# Patient Record
Sex: Male | Born: 1970 | Race: White | Hispanic: No | Smoking: Former smoker
Health system: Southern US, Community
[De-identification: ages and names within clinical notes are randomized; demographics above are authoritative.]

## PROBLEM LIST (undated history)

## (undated) DIAGNOSIS — K56609 Unspecified intestinal obstruction, unspecified as to partial versus complete obstruction: Secondary | ICD-10-CM

## (undated) DIAGNOSIS — K5792 Diverticulitis of intestine, part unspecified, without perforation or abscess without bleeding: Secondary | ICD-10-CM

## (undated) DIAGNOSIS — M359 Systemic involvement of connective tissue, unspecified: Secondary | ICD-10-CM

## (undated) DIAGNOSIS — Z8719 Personal history of other diseases of the digestive system: Secondary | ICD-10-CM

## (undated) HISTORY — PX: CHOLECYSTECTOMY: SHX55

## (undated) HISTORY — PX: OTHER SURGICAL HISTORY: SHX169

## (undated) HISTORY — PX: COLON RESECTION: SHX5231

---

## 1999-09-18 ENCOUNTER — Encounter (INDEPENDENT_AMBULATORY_CARE_PROVIDER_SITE_OTHER): Payer: Self-pay | Admitting: Specialist

## 1999-09-18 ENCOUNTER — Ambulatory Visit (HOSPITAL_COMMUNITY): Admission: RE | Admit: 1999-09-18 | Discharge: 1999-09-18 | Payer: Self-pay | Admitting: General Surgery

## 1999-11-11 ENCOUNTER — Encounter: Admission: RE | Admit: 1999-11-11 | Discharge: 1999-11-11 | Payer: Self-pay | Admitting: General Surgery

## 1999-11-11 ENCOUNTER — Encounter (INDEPENDENT_AMBULATORY_CARE_PROVIDER_SITE_OTHER): Payer: Self-pay | Admitting: Specialist

## 1999-11-11 ENCOUNTER — Encounter: Payer: Self-pay | Admitting: General Surgery

## 1999-12-10 ENCOUNTER — Other Ambulatory Visit: Admission: RE | Admit: 1999-12-10 | Discharge: 1999-12-10 | Payer: Self-pay | Admitting: Gastroenterology

## 2000-05-17 ENCOUNTER — Inpatient Hospital Stay (HOSPITAL_COMMUNITY): Admission: EM | Admit: 2000-05-17 | Discharge: 2000-05-18 | Payer: Self-pay | Admitting: Emergency Medicine

## 2000-05-17 ENCOUNTER — Encounter: Payer: Self-pay | Admitting: Emergency Medicine

## 2000-05-18 ENCOUNTER — Encounter: Payer: Self-pay | Admitting: *Deleted

## 2000-11-04 ENCOUNTER — Encounter: Payer: Self-pay | Admitting: Gastroenterology

## 2000-11-04 ENCOUNTER — Inpatient Hospital Stay (HOSPITAL_COMMUNITY): Admission: EM | Admit: 2000-11-04 | Discharge: 2000-11-07 | Payer: Self-pay | Admitting: Gastroenterology

## 2000-11-05 ENCOUNTER — Encounter: Payer: Self-pay | Admitting: Gastroenterology

## 2000-11-29 ENCOUNTER — Encounter (INDEPENDENT_AMBULATORY_CARE_PROVIDER_SITE_OTHER): Payer: Self-pay | Admitting: Specialist

## 2000-11-29 ENCOUNTER — Other Ambulatory Visit: Admission: RE | Admit: 2000-11-29 | Discharge: 2000-11-29 | Payer: Self-pay | Admitting: Gastroenterology

## 2002-12-17 ENCOUNTER — Encounter: Payer: Self-pay | Admitting: Emergency Medicine

## 2002-12-17 ENCOUNTER — Emergency Department (HOSPITAL_COMMUNITY): Admission: EM | Admit: 2002-12-17 | Discharge: 2002-12-17 | Payer: Self-pay | Admitting: Emergency Medicine

## 2002-12-20 ENCOUNTER — Encounter: Payer: Self-pay | Admitting: Gastroenterology

## 2002-12-20 ENCOUNTER — Encounter: Admission: RE | Admit: 2002-12-20 | Discharge: 2002-12-20 | Payer: Self-pay | Admitting: Gastroenterology

## 2002-12-25 ENCOUNTER — Observation Stay (HOSPITAL_COMMUNITY): Admission: RE | Admit: 2002-12-25 | Discharge: 2002-12-26 | Payer: Self-pay | Admitting: General Surgery

## 2002-12-25 ENCOUNTER — Encounter: Payer: Self-pay | Admitting: General Surgery

## 2002-12-25 ENCOUNTER — Encounter (INDEPENDENT_AMBULATORY_CARE_PROVIDER_SITE_OTHER): Payer: Self-pay

## 2003-05-29 ENCOUNTER — Ambulatory Visit (HOSPITAL_COMMUNITY): Admission: RE | Admit: 2003-05-29 | Discharge: 2003-05-29 | Payer: Self-pay | Admitting: Gastroenterology

## 2003-12-26 ENCOUNTER — Encounter: Admission: RE | Admit: 2003-12-26 | Discharge: 2003-12-26 | Payer: Self-pay | Admitting: Gastroenterology

## 2004-02-28 ENCOUNTER — Encounter (INDEPENDENT_AMBULATORY_CARE_PROVIDER_SITE_OTHER): Payer: Self-pay | Admitting: *Deleted

## 2004-02-28 ENCOUNTER — Ambulatory Visit (HOSPITAL_COMMUNITY): Admission: RE | Admit: 2004-02-28 | Discharge: 2004-02-28 | Payer: Self-pay | Admitting: Gastroenterology

## 2004-03-27 ENCOUNTER — Ambulatory Visit (HOSPITAL_COMMUNITY): Admission: RE | Admit: 2004-03-27 | Discharge: 2004-03-27 | Payer: Self-pay | Admitting: Gastroenterology

## 2007-09-02 ENCOUNTER — Emergency Department (HOSPITAL_COMMUNITY): Admission: EM | Admit: 2007-09-02 | Discharge: 2007-09-02 | Payer: Self-pay | Admitting: Emergency Medicine

## 2007-09-07 ENCOUNTER — Ambulatory Visit: Payer: Self-pay | Admitting: Internal Medicine

## 2008-10-09 ENCOUNTER — Encounter: Admission: RE | Admit: 2008-10-09 | Discharge: 2008-10-09 | Payer: Self-pay | Admitting: Gastroenterology

## 2009-03-22 DIAGNOSIS — R109 Unspecified abdominal pain: Secondary | ICD-10-CM

## 2009-03-22 HISTORY — DX: Unspecified abdominal pain: R10.9

## 2009-03-22 HISTORY — PX: OTHER SURGICAL HISTORY: SHX169

## 2009-03-22 HISTORY — PX: COLON SURGERY: SHX602

## 2009-06-14 ENCOUNTER — Inpatient Hospital Stay (HOSPITAL_COMMUNITY): Admission: EM | Admit: 2009-06-14 | Discharge: 2009-06-18 | Payer: Self-pay | Admitting: Emergency Medicine

## 2009-07-19 ENCOUNTER — Encounter (INDEPENDENT_AMBULATORY_CARE_PROVIDER_SITE_OTHER): Payer: Self-pay

## 2009-08-14 ENCOUNTER — Inpatient Hospital Stay (HOSPITAL_COMMUNITY): Admission: EM | Admit: 2009-08-14 | Discharge: 2009-08-18 | Payer: Self-pay | Admitting: Emergency Medicine

## 2009-08-27 ENCOUNTER — Ambulatory Visit (HOSPITAL_COMMUNITY): Admission: RE | Admit: 2009-08-27 | Discharge: 2009-08-27 | Payer: Self-pay | Admitting: General Surgery

## 2009-09-08 ENCOUNTER — Encounter (INDEPENDENT_AMBULATORY_CARE_PROVIDER_SITE_OTHER): Payer: Self-pay | Admitting: General Surgery

## 2009-09-08 ENCOUNTER — Inpatient Hospital Stay (HOSPITAL_COMMUNITY): Admission: RE | Admit: 2009-09-08 | Discharge: 2009-09-12 | Payer: Self-pay | Admitting: General Surgery

## 2009-09-25 ENCOUNTER — Encounter: Admission: RE | Admit: 2009-09-25 | Discharge: 2009-09-25 | Payer: Self-pay | Admitting: General Surgery

## 2009-10-23 ENCOUNTER — Encounter: Admission: RE | Admit: 2009-10-23 | Discharge: 2009-10-23 | Payer: Self-pay | Admitting: General Surgery

## 2009-11-20 ENCOUNTER — Inpatient Hospital Stay (HOSPITAL_COMMUNITY): Admission: RE | Admit: 2009-11-20 | Discharge: 2009-11-22 | Payer: Self-pay | Admitting: General Surgery

## 2009-12-17 ENCOUNTER — Encounter: Admission: RE | Admit: 2009-12-17 | Discharge: 2009-12-17 | Payer: Self-pay | Admitting: General Surgery

## 2010-02-26 ENCOUNTER — Inpatient Hospital Stay (HOSPITAL_COMMUNITY): Admission: EM | Admit: 2010-02-26 | Discharge: 2009-07-28 | Payer: Self-pay | Admitting: Emergency Medicine

## 2010-04-12 ENCOUNTER — Encounter: Payer: Self-pay | Admitting: General Surgery

## 2010-06-04 LAB — CBC
HCT: 44.2 % (ref 39.0–52.0)
Hemoglobin: 15 g/dL (ref 13.0–17.0)
MCH: 31.3 pg (ref 26.0–34.0)
MCHC: 33.9 g/dL (ref 30.0–36.0)
MCV: 92.1 fL (ref 78.0–100.0)
Platelets: 299 10*3/uL (ref 150–400)
RBC: 4.8 MIL/uL (ref 4.22–5.81)
RDW: 14.2 % (ref 11.5–15.5)
WBC: 7.7 10*3/uL (ref 4.0–10.5)

## 2010-06-04 LAB — DIFFERENTIAL
Basophils Absolute: 0 10*3/uL (ref 0.0–0.1)
Basophils Relative: 0 % (ref 0–1)
Eosinophils Absolute: 0.4 10*3/uL (ref 0.0–0.7)
Eosinophils Relative: 6 % — ABNORMAL HIGH (ref 0–5)
Lymphocytes Relative: 29 % (ref 12–46)
Lymphs Abs: 2.3 10*3/uL (ref 0.7–4.0)
Monocytes Absolute: 0.8 10*3/uL (ref 0.1–1.0)
Monocytes Relative: 11 % (ref 3–12)
Neutro Abs: 4.1 10*3/uL (ref 1.7–7.7)
Neutrophils Relative %: 54 % (ref 43–77)

## 2010-06-07 LAB — COMPREHENSIVE METABOLIC PANEL
ALT: 28 U/L (ref 0–53)
BUN: 7 mg/dL (ref 6–23)
CO2: 27 mEq/L (ref 19–32)
Calcium: 9.6 mg/dL (ref 8.4–10.5)
Creatinine, Ser: 1.03 mg/dL (ref 0.4–1.5)
GFR calc non Af Amer: >60 mL/min (ref >60)
Glucose, Bld: 98 mg/dL (ref 70–99)

## 2010-06-07 LAB — BASIC METABOLIC PANEL
BUN: 2 mg/dL — ABNORMAL LOW (ref 6–23)
CO2: 27 mEq/L (ref 19–32)
CO2: 27 mEq/L (ref 19–32)
Calcium: 8.4 mg/dL (ref 8.4–10.5)
Chloride: 105 mEq/L (ref 96–112)
GFR calc Af Amer: >60 mL/min (ref >60)
GFR calc non Af Amer: >60 mL/min (ref >60)
Glucose, Bld: 83 mg/dL (ref 70–99)
Potassium: 3.8 mEq/L (ref 3.5–5.1)
Sodium: 135 mEq/L (ref 135–145)

## 2010-06-07 LAB — DIFFERENTIAL
Basophils Absolute: 0 10*3/uL (ref 0.0–0.1)
Basophils Relative: 0 % (ref 0–1)
Eosinophils Absolute: 0.4 10*3/uL (ref 0.0–0.7)
Eosinophils Relative: 5 % (ref 0–5)
Lymphocytes Relative: 27 % (ref 12–46)
Lymphocytes Relative: 4 % — ABNORMAL LOW (ref 12–46)
Lymphs Abs: 0.7 10*3/uL (ref 0.7–4.0)
Lymphs Abs: 1.8 10*3/uL (ref 0.7–4.0)
Monocytes Absolute: 1 10*3/uL (ref 0.1–1.0)
Monocytes Absolute: 1.1 10*3/uL — ABNORMAL HIGH (ref 0.1–1.0)
Monocytes Relative: 11 % (ref 3–12)
Monocytes Relative: 7 % (ref 3–12)
Neutro Abs: 13.9 10*3/uL — ABNORMAL HIGH (ref 1.7–7.7)
Neutrophils Relative %: 57 % (ref 43–77)

## 2010-06-07 LAB — CBC
HCT: 33.6 % — ABNORMAL LOW (ref 39.0–52.0)
HCT: 39.1 % (ref 39.0–52.0)
Hemoglobin: 13.4 g/dL (ref 13.0–17.0)
Hemoglobin: 13.5 g/dL (ref 13.0–17.0)
MCHC: 34 g/dL (ref 30.0–36.0)
MCHC: 34.3 g/dL (ref 30.0–36.0)
MCHC: 34.4 g/dL (ref 30.0–36.0)
MCV: 92.3 fL (ref 78.0–100.0)
MCV: 93.3 fL (ref 78.0–100.0)
RBC: 4.2 MIL/uL — ABNORMAL LOW (ref 4.22–5.81)
RBC: 4.24 MIL/uL (ref 4.22–5.81)
RDW: 15.5 % (ref 11.5–15.5)

## 2010-06-08 LAB — CBC
HCT: 34.9 % — ABNORMAL LOW (ref 39.0–52.0)
HCT: 35.7 % — ABNORMAL LOW (ref 39.0–52.0)
Hemoglobin: 13.6 g/dL (ref 13.0–17.0)
MCHC: 34.2 g/dL (ref 30.0–36.0)
MCHC: 34.3 g/dL (ref 30.0–36.0)
MCV: 90.8 fL (ref 78.0–100.0)
MCV: 91.2 fL (ref 78.0–100.0)
MCV: 91.9 fL (ref 78.0–100.0)
Platelets: 365 10*3/uL (ref 150–400)
Platelets: 370 10*3/uL (ref 150–400)
Platelets: 378 10*3/uL (ref 150–400)
RBC: 3.65 MIL/uL — ABNORMAL LOW (ref 4.22–5.81)
RBC: 3.83 MIL/uL — ABNORMAL LOW (ref 4.22–5.81)
RDW: 13.7 % (ref 11.5–15.5)
RDW: 13.8 % (ref 11.5–15.5)
WBC: 11.6 10*3/uL — ABNORMAL HIGH (ref 4.0–10.5)
WBC: 7.3 10*3/uL (ref 4.0–10.5)
WBC: 9.3 10*3/uL (ref 4.0–10.5)

## 2010-06-08 LAB — BASIC METABOLIC PANEL
BUN: 5 mg/dL — ABNORMAL LOW (ref 6–23)
BUN: 8 mg/dL (ref 6–23)
CO2: 28 mEq/L (ref 19–32)
Calcium: 8.6 mg/dL (ref 8.4–10.5)
Chloride: 105 mEq/L (ref 96–112)
Creatinine, Ser: 0.84 mg/dL (ref 0.4–1.5)
Creatinine, Ser: 0.95 mg/dL (ref 0.4–1.5)
GFR calc Af Amer: >60 mL/min (ref >60)
GFR calc non Af Amer: >60 mL/min (ref >60)
Potassium: 3.9 mEq/L (ref 3.5–5.1)

## 2010-06-08 LAB — PROTIME-INR
INR: 1.13 (ref 0.00–1.49)
Prothrombin Time: 14.4 seconds (ref 11.6–15.2)

## 2010-06-08 LAB — CULTURE, ROUTINE-ABSCESS

## 2010-06-08 LAB — POCT I-STAT, CHEM 8
BUN: 10 mg/dL (ref 6–23)
Calcium, Ion: 1.17 mmol/L (ref 1.12–1.32)
Chloride: 105 mEq/L (ref 96–112)
Creatinine, Ser: 0.8 mg/dL (ref 0.4–1.5)
Glucose, Bld: 90 mg/dL (ref 70–99)
HCT: 42 % (ref 39.0–52.0)
Hemoglobin: 14.3 g/dL (ref 13.0–17.0)
Potassium: 4.2 meq/L (ref 3.5–5.1)
Sodium: 141 meq/L (ref 135–145)
TCO2: 28 mmol/L (ref 0–100)

## 2010-06-08 LAB — DIFFERENTIAL
Basophils Absolute: 0.1 10*3/uL (ref 0.0–0.1)
Basophils Relative: 1 % (ref 0–1)
Eosinophils Absolute: 0.3 10*3/uL (ref 0.0–0.7)
Monocytes Absolute: 0.9 10*3/uL (ref 0.1–1.0)
Neutro Abs: 11.9 10*3/uL — ABNORMAL HIGH (ref 1.7–7.7)

## 2010-06-08 LAB — URINALYSIS, ROUTINE W REFLEX MICROSCOPIC
Bilirubin Urine: NEGATIVE
Hgb urine dipstick: NEGATIVE
Ketones, ur: 40 mg/dL — AB
Nitrite: NEGATIVE
Urobilinogen, UA: 1 mg/dL (ref 0.0–1.0)
pH: 6.5 (ref 5.0–8.0)

## 2010-06-09 LAB — URINE CULTURE
Colony Count: NO GROWTH
Culture: NO GROWTH

## 2010-06-09 LAB — URINALYSIS, ROUTINE W REFLEX MICROSCOPIC
Glucose, UA: NEGATIVE mg/dL
Hgb urine dipstick: NEGATIVE
Protein, ur: NEGATIVE mg/dL
Specific Gravity, Urine: 1.013 (ref 1.005–1.030)
pH: 8 (ref 5.0–8.0)

## 2010-06-09 LAB — CBC
HCT: 34.4 % — ABNORMAL LOW (ref 39.0–52.0)
HCT: 36.6 % — ABNORMAL LOW (ref 39.0–52.0)
HCT: 37.3 % — ABNORMAL LOW (ref 39.0–52.0)
HCT: 37.6 % — ABNORMAL LOW (ref 39.0–52.0)
HCT: 38 % — ABNORMAL LOW (ref 39.0–52.0)
HCT: 38.4 % — ABNORMAL LOW (ref 39.0–52.0)
Hemoglobin: 11.5 g/dL — ABNORMAL LOW (ref 13.0–17.0)
Hemoglobin: 11.9 g/dL — ABNORMAL LOW (ref 13.0–17.0)
Hemoglobin: 12.5 g/dL — ABNORMAL LOW (ref 13.0–17.0)
Hemoglobin: 12.7 g/dL — ABNORMAL LOW (ref 13.0–17.0)
Hemoglobin: 12.8 g/dL — ABNORMAL LOW (ref 13.0–17.0)
Hemoglobin: 13 g/dL (ref 13.0–17.0)
Hemoglobin: 13.2 g/dL (ref 13.0–17.0)
Hemoglobin: 14.8 g/dL (ref 13.0–17.0)
MCHC: 34 g/dL (ref 30.0–36.0)
MCHC: 34.8 g/dL (ref 30.0–36.0)
MCHC: 34 g/dL (ref 30.0–36.0)
MCHC: 34.2 g/dL (ref 30.0–36.0)
MCHC: 34.3 g/dL (ref 30.0–36.0)
MCHC: 34.4 g/dL (ref 30.0–36.0)
MCV: 93.5 fL (ref 78.0–100.0)
Platelets: 296 10*3/uL (ref 150–400)
Platelets: 397 10*3/uL (ref 150–400)
RBC: 3.6 MIL/uL — ABNORMAL LOW (ref 4.22–5.81)
RBC: 3.72 MIL/uL — ABNORMAL LOW (ref 4.22–5.81)
RBC: 3.73 MIL/uL — ABNORMAL LOW (ref 4.22–5.81)
RBC: 4 MIL/uL — ABNORMAL LOW (ref 4.22–5.81)
RBC: 4.05 MIL/uL — ABNORMAL LOW (ref 4.22–5.81)
RBC: 4.13 MIL/uL — ABNORMAL LOW (ref 4.22–5.81)
RBC: 4.61 MIL/uL (ref 4.22–5.81)
RDW: 13.2 % (ref 11.5–15.5)
RDW: 13.3 % (ref 11.5–15.5)
RDW: 13.7 % (ref 11.5–15.5)
RDW: 13.7 % (ref 11.5–15.5)
RDW: 13.3 % (ref 11.5–15.5)
RDW: 13.3 % (ref 11.5–15.5)
WBC: 10.8 10*3/uL — ABNORMAL HIGH (ref 4.0–10.5)
WBC: 11.1 10*3/uL — ABNORMAL HIGH (ref 4.0–10.5)
WBC: 12.4 10*3/uL — ABNORMAL HIGH (ref 4.0–10.5)

## 2010-06-09 LAB — BASIC METABOLIC PANEL
BUN: 4 mg/dL — ABNORMAL LOW (ref 6–23)
BUN: 4 mg/dL — ABNORMAL LOW (ref 6–23)
BUN: 3 mg/dL — ABNORMAL LOW (ref 6–23)
BUN: 4 mg/dL — ABNORMAL LOW (ref 6–23)
CO2: 27 mEq/L (ref 19–32)
CO2: 30 mEq/L (ref 19–32)
CO2: 26 mEq/L (ref 19–32)
CO2: 29 mEq/L (ref 19–32)
Calcium: 8.3 mg/dL — ABNORMAL LOW (ref 8.4–10.5)
Calcium: 9.1 mg/dL (ref 8.4–10.5)
Calcium: 8.6 mg/dL (ref 8.4–10.5)
Calcium: 8.6 mg/dL (ref 8.4–10.5)
Calcium: 8.8 mg/dL (ref 8.4–10.5)
Chloride: 105 mEq/L (ref 96–112)
Chloride: 104 mEq/L (ref 96–112)
Creatinine, Ser: 0.7 mg/dL (ref 0.4–1.5)
Creatinine, Ser: 0.85 mg/dL (ref 0.4–1.5)
Creatinine, Ser: 0.85 mg/dL (ref 0.4–1.5)
GFR calc Af Amer: >60 mL/min (ref >60)
GFR calc Af Amer: >60 mL/min (ref >60)
GFR calc Af Amer: >60 mL/min (ref >60)
GFR calc Af Amer: >60 mL/min (ref >60)
GFR calc non Af Amer: >60 mL/min (ref >60)
GFR calc non Af Amer: >60 mL/min (ref >60)
GFR calc non Af Amer: >60 mL/min (ref >60)
GFR calc non Af Amer: >60 mL/min (ref >60)
GFR calc non Af Amer: >60 mL/min (ref >60)
Glucose, Bld: 106 mg/dL — ABNORMAL HIGH (ref 70–99)
Glucose, Bld: 96 mg/dL (ref 70–99)
Glucose, Bld: 98 mg/dL (ref 70–99)
Glucose, Bld: 74 mg/dL (ref 70–99)
Glucose, Bld: 80 mg/dL (ref 70–99)
Glucose, Bld: 85 mg/dL (ref 70–99)
Potassium: 3.2 mEq/L — ABNORMAL LOW (ref 3.5–5.1)
Potassium: 3.4 mEq/L — ABNORMAL LOW (ref 3.5–5.1)
Potassium: 3.6 mEq/L (ref 3.5–5.1)
Potassium: 4 mEq/L (ref 3.5–5.1)
Potassium: 4 mEq/L (ref 3.5–5.1)
Sodium: 139 mEq/L (ref 135–145)
Sodium: 141 mEq/L (ref 135–145)
Sodium: 136 mEq/L (ref 135–145)
Sodium: 139 mEq/L (ref 135–145)

## 2010-06-09 LAB — DIFFERENTIAL
Basophils Absolute: 0.2 10*3/uL — ABNORMAL HIGH (ref 0.0–0.1)
Basophils Relative: 1 % (ref 0–1)
Eosinophils Absolute: 0.3 10*3/uL (ref 0.0–0.7)
Monocytes Absolute: 1.8 10*3/uL — ABNORMAL HIGH (ref 0.1–1.0)
Monocytes Relative: 12 % (ref 3–12)
Neutro Abs: 11.2 10*3/uL — ABNORMAL HIGH (ref 1.7–7.7)
Neutrophils Relative %: 73 % (ref 43–77)

## 2010-06-09 LAB — COMPREHENSIVE METABOLIC PANEL
Albumin: 3.5 g/dL (ref 3.5–5.2)
BUN: 8 mg/dL (ref 6–23)
Calcium: 8.9 mg/dL (ref 8.4–10.5)
Creatinine, Ser: 1.13 mg/dL (ref 0.4–1.5)
Glucose, Bld: 92 mg/dL (ref 70–99)
Potassium: 3.9 mEq/L (ref 3.5–5.1)
Total Protein: 7.8 g/dL (ref 6.0–8.3)

## 2010-06-09 LAB — ABO/RH: ABO/RH(D): O POS

## 2010-06-09 LAB — POCT I-STAT, CHEM 8
BUN: 9 mg/dL (ref 6–23)
Chloride: 102 mEq/L (ref 96–112)
Creatinine, Ser: 1.2 mg/dL (ref 0.4–1.5)
Potassium: 4.2 mEq/L (ref 3.5–5.1)
Sodium: 138 mEq/L (ref 135–145)
TCO2: 28 mmol/L (ref 0–100)

## 2010-06-09 LAB — LIPASE, BLOOD: Lipase: 15 U/L (ref 11–59)

## 2010-06-09 LAB — GLUCOSE, CAPILLARY

## 2010-06-09 LAB — PHOSPHORUS: Phosphorus: 2.5 mg/dL (ref 2.3–4.6)

## 2010-06-09 LAB — MAGNESIUM: Magnesium: 1.7 mg/dL (ref 1.5–2.5)

## 2010-06-12 LAB — COMPREHENSIVE METABOLIC PANEL
ALT: 21 U/L (ref 0–53)
Alkaline Phosphatase: 52 U/L (ref 39–117)
CO2: 25 mEq/L (ref 19–32)
GFR calc non Af Amer: 58 mL/min — ABNORMAL LOW (ref >60)
Glucose, Bld: 95 mg/dL (ref 70–99)
Potassium: 3.7 mEq/L (ref 3.5–5.1)
Sodium: 134 mEq/L — ABNORMAL LOW (ref 135–145)
Total Protein: 7.7 g/dL (ref 6.0–8.3)

## 2010-06-12 LAB — CBC
HCT: 35.3 % — ABNORMAL LOW (ref 39.0–52.0)
HCT: 35.6 % — ABNORMAL LOW (ref 39.0–52.0)
HCT: 40.9 % (ref 39.0–52.0)
Hemoglobin: 12.3 g/dL — ABNORMAL LOW (ref 13.0–17.0)
Hemoglobin: 15.9 g/dL (ref 13.0–17.0)
MCHC: 34.2 g/dL (ref 30.0–36.0)
MCHC: 34.5 g/dL (ref 30.0–36.0)
MCV: 93.8 fL (ref 78.0–100.0)
MCV: 94.6 fL (ref 78.0–100.0)
Platelets: 165 10*3/uL (ref 150–400)
Platelets: 188 10*3/uL (ref 150–400)
Platelets: 203 10*3/uL (ref 150–400)
Platelets: 221 10*3/uL (ref 150–400)
RBC: 3.58 MIL/uL — ABNORMAL LOW (ref 4.22–5.81)
RBC: 4.89 MIL/uL (ref 4.22–5.81)
RDW: 13.4 % (ref 11.5–15.5)
RDW: 13.6 % (ref 11.5–15.5)
RDW: 13.6 % (ref 11.5–15.5)
WBC: 10.8 10*3/uL — ABNORMAL HIGH (ref 4.0–10.5)
WBC: 16.5 10*3/uL — ABNORMAL HIGH (ref 4.0–10.5)

## 2010-06-12 LAB — URINALYSIS, ROUTINE W REFLEX MICROSCOPIC
Bilirubin Urine: NEGATIVE
Glucose, UA: NEGATIVE mg/dL
Ketones, ur: NEGATIVE mg/dL
Leukocytes, UA: NEGATIVE
Protein, ur: 30 mg/dL — AB

## 2010-06-12 LAB — GIARDIA/CRYPTOSPORIDIUM SCREEN(EIA)
Cryptosporidium Screen (EIA): NEGATIVE
Giardia Screen - EIA: NEGATIVE

## 2010-06-12 LAB — DIFFERENTIAL
Basophils Relative: 0 % (ref 0–1)
Eosinophils Absolute: 0 10*3/uL (ref 0.0–0.7)
Monocytes Relative: 12 % (ref 3–12)
Neutrophils Relative %: 80 % — ABNORMAL HIGH (ref 43–77)

## 2010-06-12 LAB — BASIC METABOLIC PANEL
BUN: 9 mg/dL (ref 6–23)
CO2: 24 mEq/L (ref 19–32)
Calcium: 8.2 mg/dL — ABNORMAL LOW (ref 8.4–10.5)
GFR calc non Af Amer: 55 mL/min — ABNORMAL LOW (ref >60)
Glucose, Bld: 92 mg/dL (ref 70–99)

## 2010-06-12 LAB — URINE CULTURE
Colony Count: NO GROWTH
Colony Count: NO GROWTH
Culture: NO GROWTH
Culture: NO GROWTH
Special Requests: NEGATIVE

## 2010-06-12 LAB — CLOSTRIDIUM DIFFICILE EIA: C difficile Toxins A+B, EIA: NEGATIVE

## 2010-06-12 LAB — PHOSPHORUS: Phosphorus: 1.7 mg/dL — ABNORMAL LOW (ref 2.3–4.6)

## 2010-06-12 LAB — CULTURE, ROUTINE-ABSCESS

## 2010-06-12 LAB — CULTURE, BLOOD (ROUTINE X 2)

## 2010-06-12 LAB — ANAEROBIC CULTURE

## 2010-06-12 LAB — CULTURE, BLOOD (SINGLE)

## 2010-06-12 LAB — HEMOCCULT GUIAC POC 1CARD (OFFICE): Fecal Occult Bld: NEGATIVE

## 2010-08-04 NOTE — H&P (Signed)
NAMEJAMAAL, BERNASCONI                  ACCOUNT NO.:  [id number]   MEDICAL RECORD NO.:  [id number]          PATIENT TYPE:  EMS   LOCATION:  ED                           FACILITY:  [facility]   PHYSICIAN:  Iva Boop, MD,FACGDATE OF BIRTH:  [birth year]   DATE OF ADMISSION:  09/02/2007  DATE OF DISCHARGE:                              HISTORY & PHYSICAL   CHIEF COMPLAINT:  Abdominal pain.   Mr. Poage is a 40 year old white man followed by Dr. Loreta Ave  intermittently for colon polyps and irritable bowel syndrome-type  problems it sounds like.  Within the last 12 hours or so, he has  developed suprapubic and left lower quadrant and inguinal-type pain,  quite severe.  He has had some diarrhea as well.  The patient describes  similar problems over the years with evaluations that were unrevealing.  I have reviewed previous colonoscopy reports and random biopsies have  been unremarkable.  The pain is quite intense, coming and going.  There  is minimal change in urinary stream, but no dysuria.  Some nausea, no  vomiting, and no fever.  It is similar to previous problems, but it has  been a number of years that he has had it in such a severe fashion.  He  has been admitted for it before, but it is has been several years at  least.  He has felt somewhat warm, but there is no real fever noted.  He  had a scanty amount of rectal bleeding today, but he has that  chronically and intermittently due to anal fissure he tells me.   PAST MEDICAL HISTORY:  1. Questionable IBS.  2. Colon polyps.  3. History of shingles.   MEDICATIONS:  None.  There are no known drug allergies.   PAST SURGICAL HISTORY:  1. Anal fissure surgery.  2. Hemorrhoidectomy.  3. Cholecystectomy.  4. Left elbow surgery.   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   REVIEW OF SYSTEMS:  As above.  He has a rash around his ears, which  comes and goes and is flaring lately.  All other systems are negative.  Note, there were no sick contacts.   PHYSICAL EXAMINATION:  GENERAL:  A well-developed, obese, white man in  no acute distress, though he does appear mildly ill.  VITAL SIGNS:  Temperature 97.2 and 97.9, blood pressure 133/81 and  110/75 in the right and left arms respectively with pulse 82 and 67,  which is regular, respirations 20.  Room air saturation is 98%.  EYES:  Anicteric.  NECK:  Supple.  LUNGS:  Clear.  HEART:  S1 S2, no rubs, murmurs, or gallops.  BACK:  Some minimal CVA tenderness, right greater than left.  ABDOMEN:  Obese, soft, he is fairly tender in the suprapubic and left  inguinal areas and left lower quadrant and some in the right.  He seems  to be somewhat hyperesthetic.  There is no rebound or guarding per se.  GENITALIA:  Normal and  minimally tender.  The perianal area is nontender  without any sign of an abscess.  He refused a rectal exam.  LOWER EXTREMITIES:  Free of edema.  NEURO:  He is alert and oriented x3.   LABORATORY DATA:  White count 13.9, hemoglobin 15.8, platelet count 240.  His glucose is 102.  Otherwise, a BMET is normal.  LFTs are normal.  Amylase and lipase normal.  Urinalysis negative.  CT scan of the abdomen  and pelvis with contrast demonstrates wall thickening in the  rectosigmoid area with stranding in adjacent fat and associated  diverticulosis, but no abscess.  Acute abdominal series was  unremarkable.  The finding of the radiologist is the findings are most  consistent with acute diverticulitis.   ASSESSMENT:  Abdominal pain, abnormal rectosigmoid associated with  diverticulosis, suspected diverticulitis.  Typically, you do not get  diarrhea with that, but that is possible.  The other possibility is some  sort of proctosigmoiditis.  I have personally reviewed the computerized  tomography scan.  Working diagnosis is acute diverticulitis.  Plan will  be for Cipro and  Flagyl 500 mg b.i.d. x10 days, oxycodone 10 mg 1 every  4 to 6 hours p.r.n., #20 prescribed, and he is to call Dr. Kenna Gilbert office  on Monday to arrange followup in the next week or so.      Iva Boop, MD,FACG  Electronically Signed     CEG/MEDQ  D:  09/02/2007  T:  09/02/2007  Job:  [id number]   cc:   Anselmo Rod, M.D.  Fax: [phone]

## 2010-08-04 NOTE — H&P (Signed)
NAMESEARS, ORAN                  ACCOUNT NO.:  [id number]   MEDICAL RECORD NO.:  [id number]          PATIENT TYPE:  EMS   LOCATION:  ED                           FACILITY:  [facility]   PHYSICIAN:  Iva Boop, MD,FACGDATE OF BIRTH:  February 21, 1971   DATE OF ADMISSION:  09/02/2007  DATE OF DISCHARGE:                              HISTORY & PHYSICAL   ADDENDUM:  He did receive some IV Toradol after IV Dilaudid in the  emergency department.  The Toradol seemed to help him quite a bit.  He  has requested some p.o. Toradol.  I think that is reasonable, so will  prescribe some of that as well for a limited supply, #10 pills.      Iva Boop, MD,FACG  Electronically Signed     CEG/MEDQ  D:  09/02/2007  T:  09/02/2007  Job:  [id number]   cc:   Anselmo Rod, M.D.  Fax: [phone]

## 2010-08-07 NOTE — Discharge Summary (Signed)
[facility]  Patient:    [name], [name] Visit Number: [id number] MRN: [id number]          Service Type: MED Location: 3W [id number] Attending Physician:  Charmaine Downs Dictated by:   Mike Gip, P.A.C. Adm. Date:  11/04/2000 Disc. Date: 11/07/2000                             Discharge Summary  ADMISSION DIAGNOSES: 87. A 40 year old white male with acute severe lower abdominal/pelvic and    suprapubic pain radiating to the perineum and rectum x 4 days associated    with severe diarrhea and intermittent hematochezia, rule out underlying    inflammatory bowel disease with development of fistulization or pelvic    abscess, rule out other acute lower abdominal inflammatory process. 2. Irritable bowel syndrome.  DISCHARGE DIAGNOSIS:  Acute inflammatory process of sigmoid colon with pericolonic fat inflammation, rule out secondary to acute diverticulitis or focal colitis.  PROCEDURE:  CT scan of the abdomen and pelvis.  CONSULTATIONS:  None.  HISTORY OF PRESENT ILLNESS:  [name] is a 40 year old white male known to Dr. 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  He had undergone colonoscopy in September 2001, for complaints of lower abdominal pain and diarrhea with intermittent rectal bleeding.  This was a negative exam.  He did have several random biopsies taken, all of which were negative.  He also has a prior history of an anal fissure which required a fissure repair and hemorrhoidectomy per Dr. Earlene Plater in June 2001.  He is otherwise in good health.  He says that he has had acute periodic episodes of abdominal pain and diarrhea for a couple of years.  We actually have not seen him in the office at this point for several months.  He had been entered in an IBS drug study, but not have any benefit from that medication.  The patient says that he has been doing well over the past several months with just  occasional episodes of diarrhea occurring two to three times per week, and having normal bowel movements in between.  He says he also continues to have occasional small volume bright red blood per rectum, but no recent abdominal pain.  His weight has been stable.  At this time, he had abrupt onset on Monday, August 12, with excruciating lower abdominal pain which is in the suprapubic area, left lower quadrant, radiating to the perineum and internally to his rectum.  He says this pain has progressed over the past four days, and is currently unable to walk without rectal shooting pain.  He has also developed some pressure with urination, though no hematuria, no documented fever, chills, or sweats, and his appetite has been fine.  He says he has lost about 8 pounds since Monday due to onset of profuse diarrhea with 8 to 10 bowel movements per day.  He has also had continued intermittent hematochezia.  He has had some nausea from pain, but no vomiting.  He denied any recent antibiotics or any other medications, no travel, etc.  He was seen and evaluated in the office as an acute add on, felt to be acutely ill, and admitted to the hospital for pain control and further diagnostic workup.  LABORATORY DATA:  On admission, 8/16, white blood cell count was 12.1, hemoglobin 15.1, hematocrit 42.9, MCV 90.5, platelets 265.  Followup on 8/17, white blood cell count 8.9,  hemoglobin 15.3, hematocrit 44.0.  Sedimentation rate was 17.  Prothrombin time 13.1, INR 1.0, PTT 32.  Electrolytes within normal limits.  BUN 12, creatinine 1.2, albumin 4.  Liver function studies normal.  Albumin 4.1.  Urinalysis negative.  Anti-gliadin and anti-endomesial antibodies were sent off, these are still pending.  X-ray studies:  CT scan of the abdomen and pelvis on 11/04/00, initially read as negative.  Small-bowel follow-through was then ordered on 8/17, which did not show any definite small bowel abnormality.  The CT  scan was then reviewed, and felt to show sigmoid colon mural thickening associated with some soft tissue stranding in the adjacent fat in the left hemipelvis, suggestive of diverticulitis or focal colitis.  There was no evidence for pelvic abscess.  HOSPITAL COURSE:  The patient was admitted to the service of Dr. Victorino Dike. He was initially placed on a clear liquid diet.  CT scan of the abdomen and pelvis were ordered.  He was given Demerol and Phenergan for control of pain and nausea, and started on IV Unasyn.  The CT scan was initially read as negative.  By the following morning, he was continuing to complain of fairly severe pain, and Dr. Loreta Ave who was covering, decided on a small-bowel follow-through again to rule out any evidence of inflammatory bowel disease. This was done, and was negative.  By 8/18, he was feeling somewhat better with decreased pain level, though still having ongoing pain.  He also had refused to take any pain medications, as he said it changes his personality.  His diet was cautiously advanced.  He seemed to tolerate this without any difficulty, and by 8/19, was actually tolerating a low residue diet.  CT was reviewed and amended at the time of small-bowel follow-through as outlined above.  He was felt to have a sigmoid colitis versus diverticulitis.  By 8/19, he was feeling minimally improved with the pain level from 10 initially down to a 6, still complaining of some discomfort with ambulation, no further diarrhea.  He was insistent on discharge to home on the afternoon of 11/07/00, as a family member was having surgery the following day, and he was responsible for caring for his children, though we had discussed proceeding with barium enema today which he refused, and said that he would have to be knocked out for any further procedures.  Nevertheless, it was felt that he was stable, and was allowed discharge to home with instructions to follow up with Dr. Victorino Dike on August 30, at 3:15 p.m., to take it easy at home.  He was given a work excuse for the remainder of this week, as he has a very physically active job.  He  was to follow a low residue diet.  DISCHARGE MEDICATIONS: 1. Cipro 500 mg b.i.d. x 7 days. 2. Flagyl 500 mg b.i.d. x 7 days. 3. Extra Strength Tylenol p.r.n.  He was to call us in the interim if his symptoms worsened, and we would proceed with further workup. Dictated by:   Mike Gip, P.A.C. Attending Physician:  Charmaine Downs DD:  11/07/00 TD:  11/08/00 Job: [id number]

## 2010-08-07 NOTE — Op Note (Signed)
NAME:  [name], [name]                            ACCOUNT NO.:  [id number]   MEDICAL RECORD NO.:  [id number]                   PATIENT TYPE:  AMB   LOCATION:  ENDO                                 FACILITY:  [facility]   PHYSICIAN:  Anselmo Rod, M.D.               DATE OF BIRTH:  01/23/1971   DATE OF PROCEDURE:  05/29/2003  DATE OF DISCHARGE:  05/29/2003                                 OPERATIVE REPORT   PROCEDURE PERFORMED:  Esophagogastroduodenoscopy.   ENDOSCOPIST:  Anselmo Rod, M.D.   INSTRUMENT USED:  Olympus video panendoscope.   INDICATION FOR PROCEDURE:  A 40 year old white male with a history of severe  epigastric pain not improved after a laparoscopic cholecystectomy done for  dyskinesia.  Rule out peptic ulcer disease.  The patient also had guaiac-  positive stools on physical exam in the office.   PREPROCEDURE PREPARATION:  VITAL SIGNS:  The patient had stable vital signs.  NECK:  Supple.  CHEST:  Clear to auscultation.  S1, S2 regular.  ABDOMEN:  Soft with laparoscopic cholecystectomy scars present.  Epigastric  tenderness appreciated with guarding, no rebound or rigidity.  No  hepatosplenomegaly.   DESCRIPTION OF PROCEDURE:  The patient was placed in the left lateral  decubitus position and sedated with 60 mg of Demerol and 7 mg of Versed  intravenously.  Once the patient was adequately sedate and maintained on low-  flow oxygen and continuous cardiac monitoring, the Olympus video  panendoscope was advanced with the mouthpiece over the tongue into the  esophagus under direct vision.  The entire esophagus appeared normal with no  evidence of ring, stricture, masses, esophagitis, or Barrett's mucosa.  The  scope was then advanced into the stomach.  Diffuse gastritis was noted with  a small hiatal hernia on high retroflexion.  Duodenitis was noted in the  duodenal bulb.  Small bowel distal to the bulb appeared normal up to 60 cm.  The patient tolerated the procedure  well without complications.   IMPRESSION:  1. Hemorrhagic duodenitis.  2. Diffuse gastritis.  3. Small hiatal hernia.  4. Normal-appearing esophagus.  5. Normal small bowel distal to the bowel up to 60 cm.   RECOMMENDATIONS:  1. Protonix 40 mg one p.o. daily has been prescribed for the patient.  2. Avoid nonsteroidals including Goody Powders.  3. Trial of cholestyramine 4 g b.i.d. for post-cholecystectomy diarrhea.  4. Outpatient follow-up in the next two weeks or earlier if need be.                                               Anselmo Rod, M.D.    JNM/MEDQ  D:  05/31/2003  T:  06/01/2003  Job:  [id number]   cc:   Sheppard Plumber.  Earlene Plater, M.D.  [address] [place]  [place] [address]  Fax: [phone]

## 2010-08-07 NOTE — Op Note (Signed)
NAME:  [name], FRIEDEL                  ACCOUNT NO.:  [id number]   MEDICAL RECORD NO.:  [id number]          PATIENT TYPE:  AMB   LOCATION:  ENDO                         FACILITY:  [facility]   PHYSICIAN:  Anselmo Rod, M.D.  DATE OF BIRTH:  06-28-1970   DATE OF PROCEDURE:  03/27/2004  DATE OF DISCHARGE:                                 OPERATIVE REPORT   PROCEDURE PERFORMED:  Esophagogastroduodenoscopy.   ENDOSCOPIST:  Anselmo Rod, M.D.   INSTRUMENT USED:  Olympus video panendoscope.   INDICATIONS FOR PROCEDURE:  Epigastric pain with rectal bleeding in a 40-  year-old white male to rule out peptic ulcer disease, esophagitis,  gastritis, etc.   PREPROCEDURE PREPARATION:  Informed consent was procured from the patient.  The patient fasted for eight hours prior to the procedure and prepped with a  bottle of Magnesium Citrate and a gallon of GoLYTELY the night prior to the  procedure.   PREPROCEDURE PHYSICAL EXAMINATION:  VITAL SIGNS:  Stable.  NECK:  Supple.  CHEST:  Clear to auscultation.  CARDIOVASCULAR:  S1 and S2 regular.  ABDOMEN:  Soft with normal bowel sounds.   DESCRIPTION OF PROCEDURE:  The patient was placed in left lateral decubitus  position, sedated with 40 mg of Demerol and 4  mg of Versed in slow  incremental doses.  Once the patient was adequately sedated and maintained  on low flow oxygen and continuous cardiac monitoring, the Olympus video  panendoscope was advanced through the mouthpiece over the tongue and into  the esophagus under direct vision.  The entire esophagus appeared normal  with no evidence of ring, stricture, masses, esophagitis or Barrett's  mucosa.  The scope was then advanced in the stomach.  Entire gastric mucosa  and the proximal small-bowel appeared normal.  Retroflexion in the high  cardia revealed no abnormalities.   IMPRESSION:  Normal EGD.   RECOMMENDATIONS:  1.  Proceed with colonoscopy at this time to further evaluate the patient's    rectal bleeding.  Further recommendations will be made thereafter.  2.  Continue Protonix as discussed with the patient prior to the procedure.  3.  Avoid all nonsteroidals and follow antireflux measures.  4.  Further recommendations will be made after colonoscopy has been done.      Jyot   JNM/MEDQ  D:  03/27/2004  T:  03/27/2004  Job:  [id number]

## 2010-08-07 NOTE — Discharge Summary (Signed)
[place]  Patient:    [name], [name]                         MRN: [id number] Adm. Date:  [id number] Disc. Date: 05/18/00 Attending:  Alric Quan Dictator:   Abelino Derrick, P.A.C. [facility]                           Discharge Summary  DISCHARGE DIAGNOSES:  1. Chest pain of unclear etiology.  2. History of irritable bowel syndrome.  HISTORY OF PRESENT ILLNESS: The patient is a 40 year old male, former heavy smoker, who has been evaluated for irritable bowel syndrome by Dr. Victorino Dike.  He was on a study drug for this but developed some palpitations. The day of admission, May 17, 2000, he developed tight substernal chest pain while having coffee at home.  He was under stress.  HOSPITAL COURSE: He was admitted to telemetry for further evaluation.  Drug screen was positive for benzodiazepines.  CK was slightly elevated but MBs were negative.  Troponin was negative.  He has a strong family history of coronary disease, his mother had a bypass in her 83s.  Exercise Cardiolite study was obtained which was negative for ischemia with EF of 61%.  Spiral CT was obtained and showed no evidence of pulmonary embolus.  D-dimer, amylase, lipase, and TSH are all pending at the time of this dictation.  DISCHARGE MEDICATIONS: The patient is discharged on no medications.  FOLLOW-UP: He will follow up with [place] in about a week, with P.A.  LABORATORY DATA: Drug screen was positive, as noted, for benzodiazepines.  INR 0.9.  Lipid profile is pending.  TSH is pending.  CK 237, MB 1.6.  Sodium 139, potassium 3.8, BUN 17, creatinine 1.3.  WBC 7.2, hemoglobin 15.7, hematocrit 46.4; platelets 288,000.  Urinalysis unremarkable.  EKG shows sinus rhythm without acute changes.  DISCHARGE CONDITION: Stable.  FOLLOW-UP: The patient will follow up with [place] next week.  I explained to him and his wife that we were actually not able to pinpoint the etiology of his chest  pain although on talking with him at discharge I wonder if it was not esophageal spasm.  I did reassure him that it looked that it was not from his heart or from an aneurysm or from a blood clot to his lungs and that it was all reassuring.  Dr. Victorino Dike has evaluated him from a GI standpoint recently also.  DD:  05/18/00 TD:  05/19/00 Job: [id number]

## 2010-08-07 NOTE — H&P (Signed)
[facility]  Patient:    George, [name] Visit Number: [id number] MRN: [id number]          Service Type: Attending:  Ulyess Mort, M.D. [facility] Dictated by:   Mike Gip, P.A.C. Adm. Date:  11/04/00                           History and Physical  DATE OF BIRTH:  06/26/1970  CHIEF COMPLAINT:  Severe lower abdominal pain, perineal pain, and severe diarrhea x 4 days.  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  He had negative colonoscopy in September of 2001 which was done per Dr. Victorino Dike for complaints of lower abdominal pain and diarrhea with intermittent rectal bleeding.  Colonoscopy was negative.  He did have biopsies taken of his small bowel, terminal ileum, and colon, all of which were negative.  The patient also has a prior history of an anal fissure and required fissure repair and hemorrhoidectomy per Dr. Earlene Plater in June of 2001. He has otherwise been in good health.  The patient has had periodic rather acute episodes of abdominal pain and diarrhea.  We actually have not seen him for several months.  He was entered in a drug study through the office with _________ for diarrhea predominant IBS; however, he had no benefit from that drug and actually developed some chest pain while he was in the study and had to undergo cardiac evaluation, all of which was negative.  The patient says he has been doing well over the past several months with just occasional episodes of diarrhea, perhaps 2-3 times per week.  He also notes occasional small volume bright red blood per rectum, but had not been having any abdominal pain.  He says in between his episodes of diarrhea, he was having normal bowel movements.  His weight had been remaining stable.  Now with abrupt onset on Monday, August 12, with excruciating lower abdominal pain which is  in his suprapubic area and left lower quadrant radiating to the perineal area and internally to his rectum.  He says this pain has progressed over the past four days.  He is currently unable to walk without a sensation of pressure on his rectum and pain shooting into the suprapubic area.  This is a constant pain.  He has also developed some pressure with urination, thought no hematuria or air via the bladder.  He has not had any documented fever, chills, or sweats.  His appetite has been okay; however, he has lost about eight pounds since Monday due to onset of profuse diarrhea, with 8-10 loose bowel movements per day.  This has been associated with intermittent hematochezia as well.  He says he has been nauseated from pain, but not having any vomiting.  He has not had any recent antibiotics or other medications.  No travel, etc.  The patient was seen and evaluated in the office this afternoon as an acute add-on.  He was felt to be acutely ill and admitted to the hospital for pain control and further diagnostic workup with concerns for possible underlying inflammatory bowel disease with fistulization, pelvic abscess, etc.  CURRENT MEDICATIONS:  None on a regular basis.  He was called in [employer] a couple of days ago without benefit.  Also called in _________ suppositories, and ELA-Max ointment p.r.n.  ALLERGIES:  No known  drug allergies.  PAST HISTORY: 1. Status post anal fissure repair and hemorrhoidectomy, June 2001, per    Dr. Kendrick Ranch. 2. IBS.  SOCIAL HISTORY:  The patient is married.  He is employed as a Retail banker for [employer].  He is very active physically with his job. He is a smoker, 1 to 1-1/2 packs per day, and uses alcohol socially.  FAMILY HISTORY:  Negative for GI disease, specifically no inflammatory bowel disease, colon cancers, polyps, etc.  REVIEW OF SYSTEMS:  CARDIOVASCULAR:  Denies any chest pain or anginal symptoms.  PULMONARY:   Negative for cough, shortness of breath, sputum production.  GENITOURINARY:  As above.  GASTROINTESTINAL:  As above.  PHYSICAL EXAMINATION:  GENERAL:  Well-developed, healthy-appearing white male who is in pain.  He is alert and oriented x 3.  VITAL SIGNS:  Blood pressure 100/74, pulse is 74, weight is 174 pounds, temperature is 99.  HEENT:  Atraumatic, normocephalic, EOMI, PERRLA.  Sclerae anicteric.  NECK:  Supple without nodes.  No JVD or bruit.  CARDIOVASCULAR:  Regular rate and rhythm with S1 and S2, no murmur, rub, or gallop.  PULMONARY:  Clear to A&P.  ABDOMEN:  Soft.  Bowel sounds are active.  He is quite tender in the suprapubic area and left lower quadrant.  There is some guarding and early rebound.  There is no fluctuance.  No palpable mass or hepatosplenomegaly.  RECTAL:  The patient would not permit rectal exam; however, external exam revealed no evidence of obvious perirectal abscess erythema, perineal erythema, or fistulization.  He is nontender to pressure externally of the anus, rectum, and perineum.  EXTREMITIES:  Without clubbing, cyanosis, or edema.  NEUROLOGIC:  Grossly nonfocal.  LABORATORY STUDIES:  CBC done in the office today shows a WBC of 11.5, hemoglobin 15.6, hematocrit of 44.6 and MCV of 90.7.  IMPRESSION: 45. A 39 year old white male with progressive acute lower abdominal/pelvic and    suprapubic pain radiating to the perineum and rectum x 4 days.  This has    been associated with severe diarrhea and intermittent hematochezia, rule    out underlying inflammatory bowel disease with development of fistulization    and/or pelvic abscess, perirectal abscess, etc.  Rule out other acute lower    abdominal inflammatory process. 2. Irritable bowel syndrome.  PLAN:  The patient is admitted to the service of Dr. Victorino Dike for IV fluid hydration, pain control, will be covered with IV Unasyn.  Will check baseline labs.  Schedule for stat CT scan of  the abdomen, pelvis, and further plans  depending on findings of CT.  He may require surgical consultation. DD:  11/04/00 TD:  11/05/00 Job: [id number]

## 2010-08-07 NOTE — Op Note (Signed)
[facility]  Patient:    [name], [name]                         MRN: [id number] Proc. Date: 09/18/99 Adm. Date:  [id number] Attending:  Carson Myrtle                           Operative Report  PREOPERATIVE DIAGNOSES:  Anorectal pain and rectal bleeding.  POSTOPERATIVE DIAGNOSES:  Anal fissure and internal/external hemorrhoids.  PROCEDURES:  Examination under anesthesia, proctoscopy, repair of anal fissure and hemorrhoidectomy.  SURGEON:  Kendrick Ranch, M.D.  ANESTHESIA:  General.  HISTORY OF PRESENT ILLNESS:  Mr. Slemmer has had 3 months of rectal pain, 1 month of rectal bleeding. He presented in my office earlier this week and because of the amount of pain and bleeding, he was scheduled urgently. Because I was not able to examine him in the office because of the pain, he agrees and understands to the proposed procedure.  DESCRIPTION OF PROCEDURE:  The patient was brought to the operating room, general LMA anesthesia provided and he was placed in lithotomy position. A right posterior internal/external hemorrhoids was obvious. The area was prepped and draped, proctoscopy accomplished to 20 cm and was normal. The scope withdrawn, the area reprepped. Anoscopy carried out showing a minor anal fissure and with the general anesthesia, the anus dilated nicely and was no longer in spasm. A second degree right anterior and a third degree left lateral internal hemorrhoid were present. A 0.5% Marcaine with epinephrine was injected around and about the anal orifice and massaged in well. An anal stretch was performed rather than a sphincterotomy as treatment for the anal fissure. The fissure itself was cauterized. The large right posterior internal and external hemorrhoidal complex was excised as a small ellipse, undermined the edges and removed the superficial varicosity and closed the wound with running 2-0 chromic suture. The right anterior left lateral  hemorrhoids were band ligated. There was no other pathology. There was no spasm and no evidence of infection and the procedure was complete.  Gelfoam gauze and dry sterile dressing applied. The patient tolerated the procedure well. Counts were correct and he was taken to the recovery room in good condition.  Careful written and verbal instructions were given to him and his wife including Dilaudid 2 mg #30 were given to the patient and he will be seen and followed as an outpatient. DD:  09/18/99 TD:  09/19/99 Job: [id number]

## 2010-08-07 NOTE — Op Note (Signed)
NAME:  [name], [name]                  ACCOUNT NO.:  [id number]   MEDICAL RECORD NO.:  [id number]          PATIENT TYPE:  AMB   LOCATION:  ENDO                         FACILITY:  [facility]   PHYSICIAN:  Anselmo Rod, M.D.  DATE OF BIRTH:  1970/05/22   DATE OF PROCEDURE:  02/28/2004  DATE OF DISCHARGE:                                 OPERATIVE REPORT   PROCEDURE:  Colonoscopy with cold biopsies x 6.   ENDOSCOPIST:  [name], M.D.   INSTRUMENT USED:  Olympus video colonoscope.   INDICATIONS FOR PROCEDURE:  40 year old white male with a family history of  colon cancer in a paternal grandfather and a personal history of rectal  bleeding and left lower quadrant pain, rule out colonic polyps, masses, etc.  The patient has had adenomatous polyps removed in the past.   PREPROCEDURE PHYSICAL:  Patient with stable vital signs.  Neck supple.  Chest clear to auscultation.  S1 and S2 regular.  Abdomen soft with normal  bowel sounds.   DESCRIPTION OF PROCEDURE:  The patient was placed in the left lateral  decubitus position, sedated with 100 mg of Demerol and 10 mg Versed in slow  incremental doses.  Once the patient was adequately sedated, maintained on  low flow oxygen and continuous cardiac monitoring, the Olympus video  colonoscope was advanced to the rectum.  The appendiceal orifice was  visualized after multiple washes were done.  There was a large amount of  solid stool in the rectum, small lesions could have been missed.  No masses  or polyps were seen.  There was nodular mucosa in the rectosigmoid colon,  multiple biopsies were done to rule out colitis.  The terminal ileum was not  visualized.  Retroflexion in the rectum revealed small internal hemorrhoids  that may be the source of the patient's rectal bleeding.  The patient  tolerated the procedure well without complications.   IMPRESSION:  1.  Internal hemorrhoids.  2.  Nodular mucosa in rectal sigmoid colon, biopsies done,  results pending.  3.  Large amount of residual stool in the right colon, small lesions could      have been missed.  4.  Terminal ileum not visualized.   RECOMMENDATIONS:  1.  Await pathology results.  2.  Avoid nonsteroidals including aspirin for now.  3.  Outpatient follow up in the next two weeks with further recommendations.      Jyot  JNM/MEDQ  D:  02/28/2004  T:  02/28/2004  Job:  [id number]

## 2010-08-07 NOTE — Op Note (Signed)
NAME:  [name], [name]                  ACCOUNT NO.:  [id number]   MEDICAL RECORD NO.:  [id number]          PATIENT TYPE:  AMB   LOCATION:  ENDO                         FACILITY:  [facility]   PHYSICIAN:  Anselmo Rod, M.D.  DATE OF BIRTH:  03/19/1971   DATE OF PROCEDURE:  03/27/2004  DATE OF DISCHARGE:                                 OPERATIVE REPORT   PROCEDURE:  Screening colonoscopy.   ENDOSCOPIST:  Anselmo Rod, M.D.   INSTRUMENT USED:  Olympus video colonoscope.   INDICATIONS FOR PROCEDURE:  A 40 year old white male with a family history  of colon cancer and a personal history of adenomatous polyps and recent  rectal bleeding undergoing a screening colonoscopy to rule out colonic  polyps, masses, etc.  A repeat colonoscopy has been done as a previous  colonoscopy revealed a large amount of residual stool in the colon and  visualization was not adequate.   PREPROCEDURE PREPARATION:  Informed consent was procured from the patient.  The patient fasted for eight hours prior to the procedure and prepped with a  bottle of magnesium citrate and a gallon of GoLYTELY the night prior to the  procedure.   PREPROCEDURE PHYSICAL:  The patient had stable vital signs. Neck supple.  Chest clear to auscultation. S1, S2 regular. Abdomen soft with normal bowel  sounds.   DESCRIPTION OF PROCEDURE:  The patient was placed in the left lateral  decubitus position and sedated with and additional 20 mg of Demerol and 2 mg  of Versed in slow increment doses. Once the patient was adequately sedated  and maintained on low flow oxygen and continuous cardiac monitoring, the  Olympus video colonoscope was advanced from the rectum to the cecum. There  was a significant amount of residual stool in the colon and multiple washes  were done.  No masses, polyps, erosions, ulcerations or diverticula were  seen. Small internal hemorrhoids were seen on retroflexion. The appendiceal  orifice and ileocecal valve  were clearly visualized and photographed.   IMPRESSION:  1.  Normal colonoscopy up to the cecum except for small internal hemorrhoids      seen on retroflexion.  2.  No evidence of masses, polyps or diverticulosis.   RECOMMENDATIONS:  1.  A repeat colonoscopy has been recommended in the next five years unless      the patient develops abnormal      symptoms in the interim.  2.  Outpatient followup as the need arises in the future.  3.  Continue a high fiber diet with liberal fluid intake as recommended in      the past.      Jyot   JNM/MEDQ  D:  03/27/2004  T:  03/27/2004  Job:  [id number]

## 2010-08-07 NOTE — Op Note (Signed)
NAME:  [name], [name]                            ACCOUNT NO.:  [id number]   MEDICAL RECORD NO.:  [id number]                   PATIENT TYPE:  OBV   LOCATION:  0369                                 [facility]ACILITY:  [facility]   PHYSICIAN:  Timothy E. Earlene Plater, M.D.              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   PROCEDURE:  Laparoscopy with laparoscopic cholecystectomy and operative  cholangiogram.   SURGEON:  Timothy E. Earlene Plater, M.D.   ASSISTANT:  Sandria Bales. Ezzard Standing, M.D.   ANESTHESIA:  CRNA supervised M.D.   INDICATIONS FOR PROCEDURE:  Mr. Rewerts was seen urgently yesterday in the  office in extreme right upper quadrant pain. He had been seen and evaluated  in the emergency room with CT scan, small bowel series, gallbladder  ultrasound and HIDA scan. He had no uptake and no ejection from the  gallbladder. His laboratory data were normal. Because of history clinical  and presentation, he is urgently scheduled for cholecystectomy and as well  laparoscopy. He agrees and understands completely. He is identified and the  permit signed.   DESCRIPTION OF PROCEDURE:  He is taken to the operating room, placed supine,  general endotracheal anesthesia administered. The abdomen was shaved,  prepped and draped in the usual fashion. Marcaine 0.25% with epinephrine is  used prior to each incision. A vertical incision made in the infraumbilical  fold, fascia identified, opened in the midline, grasped, peritoneum entered  without complications, #1 Vicryl pursestring suture placed across the  fascial incision, the Hasson catheter placed through the incision and the  abdomen insufflated. Under direct vision, a 10 mm catheter was placed in the  mid  epigastrium, two 5 mm trocars in the right upper quadrant. Then with the  patient in the head down position, the mid abdomen, terminal ileum,  appendix, cecum and sigmoid and pelvis were all carefully scrutinized,  pictures were made and included in the chart and these were all thought to  be completely normal without signs of colitis enteritis of any nature.  Attention was turned to the gallbladder, it was grasped, placed on tension.  Overall it appeared normal in gross appearance. Careful dissection at the  base of the gallbladder revealed a small cystic duct, behind that a small  cystic artery. The cystic duct was completely dissected out, a clip placed  near the gallbladder. The cystic duct opened, the lumen was quite small.  Using the [facility] catheter, I was able to intubate the remnant of the cystic  and shoot a real time cholangiogram with 0.5% Hypaque and real-time  fluoroscopy. The entire tract filled immediately and completely and without  abnormality and the dye spilled readily into  the duodenum through the  ampulla. The catheter was removed, the stump of the cystic duct triply  clipped. The cystic artery triply clipped and divided and the gallbladder  dissected from the gallbladder bed without complication or incident. Careful  inspection revealed the gallbladder bed to be dry, irrigation was completely  clear. The gallbladder was removed through the infraumbilical incision, it  was opened and checked, there were no apparent stones. The entire specimen  submitted to pathology. Reinspection of the area was all clear, irrigant was  clear and  then all irrigant, CO2, instruments and trocars removed under direct vision.  The abdomen was flat and soft. The skin incisions were inspected, checked  and closed with subcuticular 3-0 Monocryl. Steri-Strips applied, all counts  correct. He had tolerated it well and was awakened and taken to the recovery  room in good condition.                                                Timothy E. Earlene Plater, M.D.    TED/MEDQ  D:  12/25/2002  T:  12/25/2002  Job:  [id number]

## 2010-12-17 LAB — URINALYSIS, ROUTINE W REFLEX MICROSCOPIC
Bilirubin Urine: NEGATIVE
Glucose, UA: NEGATIVE
Ketones, ur: NEGATIVE
Protein, ur: NEGATIVE
pH: 8

## 2010-12-17 LAB — COMPREHENSIVE METABOLIC PANEL
ALT: 20
Albumin: 3.5
Alkaline Phosphatase: 52
BUN: 10
Chloride: 107
Glucose, Bld: 102 — ABNORMAL HIGH
Potassium: 3.8
Sodium: 140
Total Bilirubin: 2.1 — ABNORMAL HIGH
Total Protein: 6.9

## 2010-12-17 LAB — CBC
HCT: 44.9
Hemoglobin: 15.8
Platelets: 240
RDW: 13.2
WBC: 13.9 — ABNORMAL HIGH

## 2010-12-17 LAB — AMYLASE: Amylase: 49

## 2011-12-03 IMAGING — CT CT ABCESS DRAINAGE
1 of 2 series · 15 of 32 positions shown, 19 images · non-contrast
Comparison: none

Clinical Data/Indication: RIGHT LOWER QUADRANT ABSCESS

CT GUIDED ABCESS DRAINAGE WITH CATHETER
Sedation: Versed 4 mg, Fentanyl 200 mg.
Total Moderate Sedation Time: 27 minutes.
Procedure: The procedure, risks, benefits, and alternatives were
explained to the patient. Questions regarding the procedure were
encouraged and answered. The patient understands and consents to
the procedure.
The right lower quadrant was prepped with betadine in a sterile
fashion, and a sterile drape was applied covering the operative
field. A sterile gown and sterile gloves were used for the
procedure.
Under CT guidance, a 19 gauge needle was inserted into the right
lower quadrant abscess and removed over an Amplatz.  12-French
dilator followed by 12-French drain was inserted.  It was looped
and string fixed.  5 ml frank pus was aspirated.  It was sewn to
the skin.

[Series 2: abd pelvis · axial · 0.81mm/px · z∈[-380,-260]mm · 15 of 70 slices shown, 19 images]
[im 6/70  soft-tissue]
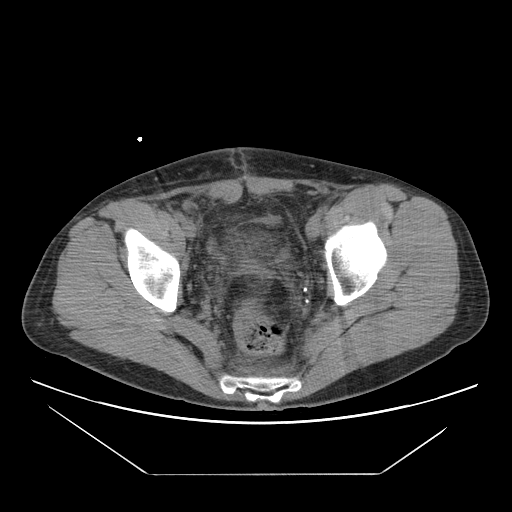
[im 6/70  bone]
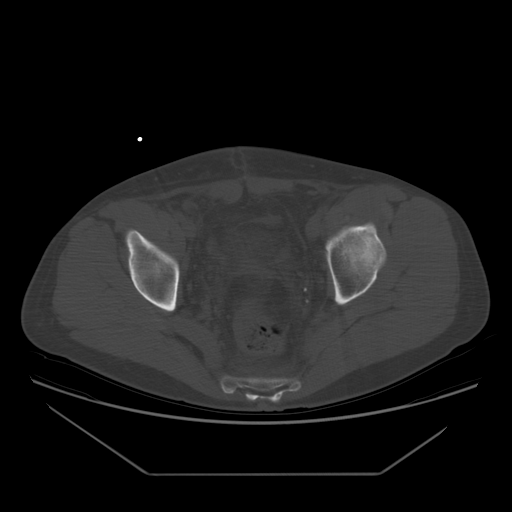
[im 11/70  soft-tissue]
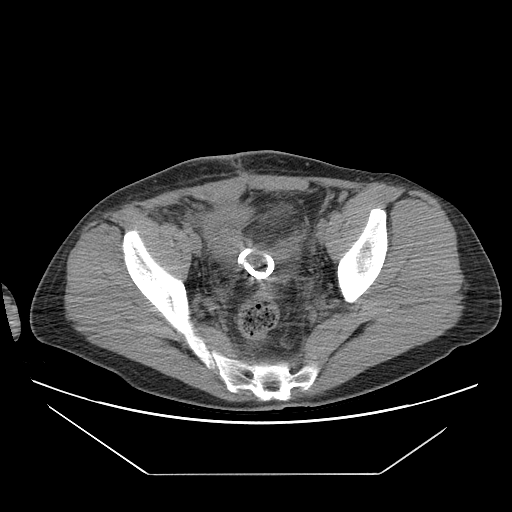
[im 16/70  soft-tissue]
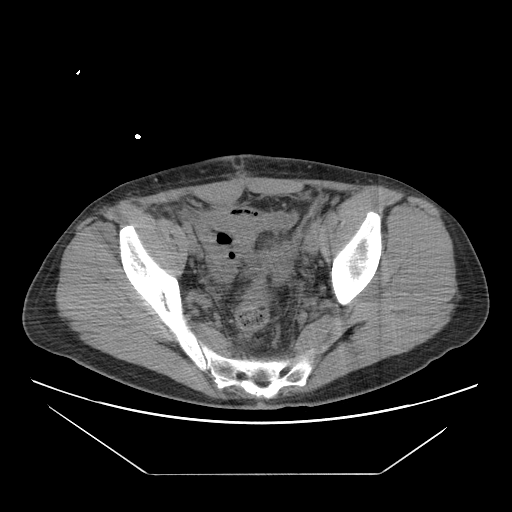
[im 21/70  soft-tissue]
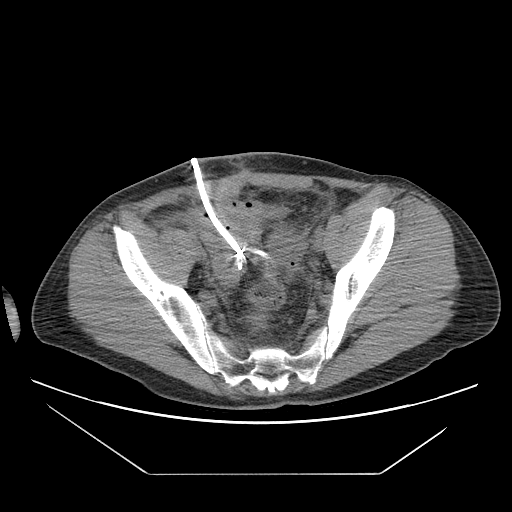
[im 26/70  soft-tissue]
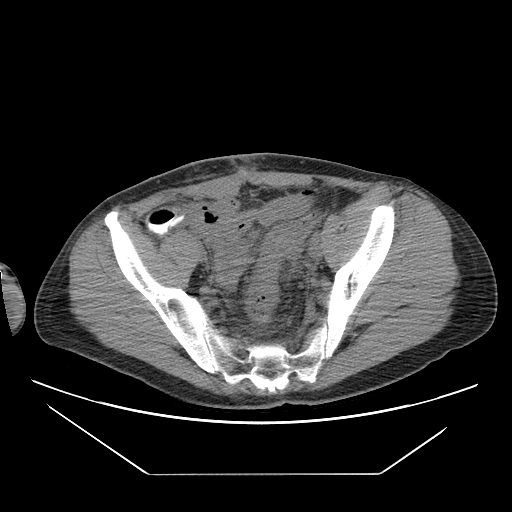
[im 31/70  soft-tissue]
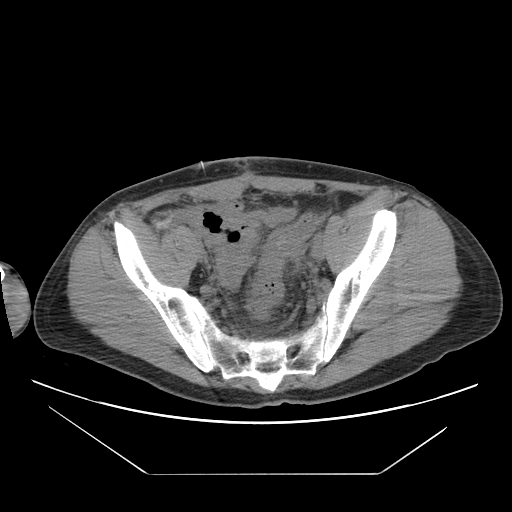
[im 36/70  soft-tissue]
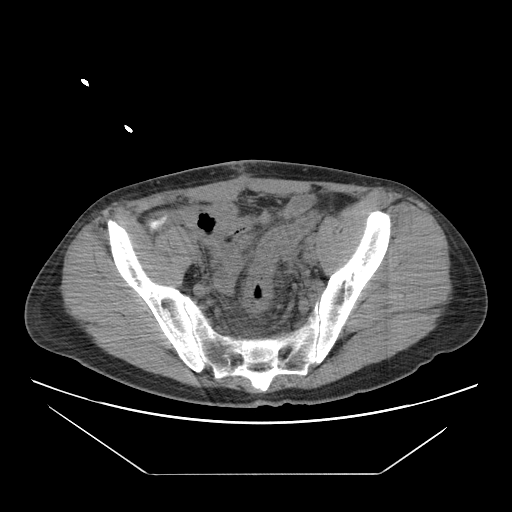
[im 41/70  soft-tissue]
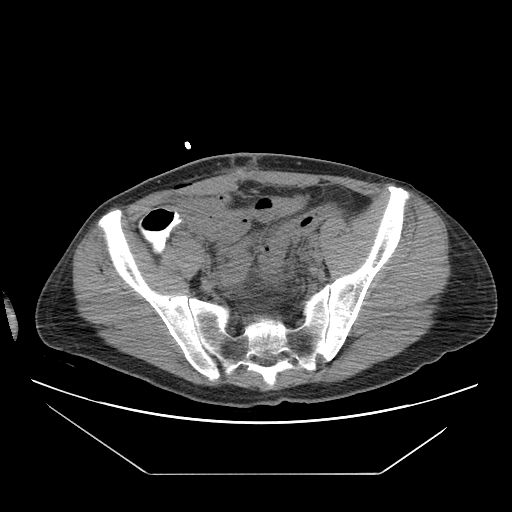
[im 47/70  soft-tissue]
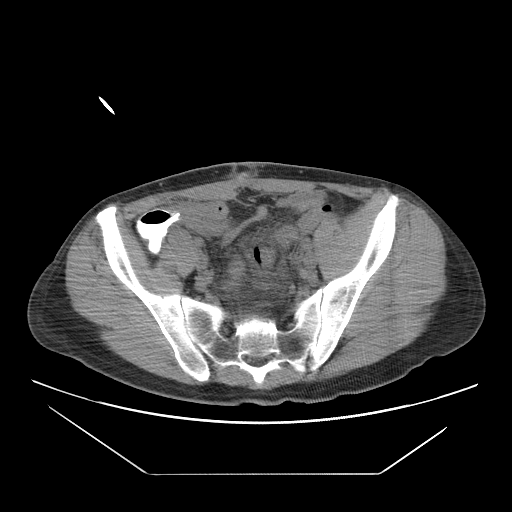
[im 47/70  bone]
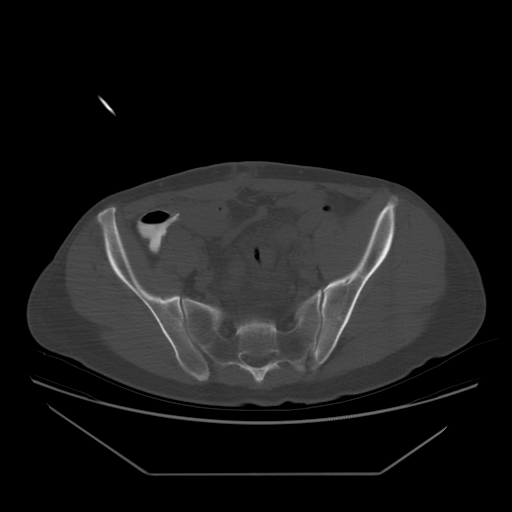
[im 52/70  soft-tissue]
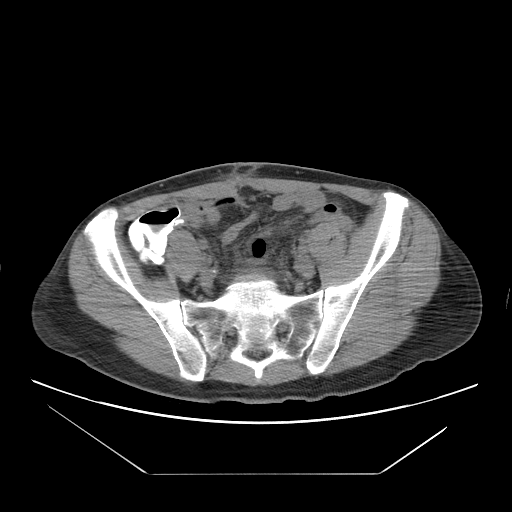
[im 57/70  soft-tissue]
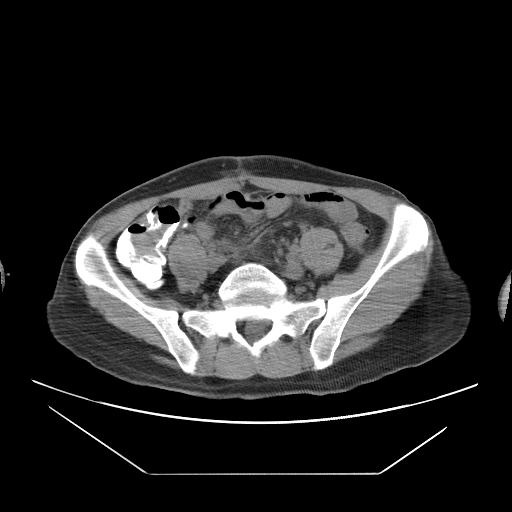
[im 59/70  lung]
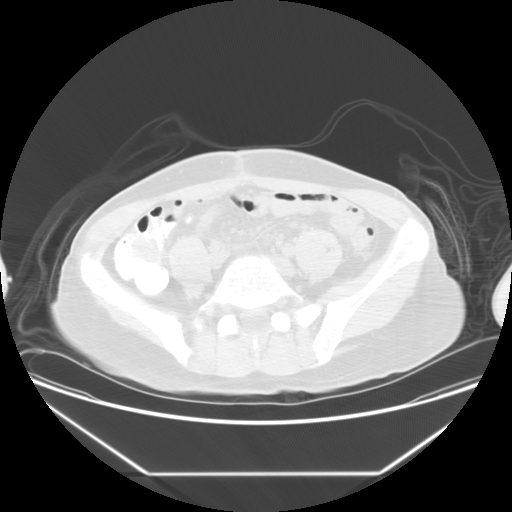
[im 62/70  soft-tissue]
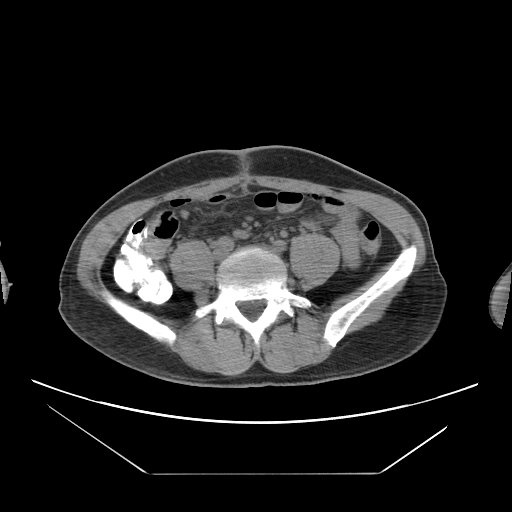
[im 62/70  lung]
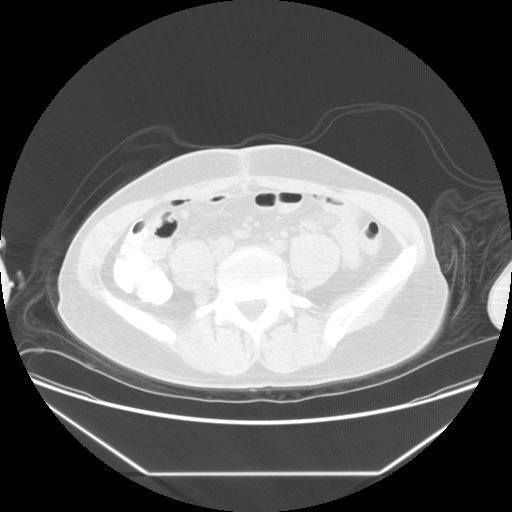
[im 64/70  lung]
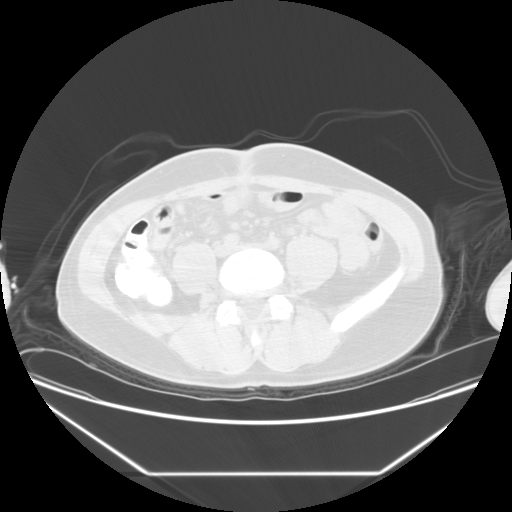
[im 67/70  soft-tissue]
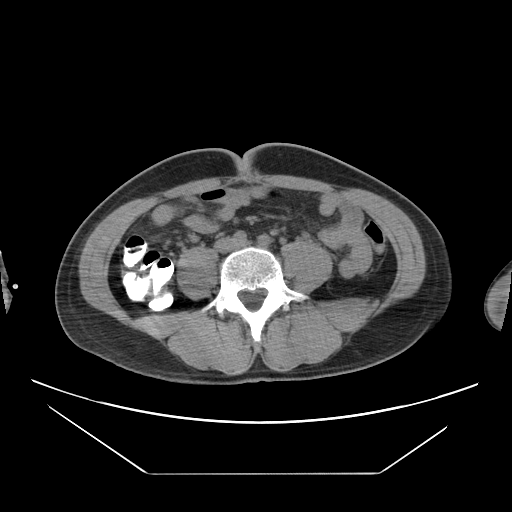
[im 67/70  lung]
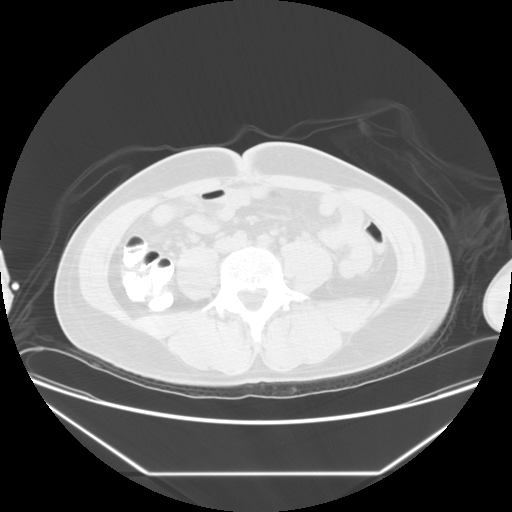

[15 of 32 positions shown; findings below may reference images not displayed]

FINDINGS: Imaging demonstrates placement of a right lower quadrant
12-French abscess drain.

Complications: None
IMPRESSION: Successful right lower quadrant abscess drain.  Frank pus was
aspirated.

## 2012-02-01 ENCOUNTER — Emergency Department: Payer: Self-pay | Admitting: Emergency Medicine

## 2012-02-01 LAB — CBC
HCT: 48.8 % (ref 40.0–52.0)
HGB: 16.8 g/dL (ref 13.0–18.0)
MCH: 31.5 pg (ref 26.0–34.0)
MCHC: 34.5 g/dL (ref 32.0–36.0)
MCV: 91 fL (ref 80–100)
Platelet: 315 x10 3/mm 3 (ref 150–440)
RBC: 5.35 x10 6/mm 3 (ref 4.40–5.90)
RDW: 13.5 % (ref 11.5–14.5)
WBC: 18 x10 3/mm 3 — ABNORMAL HIGH (ref 3.8–10.6)

## 2012-02-01 LAB — COMPREHENSIVE METABOLIC PANEL
Albumin: 4.4 g/dL (ref 3.4–5.0)
Alkaline Phosphatase: 74 U/L (ref 50–136)
Calcium, Total: 9.7 mg/dL (ref 8.5–10.1)
Co2: 21 mmol/L (ref 21–32)
Creatinine: 1.44 mg/dL — ABNORMAL HIGH (ref 0.60–1.30)
EGFR (Non-African Amer.): 59 — ABNORMAL LOW
Glucose: 166 mg/dL — ABNORMAL HIGH (ref 65–99)
Osmolality: 282 (ref 275–301)
SGPT (ALT): 25 U/L (ref 12–78)
Sodium: 139 mmol/L (ref 136–145)

## 2012-02-01 LAB — AMYLASE: Amylase: 38 U/L (ref 25–115)

## 2012-02-03 ENCOUNTER — Other Ambulatory Visit: Payer: Self-pay | Admitting: Gastroenterology

## 2012-02-03 DIAGNOSIS — R109 Unspecified abdominal pain: Secondary | ICD-10-CM

## 2012-02-04 ENCOUNTER — Ambulatory Visit
Admission: RE | Admit: 2012-02-04 | Discharge: 2012-02-04 | Disposition: A | Discharge status: Home / Self Care | Payer: 59 | Source: Ambulatory Visit | Attending: Gastroenterology | Admitting: Gastroenterology

## 2012-02-04 DIAGNOSIS — R109 Unspecified abdominal pain: Secondary | ICD-10-CM

## 2012-02-04 MED ORDER — IOHEXOL 300 MG/ML  SOLN
100.0000 mL | Freq: Once | INTRAMUSCULAR | Status: AC | PRN
  Administered 2012-02-04: 100 mL via INTRAVENOUS

## 2012-04-10 ENCOUNTER — Emergency Department: Payer: Self-pay | Admitting: Emergency Medicine

## 2012-04-10 LAB — COMPREHENSIVE METABOLIC PANEL
Alkaline Phosphatase: 69 U/L (ref 50–136)
Bilirubin,Total: 1.2 mg/dL — ABNORMAL HIGH (ref 0.2–1.0)
Calcium, Total: 8.9 mg/dL (ref 8.5–10.1)
Creatinine: 1.01 mg/dL (ref 0.60–1.30)
Glucose: 113 mg/dL — ABNORMAL HIGH (ref 65–99)
Osmolality: 278 (ref 275–301)
Potassium: 3.5 mmol/L (ref 3.5–5.1)
SGPT (ALT): 27 U/L (ref 12–78)

## 2012-04-10 LAB — CBC
HCT: 45.6 % (ref 40.0–52.0)
MCV: 90 fL (ref 80–100)
Platelet: 245 10*3/uL (ref 150–440)
RBC: 5.05 10*6/uL (ref 4.40–5.90)

## 2012-04-10 LAB — LIPASE, BLOOD: Lipase: 78 U/L (ref 73–393)

## 2012-04-24 LAB — CBC
HCT: 48 % (ref 40.0–52.0)
HGB: 16 g/dL (ref 13.0–18.0)
MCHC: 33.4 g/dL (ref 32.0–36.0)
MCV: 92 fL (ref 80–100)
RBC: 5.19 10*6/uL (ref 4.40–5.90)

## 2012-04-25 ENCOUNTER — Telehealth (INDEPENDENT_AMBULATORY_CARE_PROVIDER_SITE_OTHER): Payer: Self-pay | Admitting: General Surgery

## 2012-04-25 ENCOUNTER — Inpatient Hospital Stay: Payer: Self-pay | Admitting: Surgery

## 2012-04-25 LAB — URINALYSIS, COMPLETE
Blood: NEGATIVE
Glucose,UR: NEGATIVE mg/dL (ref 0–75)
Ketone: NEGATIVE
Leukocyte Esterase: NEGATIVE
Ph: 6 (ref 4.5–8.0)
Protein: 25
RBC,UR: 1 /HPF (ref 0–5)

## 2012-04-25 LAB — COMPREHENSIVE METABOLIC PANEL
Albumin: 4.2 g/dL (ref 3.4–5.0)
Alkaline Phosphatase: 73 U/L (ref 50–136)
BUN: 14 mg/dL (ref 7–18)
Calcium, Total: 9.9 mg/dL (ref 8.5–10.1)
Potassium: 3.8 mmol/L (ref 3.5–5.1)
Sodium: 139 mmol/L (ref 136–145)
Total Protein: 8.4 g/dL — ABNORMAL HIGH (ref 6.4–8.2)

## 2012-04-25 NOTE — Telephone Encounter (Signed)
Patient calling because he has had trouble with abdominal pain and vomiting. He is currently admitted at [facility] and the surgeon at the hospital thinks he has an obstruction and needs surgery. Since all his past abdominal surgeries have been by Dr Lindie Spruce he wants to be transferred to here and have Dr Lindie Spruce do his surgery. I spoke with Dr Lindie Spruce who advised he does not have any time for procedures on his schedule for the next two weeks with his call schedule. I advised patient he should stay at [facility]. He will let us know if he wants to leave and wait for Dr Lindie Spruce. I advised him against this.

## 2014-07-12 NOTE — H&P (Signed)
Subjective/Chief Complaint n/v    History of Present Illness mult trips to ED recently for N/V abd pain 25 emesis in 24 hrs. 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   Past Med/Surgical Hx:  Diverticulitis:   colon resection:   ALLERGIES:  No Known Allergies:   Family and Social History:   Family History Non-Contributory    Social History positive  tobacco, negative ETOH, recent divorce    + Tobacco Current (within 1 year)    Place of Living Home   Review of Systems:   Fever/Chills No    Cough No    Abdominal Pain Yes    Diarrhea No    Constipation No    Nausea/Vomiting Yes    SOB/DOE No    Chest Pain No    Dysuria No    Tolerating Diet No  Nauseated  Vomiting    Medications/Allergies Reviewed Medications/Allergies reviewed   Physical Exam:   GEN no acute distress    HEENT pink conjunctivae    NECK supple    RESP normal resp effort  clear BS  no use of accessory muscles    CARD regular rate    ABD soft  min distension, min tenderness rlq, scars    LYMPH negative neck    EXTR negative edema    SKIN normal to palpation    PSYCH alert, A+O to time, place, person, good insight   Lab Results: Hepatic:  03-Feb-14 23:37    Bilirubin, Total  1.5   Alkaline Phosphatase 73   SGPT (ALT) 23   SGOT (AST) 29   Total Protein, Serum  8.4   Albumin, Serum 4.2  Routine Chem:  03-Feb-14 23:37    Glucose, Serum  126   BUN 14   Creatinine (comp) 1.11   Sodium, Serum 139   Potassium, Serum 3.8   Chloride, Serum  108   CO2, Serum 23   Calcium (Total), Serum 9.9   Osmolality (calc) 280   eGFR (African American) >60   eGFR (Non-African American) >60 (eGFR values <61m/min/1.73 m2 may be an indication of chronic kidney disease (CKD). Calculated eGFR is useful in patients with stable renal function. The eGFR calculation will not be reliable in acutely ill  patients when serum creatinine is changing rapidly. It is not useful in  patients on dialysis. The eGFR calculation may not be applicable to patients at the low and high extremes of body sizes, pregnant women, and vegetarians.)   Result Comment POTASSIUM/AST - Slight hemolysis, interpret results with  - caution..Marland Kitchenpl  Result(s) reported on 25 Apr 2012 at 12:00AM.   Anion Gap 8   Lipase  72 (Result(s) reported on 25 Apr 2012 at 12:00AM.)  Routine Hem:  03-Feb-14 23:37    WBC (CBC)  17.3   RBC (CBC) 5.19   Hemoglobin (CBC) 16.0   Hematocrit (CBC) 48.0   Platelet Count (CBC) 312 (Result(s) reported on 24 Apr 2012 at 11:50PM.)   MCV 92   MCH 30.9   MCHC 33.4   RDW 13.4   Radiology Results: XRay:    20-Jan-14 09:13, Abdomen 3 Way Includes PA Chest   Abdomen 3 Way Includes PA Chest   REASON FOR EXAM:    Pain  COMMENTS:   May transport without cardiac monitor    PROCEDURE: DXR - DXR ABDOMEN 3-WAY (INCL PA CXR)  - Apr 10 2012  9:13AM     RESULT: The lungs are clear. Cardiac silhouette is unremarkable. Small   air-fluid levels are present within the mid abdominal region. The stomach   is nondistended. There is air and fecal material scattered through the   colon to the rectum 3 there is no free air.    IMPRESSION:   1. No acute cardiopulmonary disease.  2. No definite abnormal bowel distention. There are some air-fluid levels   present. Correlate for focal ileus. No findings of bowel perforation.  Dictation Site: 2        Verified By: Sundra Aland, M.D., MD  CT:    12-Nov-13 10:41, CT Abdomen and Pelvis Without Contrast   CT Abdomen and Pelvis Without Contrast   REASON FOR EXAM:    (1) pain diffuse and vomiting; (2) pain  COMMENTS:   May transport without cardiac monitor    PROCEDURE: CT  - CT ABDOMEN AND PELVIS W0  - Feb 01 2012 10:41AM     RESULT: Comparison: None    Technique: Multiple axial images from the lung bases to the symphysis   pubis were obtained  without oral and without intravenous contrast.    Findings:  Minimal basilar opacities are likely secondary to atelectasis.    Lack of intravenous contrast limits evaluation of the solid abdominal   organs.  Grossly, the liver, spleen, adrenals, and pancreas are     unremarkable. Surgical clips are seen from prior cholecystectomy. Mild   thickening of the distal esophagus may be related to a hiatal hernia   and/or underdistention.    No renal calculi or hydronephrosis. No ureterectasis. There are multiple   bowel suture lines in the right in the abdomen. There are multiple loops   of mild dilated fluid-filled bowel. The terminal ileum is decompressed.   There is fluid in the ascending colon.The remainder of the colon is   decompressed. There is a trace amount of free fluid in the pelvis.    No aggressive lytic or sclerotic osseous lesions are identified.    IMPRESSION:   1. There are multiple fluid-filled mildly dilated small bowel loops with   decompressed terminal ileum. Findings may represent ileus versus early or     partial small bowel obstruction.  2. Thickening of the distal esophagus may be secondary to underdistention   and/or possible small hiatal hernia. Further evaluation could be provided   with endoscopy, as indicated.          Verified By: Gregor Hams, M.D., MD     Assessment/Admission Diagnosis SBO, anastamotic? may need oral contrast CT  admit, hydrate, NG   Electronic Signatures: Florene Glen (MD)  (Signed 04-Feb-14 04:02)  Authored: CHIEF COMPLAINT and HISTORY, PAST MEDICAL/SURGIAL HISTORY, ALLERGIES, FAMILY AND SOCIAL HISTORY, REVIEW OF SYSTEMS, PHYSICAL EXAM, LABS, Radiology, ASSESSMENT AND PLAN   Last Updated: 04-Feb-14 04:02 by Florene Glen (MD)

## 2014-07-12 NOTE — Discharge Summary (Signed)
PATIENT NAME:  George OrganHARPER, Antwuan P MR#:  [id number] DATE OF BIRTH:  03/16/1971  DATE OF ADMISSION:  04/25/2012 DATE OF DISCHARGE:  04/25/2012  DIAGNOSIS: Partial small bowel obstruction, resolved.   PROCEDURES: None.   CONSULTANTS: None.   HISTORY OF PRESENT ILLNESS/HOSPITAL COURSE: This is a patient with multiple episodes of small bowel obstruction, which has resolved spontaneously. He presents again to the Emergency Room with abdominal pain and multiple emesis. A CT scan done with no contrast demonstrated a possible anastomotic stricture from prior colon resection and ileostomy and ileostomy closure. A repeat CT scan done later in the day with oral contrast demonstrated that the contrast flowed through into the colon. He spontaneously started passing gas and having bowel movements and feels like his pain is completely gone at this point. He feels back to normal and he is asking for discharge.   His exam at the time of discharge demonstrated no sign of a bowel obstruction. CT scan was reviewed personally.   At this point the patient is discharged in stable condition. A nasogastric tube will be removed prior to discharge and will be started on clear liquids. He is instructed to follow up in my office by telephone and we will obtain a small bowel follow-through as an outpatient and then see him in the office. I offered to see him myself versus having him go [facility] and he wishes to come and see me for definitive therapy, which he will ultimately need because this has recurred so many times and it appears that this is an anastomotic process and will probably not resolve spontaneously with any long-lasting effects. I discussed with him the probable need for resection, but the small bowel follow-through study would be of value in this patient. He understood and agreed to proceed. No new medications were given to him on discharge. He is instructed to restart any home medications.     ____________________________ Adah Salvageichard E. Excell Seltzerooper, MD rec:aw D: 04/25/2012 21:11:37 ET T: 04/26/2012 07:13:38 ET JOB#: [id number]  cc: Adah Salvageichard E. Excell Seltzerooper, MD, <Dictator> Lattie HawICHARD E Coleman Kalas MD ELECTRONICALLY SIGNED 04/27/2012 1:24

## 2014-07-12 NOTE — H&P (Signed)
PATIENT NAME:  [name], George P MR#:  [id number] DATE OF BIRTH:  05-Oct-1970  DATE OF ADMISSION:  04/25/2012  CHIEF COMPLAINT: Nausea, vomiting and abdominal pain.   HISTORY OF PRESENT ILLNESS: This is a 44 year old Caucasian male patient with a history of multiple episodes of nausea, vomiting and abdominal pain. He has been to the Emergency Room multiple times and had CT scans and films done in the past suggestive of an early partial small bowel obstruction. This time he came in with 25 emeses, 24 hours of epigastric pain, no flatus and a single bowel movement yesterday. A workup has suggested bowel obstruction. Dr. Manson PasseyBrown and I discussed the patient's admission based on these findings.  The patient describes epigastric pain, nausea and vomiting, no fevers or chills, and multiple episodes like this which resolved.   PAST MEDICAL HISTORY: None.   PAST SURGICAL HISTORY: Apparent large bowel obstruction of unclear etiology in which he had "3 feet of colon" removed. 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 [facility] years ago.   ALLERGIES: No known drug allergies.   MEDICATIONS: None.   FAMILY HISTORY: Noncontributory.   SOCIAL HISTORY: The patient smokes tobacco. He does not drink alcohol. He is employed but not insured. He is worried about coming into the Emergency Room and being admitted to the hospital because of his lack of insurance. He is recently divorced.  REVIEW OF SYSTEMS: A 10 system review is performed and negative with the exception of that mentioned in the HPI.  PHYSICAL EXAMINATION: GENERAL: Comfortable-appearing Caucasian male patient.  VITAL SIGNS: He is afebrile with a temp of 97.9, pulse of 58, respirations of 20, blood pressure 125/80, 96% room air sat and pain scale of 4.  HEENT: No scleral icterus.  NECK: No palpable neck nodes.  LUNGS: Clear to auscultation.  HEART: Regular rate and rhythm.  ABDOMEN: Abdomen shows a  midline infraumbilical scar and a right-sided transverse scar, both healed. No groin hernias are noted. Abdomen is nondistended, nontympanitic but tender in the right lower quadrant.  EXTREMITIES: No edema.  NEUROLOGIC: Grossly intact.  INTEGUMENT: No jaundice.   LABS/RADIOLOGIC STUDIES:  CT scan is personally reviewed suggesting an anastomotic stricture or obstruction and a staple line with dilated loops proximal.   Electrolytes are within normal limits with the exception of glucose of 126. Bilirubin is 1.5. White blood cell count is 17.3 and H and H are 16 and 48.   ASSESSMENT AND PLAN: This is a patient with a small bowel obstruction following a colon resection for unclear etiology as well as an ileostomy closure. He requires admission because of his multiple emeses, a nasogastric tube will be placed, he will be hydrated and films repeated. I will discuss this case with Dr. Egbert GaribaldiBird in the morning. I have discussed it with Dr. Manson PasseyBrown in the ED and the patient was in agreement with this plan. ____________________________ Adah Salvageichard E. Excell Seltzerooper, MD rec:sb D: 04/25/2012 04:10:20 ET T: 04/25/2012 11:04:17 ET JOB#: [id number]  cc: Adah Salvageichard E. Excell Seltzerooper, MD, <Dictator> Lattie HawICHARD E Homero Hyson MD ELECTRONICALLY SIGNED 04/27/2012 1:24

## 2014-12-02 ENCOUNTER — Inpatient Hospital Stay (HOSPITAL_COMMUNITY): Payer: BLUE CROSS/BLUE SHIELD

## 2014-12-02 ENCOUNTER — Inpatient Hospital Stay (HOSPITAL_COMMUNITY)
Admission: EM | Admit: 2014-12-02 | Discharge: 2014-12-03 | DRG: 390 | Disposition: A | Discharge status: Home / Self Care | Payer: BLUE CROSS/BLUE SHIELD | Attending: Internal Medicine | Admitting: Internal Medicine

## 2014-12-02 ENCOUNTER — Emergency Department (HOSPITAL_COMMUNITY): Payer: BLUE CROSS/BLUE SHIELD

## 2014-12-02 ENCOUNTER — Encounter (HOSPITAL_COMMUNITY): Payer: Self-pay | Admitting: Emergency Medicine

## 2014-12-02 DIAGNOSIS — K56609 Unspecified intestinal obstruction, unspecified as to partial versus complete obstruction: Secondary | ICD-10-CM

## 2014-12-02 DIAGNOSIS — E876 Hypokalemia: Secondary | ICD-10-CM | POA: Diagnosis present

## 2014-12-02 DIAGNOSIS — K5660 Unspecified intestinal obstruction: Secondary | ICD-10-CM | POA: Diagnosis not present

## 2014-12-02 DIAGNOSIS — K5669 Other intestinal obstruction: Secondary | ICD-10-CM | POA: Diagnosis not present

## 2014-12-02 DIAGNOSIS — Z9049 Acquired absence of other specified parts of digestive tract: Secondary | ICD-10-CM | POA: Diagnosis present

## 2014-12-02 DIAGNOSIS — R110 Nausea: Secondary | ICD-10-CM | POA: Diagnosis present

## 2014-12-02 DIAGNOSIS — F17210 Nicotine dependence, cigarettes, uncomplicated: Secondary | ICD-10-CM | POA: Diagnosis present

## 2014-12-02 HISTORY — DX: Diverticulitis of intestine, part unspecified, without perforation or abscess without bleeding: K57.92

## 2014-12-02 LAB — COMPREHENSIVE METABOLIC PANEL
ALT: 16 U/L — AB (ref 17–63)
AST: 18 U/L (ref 15–41)
Albumin: 4 g/dL (ref 3.5–5.0)
Alkaline Phosphatase: 46 U/L (ref 38–126)
Anion gap: 10 (ref 5–15)
BUN: 12 mg/dL (ref 6–20)
CHLORIDE: 103 mmol/L (ref 101–111)
CO2: 23 mmol/L (ref 22–32)
CREATININE: 1.21 mg/dL (ref 0.61–1.24)
Calcium: 9.1 mg/dL (ref 8.9–10.3)
Glucose, Bld: 132 mg/dL — ABNORMAL HIGH (ref 65–99)
Potassium: 3.3 mmol/L — ABNORMAL LOW (ref 3.5–5.1)
Sodium: 136 mmol/L (ref 135–145)
Total Bilirubin: 1.3 mg/dL — ABNORMAL HIGH (ref 0.3–1.2)
Total Protein: 7.2 g/dL (ref 6.5–8.1)

## 2014-12-02 LAB — URINALYSIS, DIPSTICK ONLY
BILIRUBIN URINE: NEGATIVE
Glucose, UA: NEGATIVE mg/dL
HGB URINE DIPSTICK: NEGATIVE
KETONES UR: NEGATIVE mg/dL
Leukocytes, UA: NEGATIVE
NITRITE: NEGATIVE
PROTEIN: NEGATIVE mg/dL
Specific Gravity, Urine: 1.008 (ref 1.005–1.030)
UROBILINOGEN UA: 0.2 mg/dL (ref 0.0–1.0)
pH: 6 (ref 5.0–8.0)

## 2014-12-02 LAB — CBC
HEMATOCRIT: 40.5 % (ref 39.0–52.0)
Hemoglobin: 13.9 g/dL (ref 13.0–17.0)
MCH: 31.9 pg (ref 26.0–34.0)
MCHC: 34.3 g/dL (ref 30.0–36.0)
MCV: 92.9 fL (ref 78.0–100.0)
Platelets: 226 10*3/uL (ref 150–400)
RBC: 4.36 MIL/uL (ref 4.22–5.81)
RDW: 13.2 % (ref 11.5–15.5)
WBC: 7.4 10*3/uL (ref 4.0–10.5)

## 2014-12-02 LAB — CBC WITH DIFFERENTIAL/PLATELET
BASOS ABS: 0 10*3/uL (ref 0.0–0.1)
BASOS PCT: 0 % (ref 0–1)
EOS ABS: 0.1 10*3/uL (ref 0.0–0.7)
EOS PCT: 1 % (ref 0–5)
HCT: 41.6 % (ref 39.0–52.0)
Hemoglobin: 14.3 g/dL (ref 13.0–17.0)
LYMPHS PCT: 11 % — AB (ref 12–46)
Lymphs Abs: 1.4 10*3/uL (ref 0.7–4.0)
MCH: 31 pg (ref 26.0–34.0)
MCHC: 34.4 g/dL (ref 30.0–36.0)
MCV: 90.2 fL (ref 78.0–100.0)
Monocytes Absolute: 1.1 10*3/uL — ABNORMAL HIGH (ref 0.1–1.0)
Monocytes Relative: 9 % (ref 3–12)
Neutro Abs: 9.7 10*3/uL — ABNORMAL HIGH (ref 1.7–7.7)
Neutrophils Relative %: 79 % — ABNORMAL HIGH (ref 43–77)
PLATELETS: 254 10*3/uL (ref 150–400)
RBC: 4.61 MIL/uL (ref 4.22–5.81)
RDW: 13 % (ref 11.5–15.5)
WBC: 12.3 10*3/uL — AB (ref 4.0–10.5)

## 2014-12-02 LAB — CREATININE, SERUM
Creatinine, Ser: 1.17 mg/dL (ref 0.61–1.24)
GFR calc Af Amer: >60 mL/min (ref >60)
GFR calc non Af Amer: >60 mL/min (ref >60)

## 2014-12-02 LAB — URINALYSIS, ROUTINE W REFLEX MICROSCOPIC
BILIRUBIN URINE: NEGATIVE
Glucose, UA: NEGATIVE mg/dL
HGB URINE DIPSTICK: NEGATIVE
Ketones, ur: NEGATIVE mg/dL
Leukocytes, UA: NEGATIVE
Nitrite: NEGATIVE
PROTEIN: NEGATIVE mg/dL
Specific Gravity, Urine: 1.017 (ref 1.005–1.030)
UROBILINOGEN UA: 0.2 mg/dL (ref 0.0–1.0)
pH: 6.5 (ref 5.0–8.0)

## 2014-12-02 LAB — LIPASE, BLOOD: LIPASE: 21 U/L — AB (ref 22–51)

## 2014-12-02 LAB — PHOSPHORUS: Phosphorus: 2.4 mg/dL — ABNORMAL LOW (ref 2.5–4.6)

## 2014-12-02 LAB — MAGNESIUM: Magnesium: 2 mg/dL (ref 1.7–2.4)

## 2014-12-02 MED ORDER — POTASSIUM CHLORIDE IN NACL 20-0.9 MEQ/L-% IV SOLN
INTRAVENOUS | Status: DC
  Administered 2014-12-02 – 2014-12-03 (×4): via INTRAVENOUS
  Filled 2014-12-02 (×6): qty 1000

## 2014-12-02 MED ORDER — NICOTINE 21 MG/24HR TD PT24
21.0000 mg | MEDICATED_PATCH | Freq: Every day | TRANSDERMAL | Status: DC
  Administered 2014-12-02: 21 mg via TRANSDERMAL
  Filled 2014-12-02 (×2): qty 1

## 2014-12-02 MED ORDER — IOHEXOL 300 MG/ML  SOLN
100.0000 mL | Freq: Once | INTRAMUSCULAR | Status: AC | PRN
  Administered 2014-12-02: 100 mL via INTRAVENOUS

## 2014-12-02 MED ORDER — SODIUM CHLORIDE 0.9 % IV SOLN: INTRAVENOUS | Status: DC

## 2014-12-02 MED ORDER — ONDANSETRON HCL 4 MG PO TABS: 4.0000 mg | ORAL_TABLET | Freq: Four times a day (QID) | ORAL | Status: DC | PRN

## 2014-12-02 MED ORDER — ENOXAPARIN SODIUM 40 MG/0.4ML ~~LOC~~ SOLN
40.0000 mg | SUBCUTANEOUS | Status: DC
  Filled 2014-12-02 (×3): qty 0.4

## 2014-12-02 MED ORDER — PROMETHAZINE HCL 25 MG/ML IJ SOLN
25.0000 mg | Freq: Once | INTRAMUSCULAR | Status: AC
  Administered 2014-12-02: 25 mg via INTRAVENOUS
  Filled 2014-12-02: qty 1

## 2014-12-02 MED ORDER — LIDOCAINE VISCOUS 2 % MT SOLN
15.0000 mL | Freq: Once | OROMUCOSAL | Status: AC
  Administered 2014-12-02: 15 mL via OROMUCOSAL
  Filled 2014-12-02: qty 15

## 2014-12-02 MED ORDER — MORPHINE SULFATE (PF) 2 MG/ML IV SOLN
2.0000 mg | INTRAVENOUS | Status: DC | PRN
  Administered 2014-12-02: 2 mg via INTRAVENOUS
  Filled 2014-12-02: qty 1

## 2014-12-02 MED ORDER — HYDROMORPHONE HCL 1 MG/ML IJ SOLN
1.0000 mg | INTRAMUSCULAR | Status: DC | PRN
  Administered 2014-12-03: 1 mg via INTRAVENOUS
  Filled 2014-12-02: qty 1

## 2014-12-02 MED ORDER — ONDANSETRON HCL 4 MG/2ML IJ SOLN: 4.0000 mg | Freq: Four times a day (QID) | INTRAMUSCULAR | Status: DC | PRN

## 2014-12-02 MED ORDER — KCL IN DEXTROSE-NACL 20-5-0.9 MEQ/L-%-% IV SOLN
INTRAVENOUS | Status: DC
  Filled 2014-12-02 (×6): qty 1000

## 2014-12-02 MED ORDER — SODIUM CHLORIDE 0.9 % IV BOLUS (SEPSIS)
1000.0000 mL | Freq: Once | INTRAVENOUS | Status: AC
  Administered 2014-12-02: 1000 mL via INTRAVENOUS

## 2014-12-02 MED ORDER — HYDROMORPHONE HCL 1 MG/ML IJ SOLN
1.0000 mg | Freq: Once | INTRAMUSCULAR | Status: AC
  Administered 2014-12-02: 1 mg via INTRAVENOUS
  Filled 2014-12-02: qty 1

## 2014-12-02 NOTE — ED Notes (Signed)
Patient returned from CT. Requested urinal in order to urinate. No acute distress noted.

## 2014-12-02 NOTE — ED Provider Notes (Signed)
TIME SEEN: 2:02 AM   CHIEF COMPLAINT: Abdominal Pain  HPI: HPI Comments: [name] is a 44 y.o. male who presents to the Emergency Department complaining of severe abdominal pain onset 1 day prior at [facility]. Pt reports associated n/v/d and chills. Pt reports that his last normal BM was 1 day prior.  Pt reports he has had a cholecystectomy, previous colectomy and colostomy bag has been reversed. Pt denies fever, dysuria or hematuria. Patient reports he has been passing gas.  GI is Dr. Loreta Ave with [facility]   ROS: See HPI Constitutional: no fever  Eyes: no drainage  ENT: no runny nose   Cardiovascular:  no chest pain  Resp: no SOB  GI: no vomiting GU: no dysuria Integumentary: no rash  Allergy: no hives  Musculoskeletal: no leg swelling  Neurological: no slurred speech ROS otherwise negative  PAST MEDICAL HISTORY/PAST SURGICAL HISTORY:  Past Medical History  Diagnosis Date  . SBO (small bowel obstruction)     MEDICATIONS:  Prior to Admission medications   Not on File    ALLERGIES:  No Known Allergies  SOCIAL HISTORY:  Social History  Substance Use Topics  . Smoking status: Current Every Day Smoker  . Smokeless tobacco: Not on file  . Alcohol Use: No    FAMILY HISTORY: No family history on file.  EXAM: BP 123/90 mmHg  Pulse 76  Temp(Src) 97.8 F (36.6 C) (Oral)  Resp 26  Ht 6' (1.829 m)  Wt 193 lb (87.544 kg)  BMI 26.17 kg/m2  SpO2 98%   CONSTITUTIONAL: Alert and oriented and responds appropriately to questions. Appears very uncomfortable, afebrile and nontoxic HEAD: Normocephalic EYES: Conjunctivae clear, PERRL ENT: normal nose; no rhinorrhea; moist mucous membranes; pharynx without lesions noted NECK: Supple, no meningismus, no LAD  CARD: RRR; S1 and S2 appreciated; no murmurs, no clicks, no rubs, no gallops RESP: Normal chest excursion without splinting or tachypnea; breath sounds clear and equal bilaterally; no wheezes, no rhonchi, no rales, no  hypoxia or respiratory distress, speaking full sentences ABD/GI: Normal bowel sounds; non-distended; soft, diffusely tender throughout the abdomen with voluntary guarding, no rebound, no peritoneal signs BACK:  The back appears normal and is non-tender to palpation, there is no CVA tenderness EXT: Normal ROM in all joints; non-tender to palpation; no edema; normal capillary refill; no cyanosis, no calf tenderness or swelling    SKIN: Normal color for age and race; warm NEURO: Moves all extremities equally, sensation to light touch intact diffusely, cranial nerves II through XII intact PSYCH: The patient's mood and manner are appropriate. Grooming and personal hygiene are appropriate.   MEDICAL DECISION MAKING: Patient here with abdominal pain. Has had history of partial small bowel obstruction. Very tender to palpation throughout the abdomen without a surgical abdomen. He is afebrile and nontoxic. We'll give pain medication, IV fluids, antiemetics. Will obtain labs, urine, CT of his abdomen and pelvis.  ED PROGRESS: 4:40 AM Patient's CT scan shows small bowel obstruction with transition point in the right lower quadrant. He has had previous bowel obstructions managed with medical management and has not had surgery because of his obstruction. Will consult medicine. Will place NG tube. He reports he does not have a primary care provider.   5:00 AM  D/w Dr. Robb Matar who agrees on admission to a medical bed. He will consult surgery non-emergently in the morning.  I personally performed the services described in this documentation, which was scribed in my presence. The recorded information has  been reviewed and is accurate.    Layla Maw Casandra Dallaire, DO 12/02/14 0502

## 2014-12-02 NOTE — Progress Notes (Signed)
[facility] PROGRESS NOTE  [name]:[id number] DOB: 10-13-1970 DOA: 12/02/2014 PCP: No primary care provider on file.  HPI/Subjective: [name] presents to the unit from ED for severe abdominal pain and N/V/D present since Saturday PM (11/30/14). Last DM was on 11/30/14 (loose, non-bloody). Passed gas today. Denies being able to eat x2 days d/t N/V and pain. PMH significant for cholecystectomy, prior colectomy, and colostomy bag (reversed). He reports history of pain/vomiting episodes like this since surgeries in 2011. He denies current fever or chills, recent illness, or sick contacts.   Assessment/Plan: Abdominal Pain: Suspected partial SBO. CT show findings consistent with SBO w/ transition point at RLQ. Plan is to remain NPO and check KUB today to monitor movement of contrast. If contrast moving to colon, will advance to clear liquid diet. Pain control with Morphine PRN. NGT attempted without success, patient refuses additional attempts. Surgery consulted and will continue to follow, but no acute surgical indications at this time.  Nausea: Continued nausea upon arrival that was unresponsive to Phenergan. Will add Zofran PRN.   Hypokalemia: Suspected etiology is GI loss from recent V/D. Mg is WNL. Plan is to replace and re-check electrolytes.   Leukocytosis: Suspected to be result of SBO. Unlikely to be an infectious etiology, but will check UA and continue to monitor.   Tobacco use: Current smoker. Will give Nicotine patch 21 mg QD.  DVT Prophylaxis: Continue Lovenox 40 mg QD.    Code Status: Full Family Communication: Girlfriend at bedside  Disposition Plan: Home following workup    Consultants:  Surgery  Procedures:  None   Antibiotics:  None    Objective: Filed Vitals:   12/02/14 1055  BP: 110/75  Pulse: 47  Temp: 98 F (36.7 C)  Resp: 16    Intake/Output Summary (Last 24 hours) at 12/02/14 1310 Last data filed at 12/02/14 1056  Gross per 24  hour  Intake      0 ml  Output      0 ml  Net      0 ml   Filed Weights   12/02/14 0138  Weight: 87.544 kg (193 lb)    Exam:   General:  Well-nourished man, Appears stated age. Laying in bed. Mild guarding and occasional acute pain episodes.   Cardiovascular: RRR, no M/R/G  Respiratory: CTA b/l, no wheeze or crackles  Abdomen: Slightly rigid abdomen, Non-distended, Tender to palpation specifically in the suprapubic region   Musculoskeletal: Appropriate movement in all extremities   Data Reviewed: Basic Metabolic Panel:  Recent Labs Lab 12/02/14 0210 12/02/14 1126  NA 136  --   K 3.3*  --   CL 103  --   CO2 23  --   GLUCOSE 132*  --   BUN 12  --   CREATININE 1.21 1.17  CALCIUM 9.1  --   MG 2.0  --   PHOS 2.4*  --    Liver Function Tests:  Recent Labs Lab 12/02/14 0210  AST 18  ALT 16*  ALKPHOS 46  BILITOT 1.3*  PROT 7.2  ALBUMIN 4.0    Recent Labs Lab 12/02/14 0210  LIPASE 21*   No results for input(s): AMMONIA in the last 168 hours. CBC:  Recent Labs Lab 12/02/14 0303 12/02/14 1126  WBC 12.3* 7.4  NEUTROABS 9.7*  --   HGB 14.3 13.9  HCT 41.6 40.5  MCV 90.2 92.9  PLT 254 226   Cardiac Enzymes: No results for input(s): CKTOTAL, CKMB, CKMBINDEX, TROPONINI in  the last 168 hours. BNP (last 3 results) No results for input(s): BNP in the last 8760 hours.  ProBNP (last 3 results) No results for input(s): PROBNP in the last 8760 hours.  CBG: No results for input(s): GLUCAP in the last 168 hours.  No results found for this or any previous visit (from the past 240 hour(s)).   Studies: Ct Abdomen Pelvis W Contrast  12/02/2014   CLINICAL DATA:  Bilateral lower abdominal pain, nausea vomiting and diarrhea. Symptoms for 12 hours. History of small bowel obstruction and bowel resections.  EXAM: CT ABDOMEN AND PELVIS WITH CONTRAST  TECHNIQUE: Multidetector CT imaging of the abdomen and pelvis was performed using the standard protocol following  bolus administration of intravenous contrast.  CONTRAST:  OMNIPAQUE IOHEXOL 300 MG/ML  SOLN  COMPARISON:  CT 04/25/2012  FINDINGS: Hypoventilatory change at the lung bases.  Clips in the gallbladder fossa from cholecystectomy. No biliary dilatation. The liver, spleen, pancreas, and adrenal glands are normal. Kidneys demonstrate symmetric enhancement and excretion without hydronephrosis or localizing abnormality.  Enteric contrast in the distal esophagus. The stomach is physiologically distended. Proximal small bowel loops are normal in caliber. Lower abdominal and pelvic bowel loops are fluid-filled and dilated. Anastomotic chain sutures are noted in the right lower quadrant and mid right upper abdomen. There is associated small bowel thickening involving small bowel loops in the lower abdomen with adjacent mesenteric edema and free fluid. Mild mesenteric enhancement of these abnormal bowel loops. More distal small bowel loops in the right lower quadrant are decompressed. Transition point appears in the right lower quadrant, best appreciated coronal image 43. No pneumatosis or free air. Stool throughout the colon without colonic wall thickening.  No retroperitoneal adenopathy. Abdominal aorta is normal in caliber.  There are no acute or suspicious osseous abnormalities.  IMPRESSION: Findings consistent with small-bowel obstruction with transition point in the right lower quadrant.   Electronically Signed   By: Rubye Oaks M.D.   On: 12/02/2014 04:35    Scheduled Meds: . enoxaparin (LOVENOX) injection  40 mg Subcutaneous Q24H  . nicotine  21 mg Transdermal Daily   Continuous Infusions: . 0.9 % NaCl with KCl 20 mEq / L 125 mL/hr at 12/02/14 1238  . dextrose 5 % and 0.9 % NaCl with KCl 20 mEq/L      Principal Problem:   SBO (small bowel obstruction) Active Problems:   Hypokalemia    Time spent: 30 minutes    Ayanah Snader, Student-PA  [facility] If 7PM-7AM, please contact  night-coverage at [email or web address], password [facility] 12/02/2014, 1:10 PM  LOS: 0 days

## 2014-12-02 NOTE — ED Notes (Signed)
Patient in CT at this time.

## 2014-12-02 NOTE — H&P (Signed)
[facility] History and Physical  [name] ZOX:[id number] DOB: 08/28/1970 DOA: 12/02/2014  Referring physician: Layla Maw Ward, DO PCP: No primary care provider on file.   Chief Complaint: Abdominal pain  HPI: [name] is a 44 y.o. male with a past surgical history of cholecystectomy, previous colectomy, colostomy and colostomy reversal, previous small bowel obstruction who comes to the emergency department with complaints of abdominal pain since yesterday morning at 5 AM. He states that his pain is associated with nausea, emesis, diarrhea, loss of appetite and chills. He denies headache, sore throat, cough, chest pain, palpitations. He denies urinary symptoms. He states that he had a bowel movement yesterday and that he still passing flatus.  He is currently in no acute distress and denies any other complaints. He states that his pain is better.   Review of Systems:  Constitutional:  Positive chills, fatigue.  No weight loss, night sweats, Fevers,  HEENT:  No headaches, Difficulty swallowing,Tooth/dental problems,Sore throat,  No sneezing, itching, ear ache, nasal congestion, post nasal drip,  Cardio-vascular:  No chest pain, Orthopnea, PND, swelling in lower extremities, anasarca, dizziness, palpitations  GI:  Positive abdominal pain, nausea, vomiting, diarrhea, loss of appetite  Resp:  No shortness of breath with exertion or at rest. No excess mucus, no productive cough, No non-productive cough, No coughing up of blood.No change in color of mucus.No wheezing.No chest wall deformity  Skin:  no rash or lesions.  GU:  no dysuria, change in color of urine, no urgency or frequency. No flank pain.  Musculoskeletal:  No joint pain or swelling. No decreased range of motion. No back pain.  Psych:  No change in mood or affect. No depression or anxiety. No memory loss.   Past Medical History  Diagnosis Date  . SBO (small bowel obstruction)    Past Surgical History    Procedure Laterality Date  . Colon resection    . Cholecystectomy    . Arm surgery     Social History:  reports that he has been smoking.  He does not have any smokeless tobacco history on file. He reports that he does not drink alcohol or use illicit drugs.  No Known Allergies  No family history on file.   Prior to Admission medications   Medication Sig Start Date End Date Taking? Authorizing Provider  Cyanocobalamin (VITAMIN B-12 PO) Take 1 tablet by mouth daily.   Yes Historical Provider, MD  Multiple Vitamins-Minerals (ENERGY BOOSTER PO) Take 1 tablet by mouth daily.   Yes Historical Provider, MD   Physical Exam: Filed Vitals:   12/02/14 0530 12/02/14 0545 12/02/14 0600 12/02/14 0615  BP: 121/75 114/78 124/84 122/80  Pulse: 54 55 48 63  Temp:      TempSrc:      Resp:      Height:      Weight:      SpO2: 97% 96% 100% 98%    Wt Readings from Last 3 Encounters:  12/02/14 87.544 kg (193 lb)    General:  Appears calm and comfortable Eyes: PERRL, normal lids, irises & conjunctiva ENT: grossly normal hearing, lips & tongue are dry. Neck: no LAD, masses or thyromegaly Cardiovascular: RRR, no m/r/g. No LE edema. Telemetry: SR, no arrhythmias  Respiratory: CTA bilaterally, no w/r/r. Normal respiratory effort. Abdomen: Multiple surgical scars, bowel sounds positive, soft, positive epigastric tenderness without guarding or rebound tenderness. Skin: no rash or induration seen on limited exam Musculoskeletal: grossly normal tone BUE/BLE Psychiatric: Somnolent,  but arousable, speech fluent and appropriate Neurologic: grossly non-focal.          Labs on Admission:  Basic Metabolic Panel:  Recent Labs Lab 12/02/14 0210  NA 136  K 3.3*  CL 103  CO2 23  GLUCOSE 132*  BUN 12  CREATININE 1.21  CALCIUM 9.1   Liver Function Tests:  Recent Labs Lab 12/02/14 0210  AST 18  ALT 16*  ALKPHOS 46  BILITOT 1.3*  PROT 7.2  ALBUMIN 4.0    Recent Labs Lab  12/02/14 0210  LIPASE 21*   CBC:  Recent Labs Lab 12/02/14 0303  WBC 12.3*  NEUTROABS 9.7*  HGB 14.3  HCT 41.6  MCV 90.2  PLT 254    Radiological Exams on Admission: Ct Abdomen Pelvis W Contrast  12/02/2014   CLINICAL DATA:  Bilateral lower abdominal pain, nausea vomiting and diarrhea. Symptoms for 12 hours. History of small bowel obstruction and bowel resections.  EXAM: CT ABDOMEN AND PELVIS WITH CONTRAST  TECHNIQUE: Multidetector CT imaging of the abdomen and pelvis was performed using the standard protocol following bolus administration of intravenous contrast.  CONTRAST:  OMNIPAQUE IOHEXOL 300 MG/ML  SOLN  COMPARISON:  CT 04/25/2012  FINDINGS: Hypoventilatory change at the lung bases.  Clips in the gallbladder fossa from cholecystectomy. No biliary dilatation. The liver, spleen, pancreas, and adrenal glands are normal. Kidneys demonstrate symmetric enhancement and excretion without hydronephrosis or localizing abnormality.  Enteric contrast in the distal esophagus. The stomach is physiologically distended. Proximal small bowel loops are normal in caliber. Lower abdominal and pelvic bowel loops are fluid-filled and dilated. Anastomotic chain sutures are noted in the right lower quadrant and mid right upper abdomen. There is associated small bowel thickening involving small bowel loops in the lower abdomen with adjacent mesenteric edema and free fluid. Mild mesenteric enhancement of these abnormal bowel loops. More distal small bowel loops in the right lower quadrant are decompressed. Transition point appears in the right lower quadrant, best appreciated coronal image 43. No pneumatosis or free air. Stool throughout the colon without colonic wall thickening.  No retroperitoneal adenopathy. Abdominal aorta is normal in caliber.  There are no acute or suspicious osseous abnormalities.  IMPRESSION: Findings consistent with small-bowel obstruction with transition point in the right lower  quadrant.   Electronically Signed   By: Rubye Oaks M.D.   On: 12/02/2014 04:35     Assessment/Plan Principal Problem:   SBO (small bowel obstruction) Admit to MedSurg. Keep nothing by mouth. NGT suction. Consults surgery later today.  Active Problems:   Hypokalemia Continue replacement and follow-up level. Check magnesium level.  Code Status: Full code. DVT Prophylaxis: SCDs. Family Communication:  Disposition Plan: Admit to MedSurg, keep nothing by mouth and consult surgery later today.  Time spent: Over 50 minutes were spent during this admission.  Bobette Mo [facility] Pager [phone].

## 2014-12-02 NOTE — Progress Notes (Signed)
Repeat films this afternoon show contrast through his colon to his rectum.  We will give him clear liquids and his diet can likely be advanced as tolerates.  He has no evidence of a bowel obstruction.  His small bowel is thickened on his CT scan to suggest he has some problem.  He will need to follow up with Dr. Loreta Ave, with GI, for further recommendations given the frequency of his attacks.  [name] E 3:39 PM 12/02/2014

## 2014-12-02 NOTE — Progress Notes (Signed)
Patient arrived to room from ED alert. Safety precautions and orders reviewed with patient. MD aware of his arrival and will see him shortly. Patient appears in no distress at this time. Will continue to monitor.  Sim Boast, RN

## 2014-12-02 NOTE — ED Notes (Signed)
Attempted to place NG tube in right nare after use of lidocaine, advance ng tube approximately 2 inches and patient required staff to stop. Showed the patient how far advanced and option for smaller size, from 18fr to 39fr. Patient still declined.

## 2014-12-02 NOTE — ED Notes (Signed)
MD at bedside. 

## 2014-12-02 NOTE — ED Notes (Signed)
Internal medicine physician in room with patient at this time.

## 2014-12-02 NOTE — Consult Note (Signed)
[name] 20-Jul-1970  [id number].   Primary GI: Dr. Collene Mares Requesting MD: Dr. Oren Binet Chief Complaint/Reason for Consult: sbo HPI: This is a 44 yo white male who underwent a sigmoid colectomy with anastomosis in 2011.  He developed an anastomotic leak and required another operation to repair the anastomosis and give him a diverting loop ileostomy.  Later than year he had this takedown down.  Ever since then he states he has these "episodes" of severe abdominal pain for 5-8 hours at a time with nausea and vomiting.  He also continued to pass flatus and have either regular BMs or diarrhea.  Initially, these episodes were several months apart, but have become more frequent in nature.  He is followed by Dr. Collene Mares.  He states she doesn't know what these occurences are related to.  This past Saturday, he developed an episodes with N/V and some diarrhea.  He then had another one on Sunday, which is why he decided to come to the ED.  He last had a BM last night here in the ED.  He underwent a CT scan which shows thickening of his distal small bowel and a possible transition point in the TI.  They tried to get an NGT in place, but were unable to.  He has been admitted by the hospitalist and we have been asked to see him.  ROS : Please see HPI, otherwise all other systems are negative  History reviewed. No pertinent family history.  Past Medical History  Diagnosis Date  . Diverticulitis     Past Surgical History  Procedure Laterality Date  . Colon resection    . Cholecystectomy    . Arm surgery    . Loop ileostomy and repair of anastomotic leak at sigmoid colon  2011  . Ileostomy takedown  2011    Social History:  reports that he has been smoking.  He does not have any smokeless tobacco history on file. He reports that he does not drink alcohol or use illicit drugs.  Allergies: No Known Allergies   (Not in a hospital admission)  Blood pressure 111/77, pulse 56, temperature 97.8 F  (36.6 C), temperature source Oral, resp. rate 26, height 6' (1.829 m), weight 87.544 kg (193 lb), SpO2 97 %. Physical Exam: General: pleasant, WD, WN white male who is laying in bed in NAD HEENT: head is normocephalic, atraumatic.  Sclera are noninjected.  PERRL.  Ears and nose without any masses or lesions.  Mouth is pink and moist Heart: regular, rate, and rhythm.  Normal s1,s2. No obvious murmurs, gallops, or rubs noted.  Palpable radial and pedal pulses bilaterally Lungs: CTAB, no wheezes, rhonchi, or rales noted.  Respiratory effort nonlabored Abd: soft, tender in lower abdomen, ND, hypoactive BS, no masses or organomegaly. No palpable hernias, but multiple scars from prior surgeries. MS: all 4 extremities are symmetrical with no cyanosis, clubbing, or edema. Skin: warm and dry with no masses, lesions, or rashes Psych: A&Ox3 with an appropriate affect.    Results for orders placed or performed during the hospital encounter of 12/02/14 (from the past 48 hour(s))  Lipase, blood     Status: Abnormal   Collection Time: 12/02/14  2:10 AM  Result Value Ref Range   Lipase 21 (L) 22 - 51 U/L  Comprehensive metabolic panel     Status: Abnormal   Collection Time: 12/02/14  2:10 AM  Result Value Ref Range   Sodium 136 135 - 145 mmol/L   Potassium 3.3 (  L) 3.5 - 5.1 mmol/L   Chloride 103 101 - 111 mmol/L   CO2 23 22 - 32 mmol/L   Glucose, Bld 132 (H) 65 - 99 mg/dL   BUN 12 6 - 20 mg/dL   Creatinine, Ser 1.21 0.61 - 1.24 mg/dL   Calcium 9.1 8.9 - 10.3 mg/dL   Total Protein 7.2 6.5 - 8.1 g/dL   Albumin 4.0 3.5 - 5.0 g/dL   AST 18 15 - 41 U/L   ALT 16 (L) 17 - 63 U/L   Alkaline Phosphatase 46 38 - 126 U/L   Total Bilirubin 1.3 (H) 0.3 - 1.2 mg/dL   GFR calc non Af Amer >60 >60 mL/min   GFR calc Af Amer >60 >60 mL/min    Comment: (NOTE) The eGFR has been calculated using the CKD EPI equation. This calculation has not been validated in all clinical situations. eGFR's persistently <60  mL/min signify possible Chronic Kidney Disease.    Anion gap 10 5 - 15  Magnesium     Status: None   Collection Time: 12/02/14  2:10 AM  Result Value Ref Range   Magnesium 2.0 1.7 - 2.4 mg/dL  Phosphorus     Status: Abnormal   Collection Time: 12/02/14  2:10 AM  Result Value Ref Range   Phosphorus 2.4 (L) 2.5 - 4.6 mg/dL  CBC with Differential     Status: Abnormal   Collection Time: 12/02/14  3:03 AM  Result Value Ref Range   WBC 12.3 (H) 4.0 - 10.5 K/uL   RBC 4.61 4.22 - 5.81 MIL/uL   Hemoglobin 14.3 13.0 - 17.0 g/dL   HCT 41.6 39.0 - 52.0 %   MCV 90.2 78.0 - 100.0 fL   MCH 31.0 26.0 - 34.0 pg   MCHC 34.4 30.0 - 36.0 g/dL   RDW 13.0 11.5 - 15.5 %   Platelets 254 150 - 400 K/uL   Neutrophils Relative % 79 (H) 43 - 77 %   Neutro Abs 9.7 (H) 1.7 - 7.7 K/uL   Lymphocytes Relative 11 (L) 12 - 46 %   Lymphs Abs 1.4 0.7 - 4.0 K/uL   Monocytes Relative 9 3 - 12 %   Monocytes Absolute 1.1 (H) 0.1 - 1.0 K/uL   Eosinophils Relative 1 0 - 5 %   Eosinophils Absolute 0.1 0.0 - 0.7 K/uL   Basophils Relative 0 0 - 1 %   Basophils Absolute 0.0 0.0 - 0.1 K/uL  Urinalysis, Routine w reflex microscopic (not at [facility])     Status: None   Collection Time: 12/02/14  6:55 AM  Result Value Ref Range   Color, Urine YELLOW YELLOW   APPearance CLEAR CLEAR   Specific Gravity, Urine 1.017 1.005 - 1.030   pH 6.5 5.0 - 8.0   Glucose, UA NEGATIVE NEGATIVE mg/dL   Hgb urine dipstick NEGATIVE NEGATIVE   Bilirubin Urine NEGATIVE NEGATIVE   Ketones, ur NEGATIVE NEGATIVE mg/dL   Protein, ur NEGATIVE NEGATIVE mg/dL   Urobilinogen, UA 0.2 0.0 - 1.0 mg/dL   Nitrite NEGATIVE NEGATIVE   Leukocytes, UA NEGATIVE NEGATIVE    Comment: MICROSCOPIC NOT DONE ON URINES WITH NEGATIVE PROTEIN, BLOOD, LEUKOCYTES, NITRITE, OR GLUCOSE <1000 mg/dL.   Ct Abdomen Pelvis W Contrast  12/02/2014   CLINICAL DATA:  Bilateral lower abdominal pain, nausea vomiting and diarrhea. Symptoms for 12 hours. History of small bowel  obstruction and bowel resections.  EXAM: CT ABDOMEN AND PELVIS WITH CONTRAST  TECHNIQUE: Multidetector CT imaging of the  abdomen and pelvis was performed using the standard protocol following bolus administration of intravenous contrast.  CONTRAST:  122m OMNIPAQUE IOHEXOL 300 MG/ML  SOLN  COMPARISON:  CT 04/25/2012  FINDINGS: Hypoventilatory change at the lung bases.  Clips in the gallbladder fossa from cholecystectomy. No biliary dilatation. The liver, spleen, pancreas, and adrenal glands are normal. Kidneys demonstrate symmetric enhancement and excretion without hydronephrosis or localizing abnormality.  Enteric contrast in the distal esophagus. The stomach is physiologically distended. Proximal small bowel loops are normal in caliber. Lower abdominal and pelvic bowel loops are fluid-filled and dilated. Anastomotic chain sutures are noted in the right lower quadrant and mid right upper abdomen. There is associated small bowel thickening involving small bowel loops in the lower abdomen with adjacent mesenteric edema and free fluid. Mild mesenteric enhancement of these abnormal bowel loops. More distal small bowel loops in the right lower quadrant are decompressed. Transition point appears in the right lower quadrant, best appreciated coronal image 43. No pneumatosis or free air. Stool throughout the colon without colonic wall thickening.  No retroperitoneal adenopathy. Abdominal aorta is normal in caliber.  There are no acute or suspicious osseous abnormalities.  IMPRESSION: Findings consistent with small-bowel obstruction with transition point in the right lower quadrant.   Electronically Signed   By: MJeb LeveringM.D.   On: 12/02/2014 04:35       Assessment/Plan 1. pSBO -It is possible the patient has a partial small bowel obstruction, but he also has a lot of thickening of his small bowel on his CT scan which is concerning for a possible enteritis as well.  He is not completed obstructed as he is  passing flatus and having BMs still.  We will recheck a KUB this afternoon.  If contrast has progressed to his colon then he could be given some clear liquids possibly.   -he very likely may need further GI workup as an outpatient  -no acute surgical indications -we will follow -recommend an NGT but an attempt has been made and failed and the patient adamantly refuses any further attempts. OSBORNE,KELLY E 12/02/2014, 8:12 AM Pager: [phone]

## 2014-12-02 NOTE — ED Notes (Signed)
Pt. reports mid / low abdominal pain with emesis and diarrhea onset yesterday , denies fever or chills.  

## 2014-12-03 LAB — COMPREHENSIVE METABOLIC PANEL
ALBUMIN: 3.1 g/dL — AB (ref 3.5–5.0)
ALT: 13 U/L — ABNORMAL LOW (ref 17–63)
ANION GAP: 5 (ref 5–15)
AST: 17 U/L (ref 15–41)
Alkaline Phosphatase: 46 U/L (ref 38–126)
BILIRUBIN TOTAL: 0.7 mg/dL (ref 0.3–1.2)
BUN: 11 mg/dL (ref 6–20)
CO2: 25 mmol/L (ref 22–32)
Calcium: 8.3 mg/dL — ABNORMAL LOW (ref 8.9–10.3)
Chloride: 107 mmol/L (ref 101–111)
Creatinine, Ser: 1.19 mg/dL (ref 0.61–1.24)
GFR calc Af Amer: >60 mL/min (ref >60)
GFR calc non Af Amer: >60 mL/min (ref >60)
GLUCOSE: 87 mg/dL (ref 65–99)
POTASSIUM: 4.3 mmol/L (ref 3.5–5.1)
Sodium: 137 mmol/L (ref 135–145)
TOTAL PROTEIN: 5.7 g/dL — AB (ref 6.5–8.1)

## 2014-12-03 LAB — CBC
HEMATOCRIT: 42.5 % (ref 39.0–52.0)
HEMOGLOBIN: 13.8 g/dL (ref 13.0–17.0)
MCH: 30.5 pg (ref 26.0–34.0)
MCHC: 32.5 g/dL (ref 30.0–36.0)
MCV: 93.8 fL (ref 78.0–100.0)
Platelets: 245 10*3/uL (ref 150–400)
RBC: 4.53 MIL/uL (ref 4.22–5.81)
RDW: 13.2 % (ref 11.5–15.5)
WBC: 7.6 10*3/uL (ref 4.0–10.5)

## 2014-12-03 NOTE — Discharge Summary (Signed)
PATIENT DETAILS Name: [name] Age: 44 y.o. Sex: male Date of Birth: Mar 27, 1970 MRN: [id number]. Admitting Physician: Bobette Mo, MD PCP:No primary care provider on file.  Admit Date: 12/02/2014 Discharge date: 12/03/2014  Recommendations for Outpatient Follow-up:  1. FU with PCP for smoking cessation. 2. FU with Dr. Loreta Ave for evaluation of episodic abdominal pain attacks.   PRIMARY DISCHARGE DIAGNOSIS:  Principal Problem:   SBO (small bowel obstruction) Active Problems:   Hypokalemia      PAST MEDICAL HISTORY: Past Medical History  Diagnosis Date  . Diverticulitis     DISCHARGE MEDICATIONS: Current Discharge Medication List    CONTINUE these medications which have NOT CHANGED   Details  Cyanocobalamin (VITAMIN B-12 PO) Take 1 tablet by mouth daily.    Multiple Vitamins-Minerals (ENERGY BOOSTER PO) Take 1 tablet by mouth daily.        ALLERGIES:  No Known Allergies  BRIEF HPI: [name] presented to the unit from ED for severe abdominal pain and N/V/D present since Saturday PM (11/30/14). Admission to unit for workup of etiology. PMH significant for cholecystectomy, prior colectomy, and colostomy bag (reversed). He reports history of pain/vomiting episodes like this since surgeries in 2011. See hospital course for workup findings.   CONSULTATIONS:  Surgery  PERTINENT RADIOLOGIC STUDIES: Ct Abdomen Pelvis W Contrast  12/02/2014   CLINICAL DATA:  Bilateral lower abdominal pain, nausea vomiting and diarrhea. Symptoms for 12 hours. History of small bowel obstruction and bowel resections.  EXAM: CT ABDOMEN AND PELVIS WITH CONTRAST  TECHNIQUE: Multidetector CT imaging of the abdomen and pelvis was performed using the standard protocol following bolus administration of intravenous contrast.  CONTRAST:  OMNIPAQUE IOHEXOL 300 MG/ML  SOLN  COMPARISON:  CT 04/25/2012  FINDINGS: Hypoventilatory change at the lung bases.  Clips in the gallbladder fossa from  cholecystectomy. No biliary dilatation. The liver, spleen, pancreas, and adrenal glands are normal. Kidneys demonstrate symmetric enhancement and excretion without hydronephrosis or localizing abnormality.  Enteric contrast in the distal esophagus. The stomach is physiologically distended. Proximal small bowel loops are normal in caliber. Lower abdominal and pelvic bowel loops are fluid-filled and dilated. Anastomotic chain sutures are noted in the right lower quadrant and mid right upper abdomen. There is associated small bowel thickening involving small bowel loops in the lower abdomen with adjacent mesenteric edema and free fluid. Mild mesenteric enhancement of these abnormal bowel loops. More distal small bowel loops in the right lower quadrant are decompressed. Transition point appears in the right lower quadrant, best appreciated coronal image 43. No pneumatosis or free air. Stool throughout the colon without colonic wall thickening.  No retroperitoneal adenopathy. Abdominal aorta is normal in caliber.  There are no acute or suspicious osseous abnormalities.  IMPRESSION: Findings consistent with small-bowel obstruction with transition point in the right lower quadrant.   Electronically Signed   By: Rubye Oaks M.D.   On: 12/02/2014 04:35   Dg Abd Portable 1v  12/02/2014   CLINICAL DATA:  Small bowel obstruction.  EXAM: PORTABLE ABDOMEN - 1 VIEW  COMPARISON:  CT, 12/02/2014  FINDINGS: Dilated small bowel loops noted on the current CT on 9 appreciated on this single supine abdominal radiograph. They may be fluid filled and therefore not well-defined. Contrast is seen in a normal caliber colon with contrast also noted in a partly distended bladder.  There has been a previous cholecystectomy. Soft tissues are otherwise unremarkable.  No significant bony abnormality.  IMPRESSION: The partial small bowel  obstruction noted on the current CT is not evident radiographically. No acute findings noted on the  current portable supine abdominal radiograph.   Electronically Signed   By: Amie Portland M.D.   On: 12/02/2014 15:12     PERTINENT LAB RESULTS: CBC:  Recent Labs  12/02/14 1126 12/03/14 0653  WBC 7.4 7.6  HGB 13.9 13.8  HCT 40.5 42.5  PLT 226 245   CMET CMP     Component Value Date/Time   NA 137 12/03/2014 0653   NA 139 04/24/2012 2337   K 4.3 12/03/2014 0653   K 3.8 04/24/2012 2337   CL 107 12/03/2014 0653   CL 108* 04/24/2012 2337   CO2 25 12/03/2014 0653   CO2 23 04/24/2012 2337   GLUCOSE 87 12/03/2014 0653   GLUCOSE 126* 04/24/2012 2337   BUN 11 12/03/2014 0653   BUN 14 04/24/2012 2337   CREATININE 1.19 12/03/2014 0653   CREATININE 1.11 04/24/2012 2337   CALCIUM 8.3* 12/03/2014 0653   CALCIUM 9.9 04/24/2012 2337   PROT 5.7* 12/03/2014 0653   PROT 8.4* 04/24/2012 2337   ALBUMIN 3.1* 12/03/2014 0653   ALBUMIN 4.2 04/24/2012 2337   AST 17 12/03/2014 0653   AST 29 04/24/2012 2337   ALT 13* 12/03/2014 0653   ALT 23 04/24/2012 2337   ALKPHOS 46 12/03/2014 0653   ALKPHOS 73 04/24/2012 2337   BILITOT 0.7 12/03/2014 0653   BILITOT 1.5* 04/24/2012 2337   GFRNONAA >60 12/03/2014 0653   GFRNONAA >60 04/24/2012 2337   GFRAA >60 12/03/2014 0653   GFRAA >60 04/24/2012 2337    GFR Estimated Creatinine Clearance: 86 mL/min (by C-G formula based on Cr of 1.19).  Recent Labs  12/02/14 0210  LIPASE 21*   No results for input(s): CKTOTAL, CKMB, CKMBINDEX, TROPONINI in the last 72 hours. Invalid input(s): POCBNP No results for input(s): DDIMER in the last 72 hours. No results for input(s): HGBA1C in the last 72 hours. No results for input(s): CHOL, HDL, LDLCALC, TRIG, CHOLHDL, LDLDIRECT in the last 72 hours. No results for input(s): TSH, T4TOTAL, T3FREE, THYROIDAB in the last 72 hours.  Invalid input(s): FREET3 No results for input(s): VITAMINB12, FOLATE, FERRITIN, TIBC, IRON, RETICCTPCT in the last 72 hours. Coags: No results for input(s): INR in the last 72  hours.  Invalid input(s): PT Microbiology: No results found for this or any previous visit (from the past 240 hour(s)).   BRIEF HOSPITAL COURSE:  Principle Problem:  Abdominal Pain: Etiology suspected partial SBO d/t CT findings consistent with partial SBO. Symptoms have resolved- patient able to tolerate solid foods and has had 2 BM today. No acute surgical indications at this time. Currently stable for discharge home. Plan is to FU with GI physician to determine etiology of continued pain attacks. No pain medication needed on discharge.  Active Problems:  Nausea: Resolved. No treatment needed on discharge.   Hypokalemia: Resolved. Suspected etiology GI loss from recent V/D. No treatment needed on discharge.  Leukocytosis: Resolved. Suspected to be result of SBO. UA WNL. No treatment needed on discharge.   Tobacco use: Current smoker. Educated the patient on smoking cessation.   DVT Prophylaxis: Discontinue Lovenox 40 mg QD on discharge.     TODAY-DAY OF DISCHARGE:  Subjective:   Bryor Sinning complains of no abdominal pain today. He been able to eat solid food and has had 2 BM today. Expresses desire to go home today.   Objective:   Blood pressure 116/61, pulse 74, temperature 97.5  F (36.4 C), temperature source Oral, resp. rate 20, height 6' (1.829 m), weight 87.544 kg (193 lb), SpO2 98 %.  Intake/Output Summary (Last 24 hours) at 12/03/14 0849 Last data filed at 12/03/14 0840  Gross per 24 hour  Intake 2062.5 ml  Output      0 ml  Net 2062.5 ml   Filed Weights   12/02/14 0138  Weight: 87.544 kg (193 lb)    Exam Awake Alert, Oriented *3, No new F.N deficits, Normal affect Martin.AT,PERRAL Supple Neck,No JVD, No cervical lymphadenopathy appriciated.  Symmetrical Chest wall movement, Good air movement bilaterally, CTAB RRR,No Gallops,Rubs or new Murmurs, No Parasternal Heave +ve B.Sounds, Abd Soft, Non tender, No organomegaly appriciated, No rebound -guarding or  rigidity. No Cyanosis, Clubbing or edema, No new Rash or bruise  DISCHARGE CONDITION: Stable  DISPOSITION: Home  DISCHARGE INSTRUCTIONS:    Activity:  As tolerated   Get Medicines reviewed and adjusted: Please take all your medications with you for your next visit with your Primary MD  Please request your Primary MD to go over all hospital tests and procedure/radiological results at the follow up, please ask your Primary MD to get all Hospital records sent to his/her office.  If you experience worsening of your admission symptoms, develop shortness of breath, life threatening emergency, suicidal or homicidal thoughts you must seek medical attention immediately by calling 911 or calling your MD immediately  if symptoms less severe.  You must read complete instructions/literature along with all the possible adverse reactions/side effects for all the Medicines you take and that have been prescribed to you. Take any new Medicines after you have completely understood and accpet all the possible adverse reactions/side effects.   Do not drive when taking Pain medications.   Do not take more than prescribed Pain, Sleep and Anxiety Medications  Special Instructions: If you have smoked or chewed Tobacco  in the last 2 yrs please stop smoking, stop any regular Alcohol  and or any Recreational drug use.  Wear Seat belts while driving.  Please note  You were cared for by a hospitalist during your hospital stay. Once you are discharged, your primary care physician will handle any further medical issues. Please note that NO REFILLS for any discharge medications will be authorized once you are discharged, as it is imperative that you return to your primary care physician (or establish a relationship with a primary care physician if you do not have one) for your aftercare needs so that they can reassess your need for medications and monitor your lab values.   Diet recommendation: High fiber diet    Discharge Instructions    Diet general    Complete by:  As directed      Increase activity slowly    Complete by:  As directed            Follow-up Information    Follow up with MANN,JYOTHI, MD. Schedule an appointment as soon as possible for a visit in 1 week.   Specialty:  Gastroenterology   Contact information:   [address], [place] [address] [phone]         Total Time spent on discharge equals 25 minutes.  SignedDarlin Priestly [facility] 12/03/2014 8:49 AM

## 2014-12-03 NOTE — Progress Notes (Signed)
Pt has requested to be on a regular diet to see how he tolerates food while being in the hospital.  Will continue to monitor.  Estanislado Emms, RN

## 2014-12-03 NOTE — Discharge Instructions (Signed)

## 2014-12-03 NOTE — Progress Notes (Signed)
Patient discharged home. RN discussed discharge instructions including scheduled follow up appointment with Dr. Loreta Ave, constipation prevention. Patient vocalized understanding. Discharge instructions given to patient. Patient decline wheelchair escort with staff to leave.

## 2014-12-03 NOTE — Progress Notes (Addendum)
Per patient, Dr. Loreta Ave is patient's PCP. Patient stated he would only go to Dr. Loreta Ave for pcp follow up.   Addendum: follow up appointment with Dr. Loreta Ave scheduled for Thursday, Sept 22 at 10:45 AM. Patient notified and written on discharge instructions.

## 2014-12-03 NOTE — Progress Notes (Signed)
Patient ID: [name], male   DOB: [birth year], 44 y.o.   MRN: [id number]     [address] [place], [place] [address]    Phone: [phone] FAX: [phone]     Subjective: Having pain, having BMs and eating breakfast, hungry.    Objective:  Vital signs:  Filed Vitals:   12/02/14 1802 12/02/14 2109 12/03/14 0134 12/03/14 0526  BP: 130/83 121/72 119/64 116/61  Pulse: 54 50 46 74  Temp: 98.2 F (36.8 C) 97.3 F (36.3 C) 98 F (36.7 C) 97.5 F (36.4 C)  TempSrc: Oral Oral Oral Oral  Resp: $Remo'18 18 18 20  'oCTlG$ Height:      Weight:      SpO2: 97% 95% 97% 98%    Last BM Date: 12/02/14  Intake/Output   Yesterday:  09/12 0701 - 09/13 0700 In: 1822.5 [P.O.:360; I.V.:1462.5] Out: -  This shift:    I/O last 3 completed shifts: In: 1822.5 [P.O.:360; I.V.:1462.5] Out: -     Physical Exam: General: Pt awake/alert/oriented x4 in no acute distress Abdomen: Soft.  Nondistended.  Generalized ttp.  No evidence of peritonitis.  No incarcerated hernias.    Problem List:   Principal Problem:   SBO (small bowel obstruction) Active Problems:   Hypokalemia    Results:   Labs: Results for orders placed or performed during the hospital encounter of 12/02/14 (from the past 48 hour(s))  Lipase, blood     Status: Abnormal   Collection Time: 12/02/14  2:10 AM  Result Value Ref Range   Lipase 21 (L) 22 - 51 U/L  Comprehensive metabolic panel     Status: Abnormal   Collection Time: 12/02/14  2:10 AM  Result Value Ref Range   Sodium 136 135 - 145 mmol/L   Potassium 3.3 (L) 3.5 - 5.1 mmol/L   Chloride 103 101 - 111 mmol/L   CO2 23 22 - 32 mmol/L   Glucose, Bld 132 (H) 65 - 99 mg/dL   BUN 12 6 - 20 mg/dL   Creatinine, Ser 1.21 0.61 - 1.24 mg/dL   Calcium 9.1 8.9 - 10.3 mg/dL   Total Protein 7.2 6.5 - 8.1 g/dL   Albumin 4.0 3.5 - 5.0 g/dL   AST 18 15 - 41 U/L   ALT 16 (L) 17 - 63 U/L   Alkaline Phosphatase 46 38  - 126 U/L   Total Bilirubin 1.3 (H) 0.3 - 1.2 mg/dL   GFR calc non Af Amer >60 >60 mL/min   GFR calc Af Amer >60 >60 mL/min    Comment: (NOTE) The eGFR has been calculated using the CKD EPI equation. This calculation has not been validated in all clinical situations. eGFR's persistently <60 mL/min signify possible Chronic Kidney Disease.    Anion gap 10 5 - 15  Magnesium     Status: None   Collection Time: 12/02/14  2:10 AM  Result Value Ref Range   Magnesium 2.0 1.7 - 2.4 mg/dL  Phosphorus     Status: Abnormal   Collection Time: 12/02/14  2:10 AM  Result Value Ref Range   Phosphorus 2.4 (L) 2.5 - 4.6 mg/dL  CBC with Differential     Status: Abnormal   Collection Time: 12/02/14  3:03 AM  Result Value Ref Range   WBC 12.3 (H) 4.0 - 10.5 K/uL   RBC 4.61 4.22 - 5.81 MIL/uL   Hemoglobin 14.3 13.0 - 17.0 g/dL  HCT 41.6 39.0 - 52.0 %   MCV 90.2 78.0 - 100.0 fL   MCH 31.0 26.0 - 34.0 pg   MCHC 34.4 30.0 - 36.0 g/dL   RDW 13.0 11.5 - 15.5 %   Platelets 254 150 - 400 K/uL   Neutrophils Relative % 79 (H) 43 - 77 %   Neutro Abs 9.7 (H) 1.7 - 7.7 K/uL   Lymphocytes Relative 11 (L) 12 - 46 %   Lymphs Abs 1.4 0.7 - 4.0 K/uL   Monocytes Relative 9 3 - 12 %   Monocytes Absolute 1.1 (H) 0.1 - 1.0 K/uL   Eosinophils Relative 1 0 - 5 %   Eosinophils Absolute 0.1 0.0 - 0.7 K/uL   Basophils Relative 0 0 - 1 %   Basophils Absolute 0.0 0.0 - 0.1 K/uL  Urinalysis, Routine w reflex microscopic (not at [facility])     Status: None   Collection Time: 12/02/14  6:55 AM  Result Value Ref Range   Color, Urine YELLOW YELLOW   APPearance CLEAR CLEAR   Specific Gravity, Urine 1.017 1.005 - 1.030   pH 6.5 5.0 - 8.0   Glucose, UA NEGATIVE NEGATIVE mg/dL   Hgb urine dipstick NEGATIVE NEGATIVE   Bilirubin Urine NEGATIVE NEGATIVE   Ketones, ur NEGATIVE NEGATIVE mg/dL   Protein, ur NEGATIVE NEGATIVE mg/dL   Urobilinogen, UA 0.2 0.0 - 1.0 mg/dL   Nitrite NEGATIVE NEGATIVE   Leukocytes, UA NEGATIVE NEGATIVE     Comment: MICROSCOPIC NOT DONE ON URINES WITH NEGATIVE PROTEIN, BLOOD, LEUKOCYTES, NITRITE, OR GLUCOSE <1000 mg/dL.  CBC     Status: None   Collection Time: 12/02/14 11:26 AM  Result Value Ref Range   WBC 7.4 4.0 - 10.5 K/uL   RBC 4.36 4.22 - 5.81 MIL/uL   Hemoglobin 13.9 13.0 - 17.0 g/dL   HCT 40.5 39.0 - 52.0 %   MCV 92.9 78.0 - 100.0 fL   MCH 31.9 26.0 - 34.0 pg   MCHC 34.3 30.0 - 36.0 g/dL   RDW 13.2 11.5 - 15.5 %   Platelets 226 150 - 400 K/uL  Creatinine, serum     Status: None   Collection Time: 12/02/14 11:26 AM  Result Value Ref Range   Creatinine, Ser 1.17 0.61 - 1.24 mg/dL   GFR calc non Af Amer >60 >60 mL/min   GFR calc Af Amer >60 >60 mL/min    Comment: (NOTE) The eGFR has been calculated using the CKD EPI equation. This calculation has not been validated in all clinical situations. eGFR's persistently <60 mL/min signify possible Chronic Kidney Disease.   Urinalysis, dipstick only     Status: None   Collection Time: 12/02/14  8:28 PM  Result Value Ref Range   Color, Urine YELLOW YELLOW   APPearance CLEAR CLEAR   Specific Gravity, Urine 1.008 1.005 - 1.030   pH 6.0 5.0 - 8.0   Glucose, UA NEGATIVE NEGATIVE mg/dL   Hgb urine dipstick NEGATIVE NEGATIVE   Bilirubin Urine NEGATIVE NEGATIVE   Ketones, ur NEGATIVE NEGATIVE mg/dL   Protein, ur NEGATIVE NEGATIVE mg/dL   Urobilinogen, UA 0.2 0.0 - 1.0 mg/dL   Nitrite NEGATIVE NEGATIVE   Leukocytes, UA NEGATIVE NEGATIVE  CBC     Status: None   Collection Time: 12/03/14  6:53 AM  Result Value Ref Range   WBC 7.6 4.0 - 10.5 K/uL   RBC 4.53 4.22 - 5.81 MIL/uL   Hemoglobin 13.8 13.0 - 17.0 g/dL   HCT 42.5 39.0 -  52.0 %   MCV 93.8 78.0 - 100.0 fL   MCH 30.5 26.0 - 34.0 pg   MCHC 32.5 30.0 - 36.0 g/dL   RDW 13.2 11.5 - 15.5 %   Platelets 245 150 - 400 K/uL    Imaging / Studies: Ct Abdomen Pelvis W Contrast  12/02/2014   CLINICAL DATA:  Bilateral lower abdominal pain, nausea vomiting and diarrhea. Symptoms for 12  hours. History of small bowel obstruction and bowel resections.  EXAM: CT ABDOMEN AND PELVIS WITH CONTRAST  TECHNIQUE: Multidetector CT imaging of the abdomen and pelvis was performed using the standard protocol following bolus administration of intravenous contrast.  CONTRAST:  148mL OMNIPAQUE IOHEXOL 300 MG/ML  SOLN  COMPARISON:  CT 04/25/2012  FINDINGS: Hypoventilatory change at the lung bases.  Clips in the gallbladder fossa from cholecystectomy. No biliary dilatation. The liver, spleen, pancreas, and adrenal glands are normal. Kidneys demonstrate symmetric enhancement and excretion without hydronephrosis or localizing abnormality.  Enteric contrast in the distal esophagus. The stomach is physiologically distended. Proximal small bowel loops are normal in caliber. Lower abdominal and pelvic bowel loops are fluid-filled and dilated. Anastomotic chain sutures are noted in the right lower quadrant and mid right upper abdomen. There is associated small bowel thickening involving small bowel loops in the lower abdomen with adjacent mesenteric edema and free fluid. Mild mesenteric enhancement of these abnormal bowel loops. More distal small bowel loops in the right lower quadrant are decompressed. Transition point appears in the right lower quadrant, best appreciated coronal image 43. No pneumatosis or free air. Stool throughout the colon without colonic wall thickening.  No retroperitoneal adenopathy. Abdominal aorta is normal in caliber.  There are no acute or suspicious osseous abnormalities.  IMPRESSION: Findings consistent with small-bowel obstruction with transition point in the right lower quadrant.   Electronically Signed   By: Jeb Levering M.D.   On: 12/02/2014 04:35   Dg Abd Portable 1v  12/02/2014   CLINICAL DATA:  Small bowel obstruction.  EXAM: PORTABLE ABDOMEN - 1 VIEW  COMPARISON:  CT, 12/02/2014  FINDINGS: Dilated small bowel loops noted on the current CT on 9 appreciated on this single supine  abdominal radiograph. They may be fluid filled and therefore not well-defined. Contrast is seen in a normal caliber colon with contrast also noted in a partly distended bladder.  There has been a previous cholecystectomy. Soft tissues are otherwise unremarkable.  No significant bony abnormality.  IMPRESSION: The partial small bowel obstruction noted on the current CT is not evident radiographically. No acute findings noted on the current portable supine abdominal radiograph.   Electronically Signed   By: Lajean Manes M.D.   On: 12/02/2014 15:12    Medications / Allergies:  Scheduled Meds: . enoxaparin (LOVENOX) injection  40 mg Subcutaneous Q24H  . nicotine  21 mg Transdermal Daily   Continuous Infusions: . 0.9 % NaCl with KCl 20 mEq / L 125 mL/hr at 12/03/14 0610   PRN Meds:.HYDROmorphone (DILAUDID) injection, ondansetron **OR** ondansetron (ZOFRAN) IV  Antibiotics: Anti-infectives    None        Assessment/Plan Abdominal pain-no obstruction, tolerating POs.  No surgical issues.  Signing off.  Please call with questions or concerns.    Erby Pian, [facility] Pager [phone]) For consults and floor pages call [phone]([id number]:30P)  12/03/2014 7:57 AM

## 2017-03-16 ENCOUNTER — Emergency Department (HOSPITAL_COMMUNITY): Payer: 59

## 2017-03-16 ENCOUNTER — Inpatient Hospital Stay (HOSPITAL_COMMUNITY)
Admission: EM | Admit: 2017-03-16 | Discharge: 2017-03-16 | DRG: 390 | Discharge status: Against Medical Advice | Payer: 59 | Attending: Surgery | Admitting: Surgery

## 2017-03-16 ENCOUNTER — Other Ambulatory Visit: Payer: Self-pay

## 2017-03-16 ENCOUNTER — Inpatient Hospital Stay (HOSPITAL_COMMUNITY): Payer: 59

## 2017-03-16 ENCOUNTER — Encounter (HOSPITAL_COMMUNITY): Payer: Self-pay | Admitting: *Deleted

## 2017-03-16 DIAGNOSIS — Z5321 Procedure and treatment not carried out due to patient leaving prior to being seen by health care provider: Secondary | ICD-10-CM

## 2017-03-16 DIAGNOSIS — K56609 Unspecified intestinal obstruction, unspecified as to partial versus complete obstruction: Secondary | ICD-10-CM | POA: Diagnosis not present

## 2017-03-16 DIAGNOSIS — D72829 Elevated white blood cell count, unspecified: Secondary | ICD-10-CM | POA: Diagnosis present

## 2017-03-16 DIAGNOSIS — E860 Dehydration: Secondary | ICD-10-CM | POA: Diagnosis present

## 2017-03-16 DIAGNOSIS — F17210 Nicotine dependence, cigarettes, uncomplicated: Secondary | ICD-10-CM | POA: Diagnosis present

## 2017-03-16 DIAGNOSIS — Z0189 Encounter for other specified special examinations: Secondary | ICD-10-CM

## 2017-03-16 LAB — CBC
HEMATOCRIT: 44.4 % (ref 39.0–52.0)
Hemoglobin: 15.6 g/dL (ref 13.0–17.0)
MCH: 31 pg (ref 26.0–34.0)
MCHC: 35.1 g/dL (ref 30.0–36.0)
MCV: 88.1 fL (ref 78.0–100.0)
Platelets: 294 10*3/uL (ref 150–400)
RBC: 5.04 MIL/uL (ref 4.22–5.81)
RDW: 13.2 % (ref 11.5–15.5)
WBC: 13.2 10*3/uL — AB (ref 4.0–10.5)

## 2017-03-16 LAB — COMPREHENSIVE METABOLIC PANEL
ALBUMIN: 4.3 g/dL (ref 3.5–5.0)
ALT: 33 U/L (ref 17–63)
AST: 28 U/L (ref 15–41)
Alkaline Phosphatase: 53 U/L (ref 38–126)
Anion gap: 10 (ref 5–15)
BUN: 16 mg/dL (ref 6–20)
CHLORIDE: 105 mmol/L (ref 101–111)
CO2: 22 mmol/L (ref 22–32)
CREATININE: 1.29 mg/dL — AB (ref 0.61–1.24)
Calcium: 9.6 mg/dL (ref 8.9–10.3)
GFR calc Af Amer: >60 mL/min (ref >60)
Glucose, Bld: 124 mg/dL — ABNORMAL HIGH (ref 65–99)
POTASSIUM: 3.7 mmol/L (ref 3.5–5.1)
SODIUM: 137 mmol/L (ref 135–145)
Total Bilirubin: 1.8 mg/dL — ABNORMAL HIGH (ref 0.3–1.2)
Total Protein: 8.2 g/dL — ABNORMAL HIGH (ref 6.5–8.1)

## 2017-03-16 LAB — I-STAT CG4 LACTIC ACID, ED
Lactic Acid, Venous: 0.59 mmol/L (ref 0.5–1.9)
Lactic Acid, Venous: 0.96 mmol/L (ref 0.5–1.9)

## 2017-03-16 LAB — LIPASE, BLOOD: LIPASE: 22 U/L (ref 11–51)

## 2017-03-16 MED ORDER — ONDANSETRON 4 MG PO TBDP: 4.0000 mg | ORAL_TABLET | Freq: Four times a day (QID) | ORAL | Status: DC | PRN

## 2017-03-16 MED ORDER — DIATRIZOATE MEGLUMINE & SODIUM 66-10 % PO SOLN
90.0000 mL | Freq: Once | ORAL | Status: AC
  Administered 2017-03-16: 90 mL via ORAL

## 2017-03-16 MED ORDER — SODIUM CHLORIDE 0.9 % IV BOLUS (SEPSIS)
1000.0000 mL | Freq: Once | INTRAVENOUS | Status: AC
  Administered 2017-03-16: 1000 mL via INTRAVENOUS

## 2017-03-16 MED ORDER — FENTANYL CITRATE (PF) 100 MCG/2ML IJ SOLN
50.0000 ug | INTRAMUSCULAR | Status: AC | PRN
  Administered 2017-03-16 (×2): 50 ug via INTRAVENOUS
  Filled 2017-03-16 (×2): qty 2

## 2017-03-16 MED ORDER — FENTANYL CITRATE (PF) 100 MCG/2ML IJ SOLN: 25.0000 ug | Freq: Once | INTRAMUSCULAR | Status: DC

## 2017-03-16 MED ORDER — PANTOPRAZOLE SODIUM 40 MG IV SOLR: 40.0000 mg | Freq: Every day | INTRAVENOUS | Status: DC

## 2017-03-16 MED ORDER — ONDANSETRON HCL 4 MG/2ML IJ SOLN
4.0000 mg | Freq: Four times a day (QID) | INTRAMUSCULAR | Status: DC | PRN
  Administered 2017-03-16: 4 mg via INTRAVENOUS
  Filled 2017-03-16: qty 2

## 2017-03-16 MED ORDER — ONDANSETRON HCL 4 MG/2ML IJ SOLN
4.0000 mg | Freq: Once | INTRAMUSCULAR | Status: AC
  Administered 2017-03-16: 4 mg via INTRAVENOUS
  Filled 2017-03-16: qty 2

## 2017-03-16 MED ORDER — IOPAMIDOL (ISOVUE-300) INJECTION 61%
INTRAVENOUS | Status: AC
  Administered 2017-03-16: 100 mL
  Filled 2017-03-16: qty 100

## 2017-03-16 MED ORDER — HYDROMORPHONE HCL 1 MG/ML IJ SOLN
1.0000 mg | INTRAMUSCULAR | Status: DC | PRN
  Administered 2017-03-16: 1 mg via INTRAVENOUS
  Filled 2017-03-16: qty 1

## 2017-03-16 MED ORDER — KCL IN DEXTROSE-NACL 20-5-0.45 MEQ/L-%-% IV SOLN
INTRAVENOUS | Status: DC
  Administered 2017-03-16: 12:00:00 via INTRAVENOUS
  Filled 2017-03-16: qty 1000

## 2017-03-16 MED ORDER — HYDRALAZINE HCL 20 MG/ML IJ SOLN: 10.0000 mg | INTRAMUSCULAR | Status: DC | PRN

## 2017-03-16 MED ORDER — DIATRIZOATE MEGLUMINE & SODIUM 66-10 % PO SOLN
90.0000 mL | Freq: Once | ORAL | Status: DC
  Filled 2017-03-16: qty 90

## 2017-03-16 NOTE — Progress Notes (Signed)
Patient reports that he had 3 bowel movement between 1630 hrs and 1715 hrs none witnessed by RN

## 2017-03-16 NOTE — Progress Notes (Signed)
Patient refuses to allow NGT to be advanced per order. Repeatedly requests NGT to be removed. Paged and notified PA by telephone. Received new order to remove NGT.

## 2017-03-16 NOTE — ED Notes (Signed)
ED Provider at bedside. 

## 2017-03-16 NOTE — Progress Notes (Addendum)
Pt left AMA(signed AMA) Pt stated he had 6 BM`s today. "There is no reason to stay overnight" I`ve been doing this since 2011 and I know my body. There is no one who can convince me to stay" On call Chelsea Connor,MD made aware via text page.

## 2017-03-16 NOTE — H&P (Signed)
[facility] Admission Note  [name] RUFORD DUDZINSKI [birth year]  [id number].    Requesting MD: Billie Lade Chief Complaint/Reason for Consult: SBO HPI:  Patient presented to [facility] ED with abdominal pain, n/v since yesterday. Reports generalized pain now, initially felt sharp intermittent pain in mid abdomen. Had BM yesterday but was hard and small, feels like BMs have become harder and smaller over the last few weeks. No flatus since yesterday. 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 He has been able to manage past SBO conservatively. Hx of multiple abdominal surgeries back in 2011. Does not take any sort of bowel regimen, because he states in the past that caused him to have terrible diarrhea. Reports that when this happens he used to take 2.5 mg of oxycodone to "relax intestines" and then he was usually able to have a BM. Reports sweats/chills. Denies chest pain, SOB, blood in stool, urinary symptoms.   ROS: Review of Systems  Constitutional: Positive for chills and fever.  Respiratory: Negative for cough and shortness of breath.   Cardiovascular: Negative for chest pain and palpitations.  Gastrointestinal: Positive for abdominal pain, constipation, nausea and vomiting. Negative for blood in stool.  Genitourinary: Negative for dysuria and urgency.  All other systems reviewed and are negative.   No family history on file.  Past Medical History:  Diagnosis Date  . Diverticulitis     Past Surgical History:  Procedure Laterality Date  . arm surgery    . CHOLECYSTECTOMY    . COLON RESECTION    . ileostomy takedown  2011  . loop ileostomy and repair of anastomotic leak at sigmoid colon  2011    Social History:  reports that he has been smoking.  He does not have any smokeless tobacco history on file. He reports that he does not drink alcohol or use drugs.  Allergies: No Known Allergies   (Not in a hospital admission)  Blood pressure (!) 129/93, pulse (!) 56, temperature  (!) 97.4 F (36.3 C), temperature source Oral, resp. rate 14, height 6' (1.829 m), weight 88.5 kg (195 lb), SpO2 99 %. Physical Exam: Physical Exam  Constitutional: He is oriented to person, place, and time. He appears well-developed and well-nourished. He is cooperative.  Non-toxic appearance. He does not appear ill.  HENT:  Head: Normocephalic and atraumatic.  Right Ear: External ear normal.  Left Ear: External ear normal.  Nose: Nose normal.  Mouth/Throat: Oropharynx is clear and moist and mucous membranes are normal.  Eyes: Conjunctivae, EOM and lids are normal. Pupils are equal, round, and reactive to light. No scleral icterus.  Neck: Normal range of motion and phonation normal. Neck supple.  Cardiovascular: Normal rate and regular rhythm.  Pulses:      Radial pulses are 2+ on the right side, and 2+ on the left side.       Dorsalis pedis pulses are 2+ on the right side, and 2+ on the left side.  Pulmonary/Chest: Effort normal and breath sounds normal.  Abdominal: Soft. He exhibits no distension. Bowel sounds are decreased. There is no hepatosplenomegaly. There is generalized tenderness. There is guarding. There is no rigidity and no rebound. No hernia.  Musculoskeletal:  ROM grossly intact in BL upper and lower extremities.   Neurological: He is alert and oriented to person, place, and time.  Skin: Skin is warm, dry and intact. No rash noted. No pallor.  Psychiatric: He has a normal mood and affect. His speech is normal and behavior is  normal.    Results for orders placed or performed during the hospital encounter of 03/16/17 (from the past 48 hour(s))  Lipase, blood     Status: None   Collection Time: 03/16/17  5:05 AM  Result Value Ref Range   Lipase 22 11 - 51 U/L  Comprehensive metabolic panel     Status: Abnormal   Collection Time: 03/16/17  5:05 AM  Result Value Ref Range   Sodium 137 135 - 145 mmol/L   Potassium 3.7 3.5 - 5.1 mmol/L   Chloride 105 101 - 111 mmol/L    CO2 22 22 - 32 mmol/L   Glucose, Bld 124 (H) 65 - 99 mg/dL   BUN 16 6 - 20 mg/dL   Creatinine, Ser 1.29 (H) 0.61 - 1.24 mg/dL   Calcium 9.6 8.9 - 10.3 mg/dL   Total Protein 8.2 (H) 6.5 - 8.1 g/dL   Albumin 4.3 3.5 - 5.0 g/dL   AST 28 15 - 41 U/L   ALT 33 17 - 63 U/L   Alkaline Phosphatase 53 38 - 126 U/L   Total Bilirubin 1.8 (H) 0.3 - 1.2 mg/dL   GFR calc non Af Amer >60 >60 mL/min   GFR calc Af Amer >60 >60 mL/min    Comment: (NOTE) The eGFR has been calculated using the CKD EPI equation. This calculation has not been validated in all clinical situations. eGFR's persistently <60 mL/min signify possible Chronic Kidney Disease.    Anion gap 10 5 - 15  CBC     Status: Abnormal   Collection Time: 03/16/17  5:05 AM  Result Value Ref Range   WBC 13.2 (H) 4.0 - 10.5 K/uL   RBC 5.04 4.22 - 5.81 MIL/uL   Hemoglobin 15.6 13.0 - 17.0 g/dL   HCT 44.4 39.0 - 52.0 %   MCV 88.1 78.0 - 100.0 fL   MCH 31.0 26.0 - 34.0 pg   MCHC 35.1 30.0 - 36.0 g/dL   RDW 13.2 11.5 - 15.5 %   Platelets 294 150 - 400 K/uL  I-Stat CG4 Lactic Acid, ED     Status: None   Collection Time: 03/16/17  7:05 AM  Result Value Ref Range   Lactic Acid, Venous 0.59 0.5 - 1.9 mmol/L  I-Stat CG4 Lactic Acid, ED     Status: None   Collection Time: 03/16/17 10:00 AM  Result Value Ref Range   Lactic Acid, Venous 0.96 0.5 - 1.9 mmol/L   Dg Abd 1 View  Result Date: 03/16/2017 CLINICAL DATA:  46 year old male status post nasogastric tube placement. EXAM: ABDOMEN - 1 VIEW COMPARISON:  12/02/2014. FINDINGS: Nasogastric tube tip in the mid body of the stomach. Side port just distal to the gastroesophageal junction. Several prominent nondilated loops of gas-filled small bowel are noted in the central and lower abdomen measuring up to 2.8 cm in diameter. In addition, in the left lower quadrant there is a mildly dilated loop of gas-filled small bowel measuring 4 cm. Gas and stool are noted throughout the visualized portions of  colon. Surgical clips project over the right upper quadrant of the abdomen, compatible with prior cholecystectomy. Iodinated contrast material was noted in the collecting systems of both kidneys and ureters bilaterally. IMPRESSION: 1. Nasogastric tube in position, as above. The tube could be advanced approximately 5-8 cm for more optimal placement. 2. Bowel gas pattern suggests either early or partial small bowel obstruction. 3. No pneumoperitoneum. 4. Status post cholecystectomy. Electronically Signed   By: Vinnie Langton  M.D.   On: 03/16/2017 09:59   Ct Abdomen Pelvis W Contrast  Result Date: 03/16/2017 CLINICAL DATA:  Nausea and vomiting EXAM: CT ABDOMEN AND PELVIS WITH CONTRAST TECHNIQUE: Multidetector CT imaging of the abdomen and pelvis was performed using the standard protocol following bolus administration of intravenous contrast. CONTRAST:  168m ISOVUE-300 IOPAMIDOL (ISOVUE-300) INJECTION 61% COMPARISON:  December 02, 2014 FINDINGS: Lower chest: There is bibasilar atelectatic change. No frank edema or consolidation seen in lung bases. There is a small hiatal hernia. Hepatobiliary: There is hepatic steatosis. No focal liver lesions are evident. Gallbladder is absent. There is no appreciable biliary duct dilatation. Pancreas: There is no pancreatic mass or inflammatory focus. Spleen: No splenic lesions are evident. Adrenals/Urinary Tract: Adrenals appear normal bilaterally. Kidneys bilaterally show no evident mass or hydronephrosis on either side. There is no renal or ureteral calculus on either side. Urinary bladder is midline with wall thickness within normal limits. Stomach/Bowel: There is postoperative change in the right ileal region. There is mild generalized small bowel dilatation throughout the small bowel to the level of the terminal ileum. At the terminal ileum, there is a more normal contour. Question ileus versus an early partial bowel obstruction at the level of the terminal ileum. There  is no fistula. Note that most bowel loops contain fluid. There are scattered areas of mild small bowel wall thickening in the mid abdomen region. No evident free air or portal venous air. There is no appreciable colonic dilatation. No colonic wall thickening evident. Vascular/Lymphatic: There is no abdominal aortic aneurysm. Major mesenteric vessels appear patent. No adenopathy is appreciable in the abdomen or pelvis. Reproductive: Prostate and seminal vesicles appear normal in size and contour. No pelvic mass evident. Other: Appendix appears normal. There is no abscess or ascites in the abdomen or pelvis. There is mild fat in each inguinal ring. No bowel is seen in either inguinal ring area. Musculoskeletal: No blastic or lytic bone lesions are evident. There is no intramuscular or bowel lesion evident. IMPRESSION: 1. There is mild bowel dilatation of most of the small bowel loops with several areas of mild bowel wall thickening. There is a questionable transition zone near the terminal ileum. Question ileus versus distal partial bowel obstruction with transition zone near the terminal ileum. No evident free air or portal venous air. There are areas of postoperative change with patent appearing anastomoses. 2.  No abscess in the abdomen or pelvis.  Appendix appears normal. 3.  No renal or ureteral calculus.  No hydronephrosis. 4.  Absent gallbladder.  Hepatic steatosis. Electronically Signed   By: WLowella GripIII M.D.   On: 03/16/2017 08:05      Assessment/Plan SBO - CT: mild bowel dilatation of most of the small bowel loops with several areas of mild bowel wall thickening. There is a questionable transition zone near the terminal ileum. Question ileus versus distal partial bowel obstruction with transition zone near the terminal ileum. No evident free air - WBC 13.1, afebrile - likely related to adhesions from past surgeries - admit - SB protocol  [facility] 03/16/2017, 10:07 AM Pager: [phone]: [phone]-Fri 7:00 am-4:30 pm Sat-Sun 7:00 am-11:30 am

## 2017-03-16 NOTE — ED Notes (Signed)
Attempted report 

## 2017-03-16 NOTE — Progress Notes (Signed)
Patient's fiancee, "Raynelle FanningJulie" states she is leaving to go to work. States patient has history of leaving hospital before discharge. States "if he tries to leave, just let him go, he's done it before. He thinks he has all the answers." Raynelle FanningJulie left her telephone number if any issues/problems arise.

## 2017-03-16 NOTE — ED Triage Notes (Signed)
Pt c/o severe abd pain since yesterday. Reports being told by PCP that he has another partial bowel obstruction. Pt has had 5 abd surgeries with colon resection

## 2017-03-16 NOTE — ED Notes (Signed)
Patient transported to X-ray 

## 2017-03-16 NOTE — ED Provider Notes (Signed)
[name]   CSN: [id number] Arrival date & time: 03/16/17  0458     History   Chief Complaint Chief Complaint  Patient presents with  . Abdominal Pain    HPI [name] Clearance [name] is a 46 y.o. male with history significant for small bowel obstruction status post colectomy, colostomy and colostomy reversal, cholecystectomy (Dr. Lindie SpruceWyatt) presents to ED for evaluation of nausea, vomiting, chills, severe abdominal pain since yesterday. Abdominal pain described as waxing and waning, sharp and localized to the epigastrium and diffuse lower abdominal area. Has had changes in bowel movements over the last 2 weeks,smaller, harder stools. He is followed closely by Dr. Loreta AveMann with gastroenterology who has told him is his small bowel obstruction type abdominal pain/attack returns to go to the emergency room. He has a right in order for CT abdomen/pelvis with contrast from Dr. Loreta AveMann. Last bowel movement yesterday, has not passed gas since yesterday. Last oral intake 7:30 PM yesterday.  Denies fevers, chest pain, shortness of breath, abdominal distention, dysuria, hematuria, melena or hematochezia. Narcotic pain medicines stopped many years ago.  HPI  Past Medical History:  Diagnosis Date  . Diverticulitis     Patient Active Problem List   Diagnosis Date Noted  . Bowel obstruction (HCC) 12/02/2014  . SBO (small bowel obstruction) (HCC) 12/02/2014  . Hypokalemia 12/02/2014    Past Surgical History:  Procedure Laterality Date  . arm surgery    . CHOLECYSTECTOMY    . COLON RESECTION    . ileostomy takedown  2011  . loop ileostomy and repair of anastomotic leak at sigmoid colon  2011       Home Medications    Prior to Admission medications   Not on File    Family History No family history on file.  Social History Social History   Tobacco Use  . Smoking status: Current Every Day Smoker  Substance Use Topics  . Alcohol use: No  .  Drug use: No     Allergies   Patient has no known allergies.   Review of Systems Review of Systems  Constitutional: Positive for appetite change and chills.  Gastrointestinal: Positive for abdominal pain, nausea and vomiting.       +changes in BM  All other systems reviewed and are negative.    Physical Exam Updated Vital Signs BP 134/89   Pulse (!) 54   Temp (!) 97.4 F (36.3 C) (Oral)   Resp 14   Ht 6' (1.829 m)   Wt 88.5 kg (195 lb)   SpO2 99%   BMI 26.45 kg/m   Physical Exam  Constitutional: He is oriented to person, place, and time. He appears well-developed and well-nourished. No distress.  NAD. Non toxic.   HENT:  Head: Normocephalic and atraumatic.  Right Ear: External ear normal.  Left Ear: External ear normal.  Nose: Nose normal.  Moist mucous membranes.  Eyes: Conjunctivae and EOM are normal. No scleral icterus.  Neck: Normal range of motion. Neck supple.  Cardiovascular: Normal rate, regular rhythm, normal heart sounds and intact distal pulses.  No murmur heard. Pulmonary/Chest: Effort normal and breath sounds normal. He has no wheezes.  Abdominal: Soft. Normal appearance and bowel sounds are normal. There is tenderness in the epigastric area, left upper quadrant and left lower quadrant.  Focal tenderness to epigastrium. Lower abdomen is tender, most significant suprapubic/left lower quadrant. No guarding, rigidity, rebound. Multiple well-healed surgical scars in the abdomen. Negative Murphy's and McBurney's.  No CVA tenderness.  Musculoskeletal: Normal range of motion. He exhibits no deformity.  Neurological: He is alert and oriented to person, place, and time.  Skin: Skin is warm and dry. Capillary refill takes less than 2 seconds.  Psychiatric: He has a normal mood and affect. His behavior is normal. Judgment and thought content normal.  Nursing note and vitals reviewed.    ED Treatments / Results  Labs (all labs ordered are listed, but only  abnormal results are displayed) Labs Reviewed  COMPREHENSIVE METABOLIC PANEL - Abnormal; Notable for the following components:      Result Value   Glucose, Bld 124 (*)    Creatinine, Ser 1.29 (*)    Total Protein 8.2 (*)    Total Bilirubin 1.8 (*)    All other components within normal limits  CBC - Abnormal; Notable for the following components:   WBC 13.2 (*)    All other components within normal limits  LIPASE, BLOOD  URINALYSIS, ROUTINE W REFLEX MICROSCOPIC  HIV ANTIBODY (ROUTINE TESTING)  I-STAT CG4 LACTIC ACID, ED  I-STAT CG4 LACTIC ACID, ED    EKG  EKG Interpretation None       Radiology Dg Abd 1 View  Result Date: 03/16/2017 CLINICAL DATA:  46 year old male status post nasogastric tube placement. EXAM: ABDOMEN - 1 VIEW COMPARISON:  12/02/2014. FINDINGS: Nasogastric tube tip in the mid body of the stomach. Side port just distal to the gastroesophageal junction. Several prominent nondilated loops of gas-filled small bowel are noted in the central and lower abdomen measuring up to 2.8 cm in diameter. In addition, in the left lower quadrant there is a mildly dilated loop of gas-filled small bowel measuring 4 cm. Gas and stool are noted throughout the visualized portions of colon. Surgical clips project over the right upper quadrant of the abdomen, compatible with prior cholecystectomy. Iodinated contrast material was noted in the collecting systems of both kidneys and ureters bilaterally. IMPRESSION: 1. Nasogastric tube in position, as above. The tube could be advanced approximately 5-8 cm for more optimal placement. 2. Bowel gas pattern suggests either early or partial small bowel obstruction. 3. No pneumoperitoneum. 4. Status post cholecystectomy. Electronically Signed   By: Trudie Reed M.D.   On: 03/16/2017 09:59   Ct Abdomen Pelvis W Contrast  Result Date: 03/16/2017 CLINICAL DATA:  Nausea and vomiting EXAM: CT ABDOMEN AND PELVIS WITH CONTRAST TECHNIQUE: Multidetector  CT imaging of the abdomen and pelvis was performed using the standard protocol following bolus administration of intravenous contrast. CONTRAST:  ISOVUE-300 IOPAMIDOL (ISOVUE-300) INJECTION 61% COMPARISON:  December 02, 2014 FINDINGS: Lower chest: There is bibasilar atelectatic change. No frank edema or consolidation seen in lung bases. There is a small hiatal hernia. Hepatobiliary: There is hepatic steatosis. No focal liver lesions are evident. Gallbladder is absent. There is no appreciable biliary duct dilatation. Pancreas: There is no pancreatic mass or inflammatory focus. Spleen: No splenic lesions are evident. Adrenals/Urinary Tract: Adrenals appear normal bilaterally. Kidneys bilaterally show no evident mass or hydronephrosis on either side. There is no renal or ureteral calculus on either side. Urinary bladder is midline with wall thickness within normal limits. Stomach/Bowel: There is postoperative change in the right ileal region. There is mild generalized small bowel dilatation throughout the small bowel to the level of the terminal ileum. At the terminal ileum, there is a more normal contour. Question ileus versus an early partial bowel obstruction at the level of the terminal ileum. There is no fistula. Note that  most bowel loops contain fluid. There are scattered areas of mild small bowel wall thickening in the mid abdomen region. No evident free air or portal venous air. There is no appreciable colonic dilatation. No colonic wall thickening evident. Vascular/Lymphatic: There is no abdominal aortic aneurysm. Major mesenteric vessels appear patent. No adenopathy is appreciable in the abdomen or pelvis. Reproductive: Prostate and seminal vesicles appear normal in size and contour. No pelvic mass evident. Other: Appendix appears normal. There is no abscess or ascites in the abdomen or pelvis. There is mild fat in each inguinal ring. No bowel is seen in either inguinal ring area. Musculoskeletal: No  blastic or lytic bone lesions are evident. There is no intramuscular or bowel lesion evident. IMPRESSION: 1. There is mild bowel dilatation of most of the small bowel loops with several areas of mild bowel wall thickening. There is a questionable transition zone near the terminal ileum. Question ileus versus distal partial bowel obstruction with transition zone near the terminal ileum. No evident free air or portal venous air. There are areas of postoperative change with patent appearing anastomoses. 2.  No abscess in the abdomen or pelvis.  Appendix appears normal. 3.  No renal or ureteral calculus.  No hydronephrosis. 4.  Absent gallbladder.  Hepatic steatosis. Electronically Signed   By: Bretta BangWilliam  Woodruff III M.D.   On: 03/16/2017 08:05    Procedures Procedures (including critical care time)  Medications Ordered in ED Medications  fentaNYL (SUBLIMAZE) injection 25 mcg (25 mcg Intravenous Not Given 03/16/17 1024)  dextrose 5 % and 0.45 % NaCl with KCl 20 mEq/L infusion (not administered)  ondansetron (ZOFRAN-ODT) disintegrating tablet 4 mg (not administered)    Or  ondansetron (ZOFRAN) injection 4 mg (not administered)  HYDROmorphone (DILAUDID) injection 1 mg (not administered)  pantoprazole (PROTONIX) injection 40 mg (not administered)  hydrALAZINE (APRESOLINE) injection 10 mg (not administered)  diatrizoate meglumine-sodium (GASTROGRAFIN) 66-10 % solution 90 mL (not administered)  fentaNYL (SUBLIMAZE) injection 50 mcg (50 mcg Intravenous Given 03/16/17 0801)  ondansetron (ZOFRAN) injection 4 mg (4 mg Intravenous Given 03/16/17 0546)  sodium chloride 0.9 % bolus 1,000 mL (0 mLs Intravenous Stopped 03/16/17 0714)  iopamidol (ISOVUE-300) 61 % injection (100 mLs  Contrast Given 03/16/17 0719)  ondansetron (ZOFRAN) injection 4 mg (4 mg Intravenous Given 03/16/17 0908)     Initial Impression / Assessment and Plan / ED Course  I have reviewed the triage vital signs and the nursing  notes.  Pertinent labs & imaging results that were available during my care of the patient were reviewed by me and considered in my medical decision making (see chart for details).  Clinical Course as of Mar 16 1050  Wed Mar 16, 2017  0646 WBC: (!) 13.2 [CG]  0646 Creatinine: (!) 1.29 [CG]  0810 IMPRESSION: 1. There is mild bowel dilatation of most of the small bowel loops with several areas of mild bowel wall thickening. There is a questionable transition zone near the terminal ileum. Question ileus versus distal partial bowel obstruction with transition zone near the terminal ileum. No evident free air or portal venous air. There are areas of postoperative change with patent appearing anastomoses. 2. No abscess in the abdomen or pelvis. Appendix appears normal. 3. No renal or ureteral calculus. No hydronephrosis. 4. Absent gallbladder. Hepatic steatosis.  CT Abdomen Pelvis W Contrast [CG]  0900 Spoke to BrentKelly with general surgery who recommends NGT and will see pt in ED. Pt agreeable but states he has never been able  to tolerate NGT. Attempting NGT now  [CG]    Clinical Course User Index [CG] Liberty Handy, PA-C   46 year old male with extensive past surgical history for small bowel obstruction presents for acute onset abdominal pain, nausea, vomiting and chills. Previous surgeries by Dr. Lindie Spruce.  On exam he has focal tenderness to epigastrium and diffuse lower abdomen, no guarding, rigidity or rebound. He is nontoxic appearing. Afebrile.  Lab work shows mild leukocytosis and only slightly elevated creatinine, likely dehydration.CT with mild bowel dilation and most of the small bowel with several areas of bowel wall thickening, questionable transitional zone near the terminal ileum.  Final Clinical Impressions(s) / ED Diagnoses   Spoke to [place] with Gen. Surgery who evaluated patient in the emergency department, recommending NG tube placement and admission. Discussed plan with  patient was agreeable. Nausea and abdominal pain adequately controlled in the ED. Final diagnoses:  Encounter for imaging study to confirm nasogastric (NG) tube placement  Small bowel obstruction [facility])    ED Discharge Orders    None       Liberty Handy, PA-C 03/16/17 1051    Zadie Rhine, MD 03/16/17 2306

## 2017-03-16 NOTE — ED Notes (Signed)
Patient transported to CT 

## 2017-03-17 LAB — HIV ANTIBODY (ROUTINE TESTING W REFLEX): HIV Screen 4th Generation wRfx: NONREACTIVE

## 2017-09-12 ENCOUNTER — Telehealth: Payer: Self-pay | Admitting: Gastroenterology

## 2017-09-12 NOTE — Telephone Encounter (Signed)
Records will be put to the attn of DOD 09/12/17 Dr.Jacobs to review.

## 2017-09-13 NOTE — Telephone Encounter (Signed)
Dr. Christella HartiganJacobs reviewed records and has declined to accept patient. Dr. Christella HartiganJacobs states "that patient is getting good care from Dr. Loreta AveMann" and that "he would have nothing else to add"

## 2019-01-12 ENCOUNTER — Encounter: Payer: Self-pay | Admitting: Emergency Medicine

## 2019-01-12 ENCOUNTER — Other Ambulatory Visit: Payer: Self-pay

## 2019-01-12 ENCOUNTER — Emergency Department
Admission: EM | Admit: 2019-01-12 | Discharge: 2019-01-12 | Disposition: A | Discharge status: Home / Self Care | Payer: 59 | Attending: Emergency Medicine | Admitting: Emergency Medicine

## 2019-01-12 ENCOUNTER — Emergency Department: Payer: 59

## 2019-01-12 DIAGNOSIS — R109 Unspecified abdominal pain: Secondary | ICD-10-CM | POA: Diagnosis present

## 2019-01-12 DIAGNOSIS — K529 Noninfective gastroenteritis and colitis, unspecified: Secondary | ICD-10-CM | POA: Diagnosis not present

## 2019-01-12 DIAGNOSIS — Z5321 Procedure and treatment not carried out due to patient leaving prior to being seen by health care provider: Secondary | ICD-10-CM | POA: Insufficient documentation

## 2019-01-12 DIAGNOSIS — F17200 Nicotine dependence, unspecified, uncomplicated: Secondary | ICD-10-CM | POA: Insufficient documentation

## 2019-01-12 DIAGNOSIS — K644 Residual hemorrhoidal skin tags: Secondary | ICD-10-CM | POA: Diagnosis not present

## 2019-01-12 DIAGNOSIS — R1084 Generalized abdominal pain: Secondary | ICD-10-CM | POA: Diagnosis not present

## 2019-01-12 DIAGNOSIS — K649 Unspecified hemorrhoids: Secondary | ICD-10-CM

## 2019-01-12 DIAGNOSIS — Z20828 Contact with and (suspected) exposure to other viral communicable diseases: Secondary | ICD-10-CM | POA: Insufficient documentation

## 2019-01-12 HISTORY — DX: Unspecified intestinal obstruction, unspecified as to partial versus complete obstruction: K56.609

## 2019-01-12 LAB — TYPE AND SCREEN
ABO/RH(D): O POS
Antibody Screen: NEGATIVE

## 2019-01-12 LAB — CBC WITH DIFFERENTIAL/PLATELET
Abs Immature Granulocytes: 0.04 10*3/uL (ref 0.00–0.07)
Basophils Absolute: 0 10*3/uL (ref 0.0–0.1)
Basophils Relative: 0 %
Eosinophils Absolute: 0.2 10*3/uL (ref 0.0–0.5)
Eosinophils Relative: 2 %
HCT: 38.4 % — ABNORMAL LOW (ref 39.0–52.0)
Hemoglobin: 12.6 g/dL — ABNORMAL LOW (ref 13.0–17.0)
Immature Granulocytes: 0 %
Lymphocytes Relative: 13 %
Lymphs Abs: 1.6 10*3/uL (ref 0.7–4.0)
MCH: 28.3 pg (ref 26.0–34.0)
MCHC: 32.8 g/dL (ref 30.0–36.0)
MCV: 86.3 fL (ref 80.0–100.0)
Monocytes Absolute: 1.3 10*3/uL — ABNORMAL HIGH (ref 0.1–1.0)
Monocytes Relative: 11 %
Neutro Abs: 8.9 10*3/uL — ABNORMAL HIGH (ref 1.7–7.7)
Neutrophils Relative %: 74 %
Platelets: 355 10*3/uL (ref 150–400)
RBC: 4.45 MIL/uL (ref 4.22–5.81)
RDW: 12.2 % (ref 11.5–15.5)
WBC: 12.1 10*3/uL — ABNORMAL HIGH (ref 4.0–10.5)
nRBC: 0 % (ref 0.0–0.2)

## 2019-01-12 LAB — COMPREHENSIVE METABOLIC PANEL
ALT: 17 U/L (ref 0–44)
AST: 20 U/L (ref 15–41)
Albumin: 3.9 g/dL (ref 3.5–5.0)
Alkaline Phosphatase: 46 U/L (ref 38–126)
Anion gap: 9 (ref 5–15)
BUN: 18 mg/dL (ref 6–20)
CO2: 23 mmol/L (ref 22–32)
Calcium: 9 mg/dL (ref 8.9–10.3)
Chloride: 106 mmol/L (ref 98–111)
Creatinine, Ser: 1.08 mg/dL (ref 0.61–1.24)
GFR calc Af Amer: >60 mL/min (ref >60)
GFR calc non Af Amer: >60 mL/min (ref >60)
Glucose, Bld: 118 mg/dL — ABNORMAL HIGH (ref 70–99)
Potassium: 3.6 mmol/L (ref 3.5–5.1)
Sodium: 138 mmol/L (ref 135–145)
Total Bilirubin: 1.8 mg/dL — ABNORMAL HIGH (ref 0.3–1.2)
Total Protein: 7.4 g/dL (ref 6.5–8.1)

## 2019-01-12 LAB — PROTIME-INR
INR: 1 (ref 0.8–1.2)
Prothrombin Time: 13 seconds (ref 11.4–15.2)

## 2019-01-12 LAB — LIPASE, BLOOD: Lipase: 21 U/L (ref 11–51)

## 2019-01-12 LAB — SARS CORONAVIRUS 2 BY RT PCR (HOSPITAL ORDER, PERFORMED IN ~~LOC~~ HOSPITAL LAB): SARS Coronavirus 2: NEGATIVE

## 2019-01-12 MED ORDER — IOHEXOL 9 MG/ML PO SOLN: 500.0000 mL | Freq: Once | ORAL | Status: DC | PRN

## 2019-01-12 MED ORDER — ONDANSETRON HCL 4 MG/2ML IJ SOLN
4.0000 mg | Freq: Once | INTRAMUSCULAR | Status: AC
  Administered 2019-01-12: 4 mg via INTRAVENOUS
  Filled 2019-01-12: qty 2

## 2019-01-12 MED ORDER — IOHEXOL 300 MG/ML  SOLN
100.0000 mL | Freq: Once | INTRAMUSCULAR | Status: AC | PRN
  Administered 2019-01-12: 100 mL via INTRAVENOUS

## 2019-01-12 MED ORDER — CIPROFLOXACIN HCL 500 MG PO TABS: 500.0000 mg | ORAL_TABLET | Freq: Two times a day (BID) | ORAL | 0 refills | Status: DC

## 2019-01-12 MED ORDER — LIDOCAINE HCL URETHRAL/MUCOSAL 2 % EX GEL
1.0000 "application " | Freq: Once | CUTANEOUS | Status: AC
  Administered 2019-01-12: 1 via TOPICAL
  Filled 2019-01-12: qty 10

## 2019-01-12 MED ORDER — HYDROMORPHONE HCL 1 MG/ML IJ SOLN
1.0000 mg | Freq: Once | INTRAMUSCULAR | Status: AC
  Administered 2019-01-12: 1 mg via INTRAVENOUS
  Filled 2019-01-12: qty 1

## 2019-01-12 MED ORDER — LACTULOSE 10 GM/15ML PO SOLN: 20.0000 g | Freq: Every day | ORAL | 0 refills | Status: DC | PRN

## 2019-01-12 MED ORDER — SODIUM CHLORIDE 0.9 % IV BOLUS
1000.0000 mL | Freq: Once | INTRAVENOUS | Status: AC
  Administered 2019-01-12: 1000 mL via INTRAVENOUS

## 2019-01-12 MED ORDER — DICYCLOMINE HCL 20 MG PO TABS: 20.0000 mg | ORAL_TABLET | Freq: Four times a day (QID) | ORAL | 0 refills | Status: DC | PRN

## 2019-01-12 MED ORDER — HYDROCORT-PRAMOXINE (PERIANAL) 1-1 % EX FOAM: 1.0000 | Freq: Three times a day (TID) | CUTANEOUS | 0 refills | Status: DC

## 2019-01-12 NOTE — ED Provider Notes (Signed)
[facility] Emergency Department Provider Note   ____________________________________________   First MD Initiated Contact with Patient 01/12/19 0315     (approximate)  I have reviewed the triage vital signs and the nursing notes.   HISTORY  Chief Complaint Abdominal Pain    HPI [name] is a 48 y.o. male who presents to the ED from home with a chief complaint of abdominal pain, rectal bleeding and hemorrhoids.  Patient has a history of diverticulitis status post multiple abdominal surgeries last in 2011 including ex lap/resection with anastomotic leak from his colorectal connection which required return to the OR with diverting loop ileostomy which was ultimately reversed.  States he has "spells" every couple of months of crampy abdominal pain, nausea and vomiting which is relieved by taking oxycodone "to help his bowels relax", laying on his right side on the floor of his home, "passing out" and waking up to have diarrhea and the symptoms to subside within a day.  States he has had such an episode yesterday.  Concerned with bloody stools.  Reports bright red blood per rectum with each loose bowel movements.  Complains of hemorrhoidal pain.  Denies fever, cough, chest pain, shortness of breath, dysuria.       Past Medical History:  Diagnosis Date   Diverticulitis    Small bowel obstruction [facility](HCC)     Patient Active Problem List   Diagnosis Date Noted   Bowel obstruction (HCC) 12/02/2014   SBO (small bowel obstruction) (HCC) 12/02/2014   Hypokalemia 12/02/2014    Past Surgical History:  Procedure Laterality Date   arm surgery     CHOLECYSTECTOMY     COLON RESECTION     ileostomy takedown  2011   loop ileostomy and repair of anastomotic leak at sigmoid colon  2011    Prior to Admission medications   Not on File    Allergies Patient has no known allergies.  No family history on file.  Social History Social History   Tobacco  Use   Smoking status: Smoker, Current Status Unknown   Smokeless tobacco: Never Used  Substance Use Topics   Alcohol use: No   Drug use: No    Review of Systems  Constitutional: No fever/chills Eyes: No visual changes. ENT: No sore throat. Cardiovascular: Denies chest pain. Respiratory: Denies shortness of breath. Gastrointestinal: Positive for abdominal pain, nausea and vomiting.  No diarrhea.  No constipation.  Positive for bloody stools and hemorrhoid pain. Genitourinary: Negative for dysuria. Musculoskeletal: Negative for back pain. Skin: Negative for rash. Neurological: Negative for headaches, focal weakness or numbness.   ____________________________________________   PHYSICAL EXAM:  VITAL SIGNS: ED Triage Vitals  Enc Vitals Group     BP 01/12/19 0311 (!) 150/82     Pulse Rate 01/12/19 0311 99     Resp 01/12/19 0311 18     Temp 01/12/19 0311 97.7 F (36.5 C)     Temp Source 01/12/19 0311 Oral     SpO2 01/12/19 0311 98 %     Weight 01/12/19 0313 190 lb (86.2 kg)     Height 01/12/19 0313 6' (1.829 m)     Head Circumference --      Peak Flow --      Pain Score 01/12/19 0312 10     Pain Loc --      Pain Edu? --      Excl. in GC? --     Constitutional: Alert and oriented. Well appearing and in  mild to moderate acute distress. Eyes: Conjunctivae are normal. PERRL. EOMI. Head: Atraumatic. Nose: No congestion/rhinnorhea. Mouth/Throat: Mucous membranes are moist.  Oropharynx non-erythematous. Neck: No stridor.   Cardiovascular: Normal rate, regular rhythm. Grossly normal heart sounds.  Good peripheral circulation. Respiratory: Normal respiratory effort.  No retractions. Lungs CTAB. Gastrointestinal: Soft with mild diffuse tenderness to palpation without rebound or guarding. No distention. No abdominal bruits. No CVA tenderness. Musculoskeletal: No lower extremity tenderness nor edema.  No joint effusions. Neurologic:  Normal speech and language. No gross  focal neurologic deficits are appreciated. No gait instability. Skin:  Skin is warm, dry and intact. No rash noted. Psychiatric: Mood and affect are normal. Speech and behavior are normal.  ____________________________________________   LABS (all labs ordered are listed, but only abnormal results are displayed)  Labs Reviewed  CBC WITH DIFFERENTIAL/PLATELET - Abnormal; Notable for the following components:      Result Value   WBC 12.1 (*)    Hemoglobin 12.6 (*)    HCT 38.4 (*)    Neutro Abs 8.9 (*)    Monocytes Absolute 1.3 (*)    All other components within normal limits  COMPREHENSIVE METABOLIC PANEL - Abnormal; Notable for the following components:   Glucose, Bld 118 (*)    Total Bilirubin 1.8 (*)    All other components within normal limits  SARS CORONAVIRUS 2 BY RT PCR (HOSPITAL ORDER, [place] [facility])  LIPASE, BLOOD  PROTIME-INR  TYPE AND SCREEN   ____________________________________________  EKG  None ____________________________________________  RADIOLOGY  ED MD interpretation: Inflammatory/infectious ileitis; no SBO  Official radiology report(s): Ct Abdomen Pelvis W Contrast  Result Date: 01/12/2019 CLINICAL DATA:  Abdominal pain. History of small-bowel obstruction and prior surgeries. EXAM: CT ABDOMEN AND PELVIS WITH CONTRAST TECHNIQUE: Multidetector CT imaging of the abdomen and pelvis was performed using the standard protocol following bolus administration of intravenous contrast. CONTRAST:  155mL OMNIPAQUE IOHEXOL 300 MG/ML  SOLN COMPARISON:  CT scan 03/16/2017 FINDINGS: Lower chest: The lung bases are clear of acute process. No pleural effusion or pulmonary lesions. The heart is normal in size. No pericardial effusion. The distal esophagus and aorta are unremarkable. Hepatobiliary: No focal hepatic lesions or intrahepatic biliary dilatation. The gallbladder is surgically absent. No intra or extrahepatic biliary dilatation. The portal and  hepatic veins are patent. Pancreas: No mass, inflammation or ductal dilatation. Spleen: Normal size.  No focal lesions. Adrenals/Urinary Tract: Adrenal glands and kidneys are unremarkable. No renal lesions or hydronephrosis. The bladder appears normal. Stomach/Bowel: The stomach is unremarkable. It is well distended. No mass or inflammation. The duodenum is normal. The proximal and mid small bowel loops are unremarkable. Normal mucosal fold pattern and no distension. Extensive surgical changes noted in the right abdomen from previous bowel resections and reanastomosis. There are some fluid-filled loops of small bowel in the right lower quadrant and in the pelvis there are inflamed appearing small bowel loops with wall thickening, mucosal thickening and mild submucosal edema. Findings suggest an inflammatory infectious ileitis. There could be a mild functional obstruction but no mechanical obstruction is identified. The terminal ileum appears normal. A follow-up KUB may be helpful to make sure or that contrast gets all the way into the colon. The colon is unremarkable. No colonic inflammatory process, obstructive findings or mass. Vascular/Lymphatic: The aorta is normal in caliber. No dissection. The branch vessels are patent. The major venous structures are patent. No mesenteric or retroperitoneal mass or adenopathy. Small scattered lymph nodes are noted.  Reproductive: The prostate gland and seminal vesicles are unremarkable. Other: No pelvic mass or free pelvic fluid collections. No inguinal mass or adenopathy. Musculoskeletal: No significant bony findings. IMPRESSION: 1. CT findings suggest mid distal inflammatory or infectious ileitis. There may be a mild functional obstruction but no definite mechanical obstruction. Follow-up KUB may be helpful to establish contrast getting all the way to the colon. 2. Evidence of previous bowel surgery but no abdominal/pelvic mass or adenopathy. 3. Status post cholecystectomy.   No biliary dilatation Electronically Signed   By: Rudie Meyer M.D.   On: 01/12/2019 05:55    ____________________________________________   PROCEDURES  Procedure(s) performed (including Critical Care):  Procedures  Rectal exam: External exam reveals 2 medium sized external hemorrhoids which are nonbleeding and nonthrombosed.  Patient declines DRE. ____________________________________________   INITIAL IMPRESSION / ASSESSMENT AND PLAN / ED COURSE  As part of my medical decision making, I reviewed the following data within the electronic MEDICAL RECORD NUMBER Nursing notes reviewed and incorporated, Labs reviewed, Old chart reviewed, Radiograph reviewed, Notes from prior ED visits and [place] Controlled Substance Database     [name] was evaluated in Emergency Department on 01/12/2019 for the symptoms described in the history of present illness. He was evaluated in the context of the global COVID-19 pandemic, which necessitated consideration that the patient might be at risk for infection with the SARS-CoV-2 virus that causes COVID-19. Institutional protocols and algorithms that pertain to the evaluation of patients at risk for COVID-19 are in a state of rapid change based on information released by regulatory bodies including the CDC and federal and state organizations. These policies and algorithms were followed during the patient's care in the ED.    48 year old male with complex abdominal surgical history secondary to diverticulitis who presents with abdominal pain, nausea/vomiting and hemorrhoids. Differential diagnosis includes, but is not limited to, acute appendicitis, renal colic, testicular torsion, urinary tract infection/pyelonephritis, prostatitis,  epididymitis, diverticulitis, small bowel obstruction or ileus, colitis, abdominal aortic aneurysm, gastroenteritis, hernia, etc.  I personally reviewed patient's old records and see that he was last seen at [facility] in 2018.   States this was his last ED visit and admission for SBO.  He tells me he was frustrated with his gastroenterologist is Ginette Otto and is interested in finding a new one.  Would prefer to have his care in [place].  Will obtain basic labs, rapid Covid in the event patient requires urgent procedure, proceed with CT abdomen/pelvis to evaluate for SBO.  1 mg IV Dilaudid for pain paired with 4 mg IV Zofran for nausea.  Lidocaine jelly ordered for topical application of hemorrhoids.   Clinical Course as of Jan 12 640  Fri Jan 12, 2019  2878 Patient feeling better.  Updated him on all test results.  Will discharge home on Lactulose, Bentyl, Cipro and Proctofoam.  Refer to GI and surgery for outpatient follow-up.  Strict return precautions given.  Patient verbalizes understanding and agrees with plan of care.   [JS]    Clinical Course User Index [JS] Irean Hong, MD     ____________________________________________   FINAL CLINICAL IMPRESSION(S) / ED DIAGNOSES  Final diagnoses:  Generalized abdominal pain  Hemorrhoids, unspecified hemorrhoid type  Ileitis     ED Discharge Orders    None       Note:  This document was prepared using Dragon voice recognition software and may include unintentional dictation errors.   Irean Hong, MD 01/12/19 [phone]

## 2019-01-12 NOTE — ED Triage Notes (Signed)
Pt to triage via w/c, appears uncomfortable, grimacing; st hx SBO, ileostomy with reversal; onset abd pain with rectal bleeding and st has "2 golf-sized hemorroids"

## 2019-01-12 NOTE — Discharge Instructions (Signed)
1.  Take antibiotic as prescribed (Cipro 500 mg twice daily x7 days). 2.  Use Proctofoam on your hemorrhoids 3 times daily as needed. 3.  Take Lactulose as needed for bowel movements.  You may take Bentyl as needed for abdominal cramps. 4.  Return to the ER for worsening symptoms, persistent vomiting, fever, difficulty breathing or other concerns.

## 2019-01-13 ENCOUNTER — Other Ambulatory Visit: Payer: Self-pay

## 2019-01-13 ENCOUNTER — Emergency Department
Admission: EM | Admit: 2019-01-13 | Discharge: 2019-01-13 | Disposition: A | Discharge status: Home / Self Care | Payer: 59 | Source: Home / Self Care

## 2019-01-13 LAB — CBC WITH DIFFERENTIAL/PLATELET
Abs Immature Granulocytes: 0.03 10*3/uL (ref 0.00–0.07)
Basophils Absolute: 0 10*3/uL (ref 0.0–0.1)
Basophils Relative: 0 %
Eosinophils Absolute: 0.3 10*3/uL (ref 0.0–0.5)
Eosinophils Relative: 3 %
HCT: 38.2 % — ABNORMAL LOW (ref 39.0–52.0)
Hemoglobin: 12.4 g/dL — ABNORMAL LOW (ref 13.0–17.0)
Immature Granulocytes: 0 %
Lymphocytes Relative: 18 %
Lymphs Abs: 1.9 10*3/uL (ref 0.7–4.0)
MCH: 28.1 pg (ref 26.0–34.0)
MCHC: 32.5 g/dL (ref 30.0–36.0)
MCV: 86.4 fL (ref 80.0–100.0)
Monocytes Absolute: 1.5 10*3/uL — ABNORMAL HIGH (ref 0.1–1.0)
Monocytes Relative: 13 %
Neutro Abs: 7.3 10*3/uL (ref 1.7–7.7)
Neutrophils Relative %: 66 %
Platelets: 354 10*3/uL (ref 150–400)
RBC: 4.42 MIL/uL (ref 4.22–5.81)
RDW: 12.3 % (ref 11.5–15.5)
WBC: 11 10*3/uL — ABNORMAL HIGH (ref 4.0–10.5)
nRBC: 0 % (ref 0.0–0.2)

## 2019-01-13 LAB — BASIC METABOLIC PANEL
Anion gap: 8 (ref 5–15)
BUN: 12 mg/dL (ref 6–20)
CO2: 23 mmol/L (ref 22–32)
Calcium: 9.3 mg/dL (ref 8.9–10.3)
Chloride: 108 mmol/L (ref 98–111)
Creatinine, Ser: 1.14 mg/dL (ref 0.61–1.24)
GFR calc Af Amer: >60 mL/min (ref >60)
GFR calc non Af Amer: >60 mL/min (ref >60)
Glucose, Bld: 129 mg/dL — ABNORMAL HIGH (ref 70–99)
Potassium: 3.8 mmol/L (ref 3.5–5.1)
Sodium: 139 mmol/L (ref 135–145)

## 2019-01-13 NOTE — ED Notes (Signed)
Pt to STAT desk and states he bleeding all over his clothes and needs to lie down and get some pain meds. Pt states he can do that at home.

## 2019-01-13 NOTE — ED Triage Notes (Signed)
Patient reports rectal bleeding and pain from hemorrhoids.  Patient reports seen last pm for same thing and was better after discharge but bleeding and pain have returned.

## 2019-01-15 ENCOUNTER — Observation Stay
Admission: AD | Admit: 2019-01-15 | Discharge: 2019-01-16 | Disposition: A | Discharge status: Home / Self Care | Payer: 59 | Source: Ambulatory Visit | Attending: Surgery | Admitting: Surgery

## 2019-01-15 ENCOUNTER — Telehealth: Payer: Self-pay

## 2019-01-15 ENCOUNTER — Other Ambulatory Visit: Payer: Self-pay

## 2019-01-15 ENCOUNTER — Encounter: Payer: Self-pay | Admitting: Surgery

## 2019-01-15 ENCOUNTER — Ambulatory Visit (INDEPENDENT_AMBULATORY_CARE_PROVIDER_SITE_OTHER): Payer: 59 | Admitting: Surgery

## 2019-01-15 VITALS — BP 137/94 | HR 100 | Temp 95.4°F | Ht 72.0 in | Wt 174.4 lb

## 2019-01-15 DIAGNOSIS — K648 Other hemorrhoids: Secondary | ICD-10-CM | POA: Diagnosis not present

## 2019-01-15 DIAGNOSIS — K643 Fourth degree hemorrhoids: Principal | ICD-10-CM | POA: Insufficient documentation

## 2019-01-15 DIAGNOSIS — F17200 Nicotine dependence, unspecified, uncomplicated: Secondary | ICD-10-CM | POA: Insufficient documentation

## 2019-01-15 DIAGNOSIS — Z79899 Other long term (current) drug therapy: Secondary | ICD-10-CM | POA: Diagnosis not present

## 2019-01-15 DIAGNOSIS — Z9049 Acquired absence of other specified parts of digestive tract: Secondary | ICD-10-CM | POA: Insufficient documentation

## 2019-01-15 LAB — CBC
HCT: 37 % — ABNORMAL LOW (ref 39.0–52.0)
Hemoglobin: 12 g/dL — ABNORMAL LOW (ref 13.0–17.0)
MCH: 28.4 pg (ref 26.0–34.0)
MCHC: 32.4 g/dL (ref 30.0–36.0)
MCV: 87.5 fL (ref 80.0–100.0)
Platelets: 337 10*3/uL (ref 150–400)
RBC: 4.23 MIL/uL (ref 4.22–5.81)
RDW: 12.6 % (ref 11.5–15.5)
WBC: 14 10*3/uL — ABNORMAL HIGH (ref 4.0–10.5)
nRBC: 0 % (ref 0.0–0.2)

## 2019-01-15 LAB — CREATININE, SERUM
Creatinine, Ser: 1.35 mg/dL — ABNORMAL HIGH (ref 0.61–1.24)
GFR calc Af Amer: >60 mL/min (ref >60)
GFR calc non Af Amer: >60 mL/min (ref >60)

## 2019-01-15 LAB — SURGICAL PCR SCREEN
MRSA, PCR: NEGATIVE
Staphylococcus aureus: NEGATIVE

## 2019-01-15 MED ORDER — ACETAMINOPHEN 500 MG PO TABS
1000.0000 mg | ORAL_TABLET | Freq: Four times a day (QID) | ORAL | Status: DC
  Administered 2019-01-15 – 2019-01-16 (×3): 1000 mg via ORAL
  Filled 2019-01-15 (×4): qty 2

## 2019-01-15 MED ORDER — OXYCODONE HCL 5 MG PO TABS: 5.0000 mg | ORAL_TABLET | ORAL | Status: DC | PRN

## 2019-01-15 MED ORDER — KETOROLAC TROMETHAMINE 30 MG/ML IJ SOLN
30.0000 mg | Freq: Four times a day (QID) | INTRAMUSCULAR | Status: DC
  Administered 2019-01-15 – 2019-01-16 (×3): 30 mg via INTRAVENOUS
  Filled 2019-01-15 (×4): qty 1

## 2019-01-15 MED ORDER — KETOROLAC TROMETHAMINE 30 MG/ML IJ SOLN: 30.0000 mg | Freq: Four times a day (QID) | INTRAMUSCULAR | Status: DC | PRN

## 2019-01-15 MED ORDER — CHLORHEXIDINE GLUCONATE CLOTH 2 % EX PADS
6.0000 | MEDICATED_PAD | Freq: Once | CUTANEOUS | Status: AC
  Administered 2019-01-16: 6 via TOPICAL

## 2019-01-15 MED ORDER — ONDANSETRON 4 MG PO TBDP: 4.0000 mg | ORAL_TABLET | Freq: Four times a day (QID) | ORAL | Status: DC | PRN

## 2019-01-15 MED ORDER — ZOLPIDEM TARTRATE 5 MG PO TABS
5.0000 mg | ORAL_TABLET | Freq: Every evening | ORAL | Status: DC | PRN
  Administered 2019-01-15: 5 mg via ORAL
  Filled 2019-01-15: qty 1

## 2019-01-15 MED ORDER — SODIUM CHLORIDE 0.9 % IV SOLN
2.0000 g | INTRAVENOUS | Status: AC
  Administered 2019-01-16: 2 g via INTRAVENOUS
  Filled 2019-01-15 (×2): qty 2

## 2019-01-15 MED ORDER — ONDANSETRON HCL 4 MG/2ML IJ SOLN
4.0000 mg | Freq: Four times a day (QID) | INTRAMUSCULAR | Status: DC | PRN
  Administered 2019-01-15: 4 mg via INTRAVENOUS
  Filled 2019-01-15: qty 2

## 2019-01-15 MED ORDER — HEPARIN SODIUM (PORCINE) 5000 UNIT/ML IJ SOLN
5000.0000 [IU] | Freq: Three times a day (TID) | INTRAMUSCULAR | Status: DC
  Administered 2019-01-16 (×2): 5000 [IU] via SUBCUTANEOUS
  Filled 2019-01-15 (×2): qty 1

## 2019-01-15 MED ORDER — SODIUM CHLORIDE 0.9 % IV SOLN
INTRAVENOUS | Status: DC
  Administered 2019-01-15 – 2019-01-16 (×3): via INTRAVENOUS

## 2019-01-15 MED ORDER — CHLORHEXIDINE GLUCONATE CLOTH 2 % EX PADS
6.0000 | MEDICATED_PAD | Freq: Once | CUTANEOUS | Status: AC
  Administered 2019-01-15: 17:00:00 6 via TOPICAL

## 2019-01-15 MED ORDER — MORPHINE SULFATE (PF) 2 MG/ML IV SOLN
2.0000 mg | INTRAVENOUS | Status: DC | PRN
  Administered 2019-01-15 (×2): 2 mg via INTRAVENOUS
  Filled 2019-01-15 (×2): qty 1

## 2019-01-15 MED ORDER — MUPIROCIN 2 % EX OINT
1.0000 "application " | TOPICAL_OINTMENT | Freq: Two times a day (BID) | CUTANEOUS | Status: DC
  Filled 2019-01-15: qty 22

## 2019-01-15 NOTE — H&P (View-Only) (Signed)
Patient ID: [name], male   DOB: Jul 27, 1970, 48 y.o.   MRN: [id number]  HPI [name] is a 48 y.o. male seen in consultation at the request of Dr. Jerl Santos for hemorrhoids.  He reports that he has been having anorectal pain for about 3 days ago and today it has been severe.  The pain is sharp constant worsening when he sits down.  He is unable to sit.  He is very anxious and in a lot of pain.  Apparently was given some pain medicine in the emergency room and was sent home.  He also reports significant drainage from the anorectal area.  The patient has had a history of Crohn's disease with multiple bowel resections and a history of diverticulitis with a history of sigmoid colectomy that had an anastomotic leak and required a diverting loop ileostomy.  This was subsequently reversed several years ago.  Please note that all this care was done at [facility] in [place].\ He is able to perform more than 4 METS of activity without any shortness of breath or chest pain.  HPI  Past Medical History:  Diagnosis Date  . Diverticulitis   . Small bowel obstruction [facility])     Past Surgical History:  Procedure Laterality Date  . arm surgery    . CHOLECYSTECTOMY    . COLON RESECTION    . ileostomy takedown  2011  . loop ileostomy and repair of anastomotic leak at sigmoid colon  2011    History reviewed. No pertinent family history.  Social History Social History   Tobacco Use  . Smoking status: Smoker, Current Status Unknown  . Smokeless tobacco: Never Used  Substance Use Topics  . Alcohol use: No  . Drug use: No    No Known Allergies  Current Outpatient Medications  Medication Sig Dispense Refill  . ciprofloxacin (CIPRO) 500 MG tablet Take 1 tablet (500 mg total) by mouth 2 (two) times daily. 14 tablet 0  . dicyclomine (BENTYL) 20 MG tablet Take 1 tablet (20 mg total) by mouth every 6 (six) hours as needed. 20 tablet 0   No current facility-administered medications for this visit.       Review of Systems Full ROS  was asked and was negative except for the information on the HPI  Physical Exam Blood pressure (!) 137/94, pulse 100, temperature (!) 95.4 F (35.2 C), temperature source Temporal, height 6' (1.829 m), weight 174 lb 6.4 oz (79.1 kg), SpO2 97 %. CONSTITUTIONAL: Patient on able to sit down he is diaphoretic, miserable and in a lot of pain EYES: Pupils are equal, round, , Sclera are non-icteric. EARS, NOSE, MOUTH AND THROAT: The oropharynx is clear. The oral mucosa is pink and moist. Hearing is intact to voice. LYMPH NODES:  Lymph nodes in the neck are normal. RESPIRATORY:  Lungs are clear. There is normal respiratory effort, with equal breath sounds bilaterally, and without pathologic use of accessory muscles. CARDIOVASCULAR: Heart is regular without murmurs, gallops, or rubs. GI: The abdomen is soft, nontender, and nondistended. There are no palpable masses. There is no hepatosplenomegaly. There are normal bowel sounds in all quadrants. RECTAL: Evidence of circumferential hemorrhoidal cushions that are strangulated.  Since it is circumferential in nature and I am unable to perform a good exam I am unable to tell whether this is bilateral or unilateral.  More likely given that is fully circumferential it likely involves at least 2 cushions.  There is evidence of some duskiness and extremely  and exquisite tenderness to light palpation. There is no evidence of perineal sepsis MUSCULOSKELETAL: Normal muscle strength and tone. No cyanosis or edema.   SKIN: Turgor is good and there are no pathologic skin lesions or ulcers. NEUROLOGIC: Motor and sensation is grossly normal. Cranial nerves are grossly intact. PSYCH:  Oriented to person, place and time. Affect is normal.  Data Reviewed  I have personally reviewed the patient's imaging, laboratory findings and medical records.    Assessment/Plan 48 year old with severe anorectal pain consistent with strangulated  internal hemorrhoids.  Patient with severe symptoms in need for admission hospitalization pain management and prompt excision of hemorrhoids.  Since Dr. Piscoya is on-call today I have reached out to him and he will graciously perform his surgery tonight. Procedure discussed with the patient in detail.  Risk, benefits and possible complications including but not limited to: Bleeding, infection, recurrence and pain.  The fact that he is in severe pain puts him at increased risk of developing chronic pain.  He understands and wishes to proceed.   Marius Betts, MD FACS General Surgeon 01/15/2019, 3:04 PM   

## 2019-01-15 NOTE — Progress Notes (Signed)
01/15/19 7:15 pm  Due to further delays in the OR schedule for tonight due to unexpected issues with cases, will postpone hemorrhoidectomy for tomorrow.  Dr. Dahlia Byes will be doing the case instead.  Olean Ree, MD

## 2019-01-15 NOTE — Telephone Encounter (Signed)
[name] spoke with [facility] patient placement for direct admit for incarcerated hemorrhoids.  Dawn will call back once a room becomes available.

## 2019-01-15 NOTE — Interval H&P Note (Signed)
History and Physical Interval Note:  01/15/2019 6:17 PM  [name]  has presented today for surgery, with the diagnosis of Incarcerated hemorrhoid.  The various methods of treatment have been discussed with the patient and family. After consideration of risks, benefits and other options for treatment, the patient has consented to  Procedure(s): HEMORRHOIDECTOMY (N/A) as a surgical intervention.  The patient's history has been reviewed, patient examined, no change in status, stable for surgery.  I have reviewed the patient's chart and labs.  Questions were answered to the patient's satisfaction.     Misaki Sozio

## 2019-01-15 NOTE — Telephone Encounter (Signed)
Patient went to hospital RM 224 transfer.

## 2019-01-15 NOTE — Progress Notes (Signed)
Patient ID: [name], male   DOB: Jul 27, 1970, 48 y.o.   MRN: [id number]  HPI [name] is a 48 y.o. male seen in consultation at the request of Dr. Jerl Santos for hemorrhoids.  He reports that he has been having anorectal pain for about 3 days ago and today it has been severe.  The pain is sharp constant worsening when he sits down.  He is unable to sit.  He is very anxious and in a lot of pain.  Apparently was given some pain medicine in the emergency room and was sent home.  He also reports significant drainage from the anorectal area.  The patient has had a history of Crohn's disease with multiple bowel resections and a history of diverticulitis with a history of sigmoid colectomy that had an anastomotic leak and required a diverting loop ileostomy.  This was subsequently reversed several years ago.  Please note that all this care was done at [facility] in [place].\ He is able to perform more than 4 METS of activity without any shortness of breath or chest pain.  HPI  Past Medical History:  Diagnosis Date  . Diverticulitis   . Small bowel obstruction [facility])     Past Surgical History:  Procedure Laterality Date  . arm surgery    . CHOLECYSTECTOMY    . COLON RESECTION    . ileostomy takedown  2011  . loop ileostomy and repair of anastomotic leak at sigmoid colon  2011    History reviewed. No pertinent family history.  Social History Social History   Tobacco Use  . Smoking status: Smoker, Current Status Unknown  . Smokeless tobacco: Never Used  Substance Use Topics  . Alcohol use: No  . Drug use: No    No Known Allergies  Current Outpatient Medications  Medication Sig Dispense Refill  . ciprofloxacin (CIPRO) 500 MG tablet Take 1 tablet (500 mg total) by mouth 2 (two) times daily. 14 tablet 0  . dicyclomine (BENTYL) 20 MG tablet Take 1 tablet (20 mg total) by mouth every 6 (six) hours as needed. 20 tablet 0   No current facility-administered medications for this visit.       Review of Systems Full ROS  was asked and was negative except for the information on the HPI  Physical Exam Blood pressure (!) 137/94, pulse 100, temperature (!) 95.4 F (35.2 C), temperature source Temporal, height 6' (1.829 m), weight 174 lb 6.4 oz (79.1 kg), SpO2 97 %. CONSTITUTIONAL: Patient on able to sit down he is diaphoretic, miserable and in a lot of pain EYES: Pupils are equal, round, , Sclera are non-icteric. EARS, NOSE, MOUTH AND THROAT: The oropharynx is clear. The oral mucosa is pink and moist. Hearing is intact to voice. LYMPH NODES:  Lymph nodes in the neck are normal. RESPIRATORY:  Lungs are clear. There is normal respiratory effort, with equal breath sounds bilaterally, and without pathologic use of accessory muscles. CARDIOVASCULAR: Heart is regular without murmurs, gallops, or rubs. GI: The abdomen is soft, nontender, and nondistended. There are no palpable masses. There is no hepatosplenomegaly. There are normal bowel sounds in all quadrants. RECTAL: Evidence of circumferential hemorrhoidal cushions that are strangulated.  Since it is circumferential in nature and I am unable to perform a good exam I am unable to tell whether this is bilateral or unilateral.  More likely given that is fully circumferential it likely involves at least 2 cushions.  There is evidence of some duskiness and extremely  and exquisite tenderness to light palpation. There is no evidence of perineal sepsis MUSCULOSKELETAL: Normal muscle strength and tone. No cyanosis or edema.   SKIN: Turgor is good and there are no pathologic skin lesions or ulcers. NEUROLOGIC: Motor and sensation is grossly normal. Cranial nerves are grossly intact. PSYCH:  Oriented to person, place and time. Affect is normal.  Data Reviewed  I have personally reviewed the patient's imaging, laboratory findings and medical records.    Assessment/Plan 48 year old with severe anorectal pain consistent with strangulated  internal hemorrhoids.  Patient with severe symptoms in need for admission hospitalization pain management and prompt excision of hemorrhoids.  Since Dr. Aleen Campi is on-call today I have reached out to him and he will graciously perform his surgery tonight. Procedure discussed with the patient in detail.  Risk, benefits and possible complications including but not limited to: Bleeding, infection, recurrence and pain.  The fact that he is in severe pain puts him at increased risk of developing chronic pain.  He understands and wishes to proceed.   Sterling Big, MD FACS General Surgeon 01/15/2019, 3:04 PM

## 2019-01-16 ENCOUNTER — Encounter
Admission: AD | Disposition: A | Discharge status: Home / Self Care | Payer: Self-pay | Source: Ambulatory Visit | Attending: Surgery

## 2019-01-16 ENCOUNTER — Inpatient Hospital Stay: Payer: 59 | Admitting: Anesthesiology

## 2019-01-16 DIAGNOSIS — K648 Other hemorrhoids: Secondary | ICD-10-CM

## 2019-01-16 DIAGNOSIS — K643 Fourth degree hemorrhoids: Secondary | ICD-10-CM | POA: Diagnosis not present

## 2019-01-16 HISTORY — PX: HEMORRHOID SURGERY: SHX153

## 2019-01-16 SURGERY — HEMORRHOIDECTOMY
Anesthesia: General

## 2019-01-16 SURGERY — HEMORRHOIDECTOMY
Anesthesia: Choice

## 2019-01-16 MED ORDER — PROPOFOL 10 MG/ML IV BOLUS
INTRAVENOUS | Status: AC
  Filled 2019-01-16: qty 20

## 2019-01-16 MED ORDER — MEPERIDINE HCL 50 MG/ML IJ SOLN: 6.2500 mg | INTRAMUSCULAR | Status: DC | PRN

## 2019-01-16 MED ORDER — DEXAMETHASONE SODIUM PHOSPHATE 10 MG/ML IJ SOLN
INTRAMUSCULAR | Status: DC | PRN
  Administered 2019-01-16: 10 mg via INTRAVENOUS

## 2019-01-16 MED ORDER — PROPOFOL 10 MG/ML IV BOLUS
INTRAVENOUS | Status: DC | PRN
  Administered 2019-01-16: 200 mg via INTRAVENOUS

## 2019-01-16 MED ORDER — FENTANYL CITRATE (PF) 100 MCG/2ML IJ SOLN
INTRAMUSCULAR | Status: AC
  Filled 2019-01-16: qty 2

## 2019-01-16 MED ORDER — FENTANYL CITRATE (PF) 100 MCG/2ML IJ SOLN: 25.0000 ug | INTRAMUSCULAR | Status: DC | PRN

## 2019-01-16 MED ORDER — SODIUM CHLORIDE 0.9 % IV BOLUS
1000.0000 mL | Freq: Once | INTRAVENOUS | Status: AC
  Administered 2019-01-16: 1000 mL via INTRAVENOUS

## 2019-01-16 MED ORDER — EPHEDRINE SULFATE 50 MG/ML IJ SOLN
INTRAMUSCULAR | Status: AC
  Filled 2019-01-16: qty 1

## 2019-01-16 MED ORDER — ACETAMINOPHEN 10 MG/ML IV SOLN
INTRAVENOUS | Status: AC
  Filled 2019-01-16: qty 100

## 2019-01-16 MED ORDER — PROMETHAZINE HCL 25 MG/ML IJ SOLN: 6.2500 mg | INTRAMUSCULAR | Status: DC | PRN

## 2019-01-16 MED ORDER — MIDAZOLAM HCL 2 MG/2ML IJ SOLN
INTRAMUSCULAR | Status: AC
  Filled 2019-01-16: qty 2

## 2019-01-16 MED ORDER — KETOROLAC TROMETHAMINE 30 MG/ML IJ SOLN
INTRAMUSCULAR | Status: DC | PRN
  Administered 2019-01-16: 30 mg via INTRAVENOUS

## 2019-01-16 MED ORDER — BUPIVACAINE LIPOSOME 1.3 % IJ SUSP
INTRAMUSCULAR | Status: DC | PRN
  Administered 2019-01-16: 20 mL

## 2019-01-16 MED ORDER — ONDANSETRON HCL 4 MG/2ML IJ SOLN
INTRAMUSCULAR | Status: DC | PRN
  Administered 2019-01-16: 4 mg via INTRAVENOUS

## 2019-01-16 MED ORDER — LIDOCAINE HCL (PF) 2 % IJ SOLN
INTRAMUSCULAR | Status: AC
  Filled 2019-01-16: qty 10

## 2019-01-16 MED ORDER — LACTATED RINGERS IV SOLN
INTRAVENOUS | Status: DC | PRN
  Administered 2019-01-16: 11:00:00 via INTRAVENOUS

## 2019-01-16 MED ORDER — MIDAZOLAM HCL 2 MG/2ML IJ SOLN
INTRAMUSCULAR | Status: DC | PRN
  Administered 2019-01-16: 2 mg via INTRAVENOUS

## 2019-01-16 MED ORDER — LIDOCAINE HCL (CARDIAC) PF 100 MG/5ML IV SOSY
PREFILLED_SYRINGE | INTRAVENOUS | Status: DC | PRN
  Administered 2019-01-16: 100 mg via INTRAVENOUS

## 2019-01-16 MED ORDER — FENTANYL CITRATE (PF) 100 MCG/2ML IJ SOLN
INTRAMUSCULAR | Status: DC | PRN
  Administered 2019-01-16: 25 ug via INTRAVENOUS
  Administered 2019-01-16: 50 ug via INTRAVENOUS
  Administered 2019-01-16: 25 ug via INTRAVENOUS

## 2019-01-16 MED ORDER — OXYCODONE HCL 5 MG/5ML PO SOLN: 5.0000 mg | Freq: Once | ORAL | Status: DC | PRN

## 2019-01-16 MED ORDER — KETOROLAC TROMETHAMINE 30 MG/ML IJ SOLN
INTRAMUSCULAR | Status: AC
  Filled 2019-01-16: qty 1

## 2019-01-16 MED ORDER — GLYCOPYRROLATE 0.2 MG/ML IJ SOLN
INTRAMUSCULAR | Status: DC | PRN
  Administered 2019-01-16: .2 mg via INTRAVENOUS

## 2019-01-16 MED ORDER — OXYCODONE HCL 5 MG PO TABS: 5.0000 mg | ORAL_TABLET | ORAL | 0 refills | Status: DC | PRN

## 2019-01-16 MED ORDER — OXYCODONE HCL 5 MG PO TABS: 5.0000 mg | ORAL_TABLET | Freq: Once | ORAL | Status: DC | PRN

## 2019-01-16 MED ORDER — ACETAMINOPHEN 10 MG/ML IV SOLN
INTRAVENOUS | Status: DC | PRN
  Administered 2019-01-16: 1000 mg via INTRAVENOUS

## 2019-01-16 SURGICAL SUPPLY — 28 items
BRIEF STRETCH MATERNITY 2XLG (MISCELLANEOUS) ×3 IMPLANT
CANISTER SUCT 1200ML W/VALVE (MISCELLANEOUS) ×3 IMPLANT
COVER WAND RF STERILE (DRAPES) ×3 IMPLANT
DRAPE PERI LITHO V/GYN (MISCELLANEOUS) ×3 IMPLANT
DRAPE UNDER BUTTOCK W/FLU (DRAPES) ×3 IMPLANT
DRSG GAUZE FLUFF 36X18 (GAUZE/BANDAGES/DRESSINGS) ×3 IMPLANT
ELECT REM PT RETURN 9FT ADLT (ELECTROSURGICAL) ×3
ELECTRODE REM PT RTRN 9FT ADLT (ELECTROSURGICAL) ×1 IMPLANT
GLOVE SURG SYN 7.0 (GLOVE) ×3 IMPLANT
GLOVE SURG SYN 7.5  E (GLOVE) ×2
GLOVE SURG SYN 7.5 E (GLOVE) ×1 IMPLANT
GOWN STRL REUS W/ TWL LRG LVL3 (GOWN DISPOSABLE) ×2 IMPLANT
GOWN STRL REUS W/TWL LRG LVL3 (GOWN DISPOSABLE) ×4
KIT TURNOVER KIT A (KITS) ×3 IMPLANT
LABEL OR SOLS (LABEL) ×3 IMPLANT
LIGASURE IMPACT 36 18CM CVD LR (INSTRUMENTS) IMPLANT
NEEDLE HYPO 22GX1.5 SAFETY (NEEDLE) ×3 IMPLANT
NS IRRIG 500ML POUR BTL (IV SOLUTION) ×3 IMPLANT
PACK BASIN MINOR ARMC (MISCELLANEOUS) ×3 IMPLANT
PAD OB MATERNITY 4.3X12.25 (PERSONAL CARE ITEMS) ×3 IMPLANT
PAD PREP 24X41 OB/GYN DISP (PERSONAL CARE ITEMS) ×3 IMPLANT
SOL PREP PVP 2OZ (MISCELLANEOUS) ×3
SOLUTION PREP PVP 2OZ (MISCELLANEOUS) ×1 IMPLANT
SURGILUBE 2OZ TUBE FLIPTOP (MISCELLANEOUS) ×3 IMPLANT
SUT VIC AB 2-0 SH 27 (SUTURE) ×4
SUT VIC AB 2-0 SH 27XBRD (SUTURE) ×2 IMPLANT
SYR 10ML LL (SYRINGE) ×3 IMPLANT
SYR BULB IRRIG 60ML STRL (SYRINGE) ×3 IMPLANT

## 2019-01-16 SURGICAL SUPPLY — 26 items
BLADE CLIPPER SURG (BLADE) ×3 IMPLANT
BLADE SURG 15 STRL LF DISP TIS (BLADE) ×1 IMPLANT
BLADE SURG 15 STRL SS (BLADE) ×2
BRIEF STRETCH MATERNITY 2XLG (MISCELLANEOUS) ×3 IMPLANT
CANISTER SUCT 1200ML W/VALVE (MISCELLANEOUS) ×3 IMPLANT
COVER WAND RF STERILE (DRAPES) ×3 IMPLANT
DRAPE 3/4 80X56 (DRAPES) ×3 IMPLANT
DRAPE LEGGINS SURG 28X43 STRL (DRAPES) IMPLANT
DRAPE UNDER BUTTOCK W/FLU (DRAPES) IMPLANT
DRSG GAUZE FLUFF 36X18 (GAUZE/BANDAGES/DRESSINGS) ×3 IMPLANT
ELECT CAUTERY BLADE 6.4 (BLADE) ×3 IMPLANT
ELECT REM PT RETURN 9FT ADLT (ELECTROSURGICAL) ×3
ELECTRODE REM PT RTRN 9FT ADLT (ELECTROSURGICAL) ×1 IMPLANT
GLOVE BIO SURGEON STRL SZ7 (GLOVE) ×3 IMPLANT
GOWN STRL REUS W/ TWL LRG LVL3 (GOWN DISPOSABLE) ×2 IMPLANT
GOWN STRL REUS W/TWL LRG LVL3 (GOWN DISPOSABLE) ×4
NEEDLE HYPO 22GX1.5 SAFETY (NEEDLE) ×3 IMPLANT
NS IRRIG 500ML POUR BTL (IV SOLUTION) ×3 IMPLANT
PACK BASIN MINOR ARMC (MISCELLANEOUS) ×3 IMPLANT
PAD PREP 24X41 OB/GYN DISP (PERSONAL CARE ITEMS) ×3 IMPLANT
SHEARS HARMONIC 9CM CVD (BLADE) ×3 IMPLANT
SOL PREP PVP 2OZ (MISCELLANEOUS) ×3
SOLUTION PREP PVP 2OZ (MISCELLANEOUS) ×1 IMPLANT
SPONGE LAP 18X18 RF (DISPOSABLE) ×3 IMPLANT
SURGILUBE 2OZ TUBE FLIPTOP (MISCELLANEOUS) ×3 IMPLANT
SYR 20ML LL LF (SYRINGE) ×3 IMPLANT

## 2019-01-16 NOTE — Discharge Instructions (Signed)
Hemorrhoids Hemorrhoids are swollen veins that may develop:  In the butt (rectum). These are called internal hemorrhoids.  Around the opening of the butt (anus). These are called external hemorrhoids. Hemorrhoids can cause pain, itching, or bleeding. Most of the time, they do not cause serious problems. They usually get better with diet changes, lifestyle changes, and other home treatments. What are the causes? This condition may be caused by:  Having trouble pooping (constipation).  Pushing hard (straining) to poop.  Watery poop (diarrhea).  Pregnancy.  Being very overweight (obese).  Sitting for long periods of time.  Heavy lifting or other activity that causes you to strain.  Anal sex.  Riding a bike for a long period of time. What are the signs or symptoms? Symptoms of this condition include:  Pain.  Itching or soreness in the butt.  Bleeding from the butt.  Leaking poop.  Swelling in the area.  One or more lumps around the opening of your butt. How is this diagnosed? A doctor can often diagnose this condition by looking at the affected area. The doctor may also:  Do an exam that involves feeling the area with a gloved hand (digital rectal exam).  Examine the area inside your butt using a small tube (anoscope).  Order blood tests. This may be done if you have lost a lot of blood.  Have you get a test that involves looking inside the colon using a flexible tube with a camera on the end (sigmoidoscopy or colonoscopy). How is this treated? This condition can usually be treated at home. Your doctor may tell you to change what you eat, make lifestyle changes, or try home treatments. If these do not help, procedures can be done to remove the hemorrhoids or make them smaller. These may involve:  Placing rubber bands at the base of the hemorrhoids to cut off their blood supply.  Injecting medicine into the hemorrhoids to shrink them.  Shining a type of light  energy onto the hemorrhoids to cause them to fall off.  Doing surgery to remove the hemorrhoids or cut off their blood supply. Follow these instructions at home: Eating and drinking   Eat foods that have a lot of fiber in them. These include whole grains, beans, nuts, fruits, and vegetables.  Ask your doctor about taking products that have added fiber (fibersupplements).  Reduce the amount of fat in your diet. You can do this by: ? Eating low-fat dairy products. ? Eating less red meat. ? Avoiding processed foods.  Drink enough fluid to keep your pee (urine) pale yellow. Managing pain and swelling   Take a warm-water bath (sitz bath) for 20 minutes to ease pain. Do this 3-4 times a day. You may do this in a bathtub or using a portable sitz bath that fits over the toilet.  If told, put ice on the painful area. It may be helpful to use ice between your warm baths. ? Put ice in a plastic bag. ? Place a towel between your skin and the bag. ? Leave the ice on for 20 minutes, 2-3 times a day. General instructions  Take over-the-counter and prescription medicines only as told by your doctor. ? Medicated creams and medicines may be used as told.  Exercise often. Ask your doctor how much and what kind of exercise is best for you.  Go to the bathroom when you have the urge to poop. Do not wait.  Avoid pushing too hard when you poop.  Keep your   butt dry and clean. Use wet toilet paper or moist towelettes after pooping.  Do not sit on the toilet for a long time.  Keep all follow-up visits as told by your doctor. This is important. Contact a doctor if you:  Have pain and swelling that do not get better with treatment or medicine.  Have trouble pooping.  Cannot poop.  Have pain or swelling outside the area of the hemorrhoids. Get help right away if you have:  Bleeding that will not stop. Summary  Hemorrhoids are swollen veins in the butt or around the opening of the butt.   They can cause pain, itching, or bleeding.  Eat foods that have a lot of fiber in them. These include whole grains, beans, nuts, fruits, and vegetables.  Take a warm-water bath (sitz bath) for 20 minutes to ease pain. Do this 3-4 times a day. This information is not intended to replace advice given to you by your health care provider. Make sure you discuss any questions you have with your health care provider. Document Released: 12/16/2007 Document Revised: 03/16/2018 Document Reviewed: 07/28/2017 Elsevier Patient Education  2020 [employer]vier Inc[employer]  

## 2019-01-16 NOTE — Anesthesia Post-op Follow-up Note (Signed)
Anesthesia QCDR form completed.        

## 2019-01-16 NOTE — Progress Notes (Signed)
Nutrition Brief Note  Patient identified on the Malnutrition Screening Tool (MST) Report  48 year old with severe anorectal pain consistent with strangulated internal hemorrhoids now s/p hemorrhoidectomy today  Wt Readings from Last 15 Encounters:  01/15/19 79.1 kg  01/13/19 86.6 kg  01/12/19 86.2 kg  03/16/17 88.5 kg  12/02/14 87.5 kg   Pt admitted for elective surgery. Pt eating 100% of meals prior to NPO. Per chart pt appears weight stable pta.   No nutrition interventions warranted at this time. If nutrition issues arise, please consult RD.   Koleen Distance MS, RD, LDN Pager #- [phone] Office#- [phone] After Hours Pager: [phone]

## 2019-01-16 NOTE — Plan of Care (Signed)

## 2019-01-16 NOTE — Op Note (Signed)
  01/16/2019  11:32 AM  PATIENT:  [name]  48 y.o. male  PRE-OPERATIVE DIAGNOSIS:  Strangulated Internal hemorrhoids  POST-OPERATIVE DIAGNOSIS:  Same  PROCEDURE:   1. Exam under anesthesia 2. Hemorrhoidectomy 2 columns Left anterolateral and left posterolateral   SURGEON:  Surgeon(s) and Role:    * Celita Aron F, MD - Primary  FINDINGS; left large grade IV anterolateral and posterolateral strangulated hemorrhoids.  ANESTHESIA: GEneral  EBL: 5cc  DICTATION:  Patient was explained about the  Procedure in detail.  Risks, benefits and  possible complications and a consent was obtained. The patient taken to the operating room and placed in the lithotomy position with all pressure points padded  Exam reveled large grade IV hemorrhoids on the left making it look circumferentially. Using the harmonic scalpel the cushion was elevated and divided. Exact same maneuver was employed for the posterolateral column. Please note the size of the hemorrhoids were massive but did not seem to involve the sphincteric complex, they did have a large clot and necrosis. Liposomal marcaine injected for analgesia. Second look revealed goof hemostasis. Needle and laparotomy counts were correct and there were no immediate complications  Jules Husbands, MD

## 2019-01-16 NOTE — Progress Notes (Signed)
Preoperative Review   Patient is met in the preoperatively. The history is reviewed in the chart and with the patient. I personally reviewed the options and rationale as well as the risks of this procedure that have been previously discussed with the patient. All questions asked by the patient and/or family were answered to their satisfaction.  Patient agrees to proceed with this procedure at this time.  Diego Pabon M.D. FACS   

## 2019-01-16 NOTE — Anesthesia Preprocedure Evaluation (Signed)
Anesthesia Evaluation  Patient identified by MRN, date of birth, ID band Patient awake    Reviewed: Allergy & Precautions, NPO status , Patient's Chart, lab work & pertinent test results  History of Anesthesia Complications Negative for: history of anesthetic complications  Airway Mallampati: II  TM Distance: >3 FB Neck ROM: Full    Dental  (+) Poor Dentition   Pulmonary neg sleep apnea, neg COPD, former smoker,    breath sounds clear to auscultation- rhonchi (-) wheezing      Cardiovascular Exercise Tolerance: Good (-) hypertension(-) CAD, (-) Past MI, (-) Cardiac Stents and (-) CABG  Rhythm:Regular Rate:Normal - Systolic murmurs and - Diastolic murmurs    Neuro/Psych neg Seizures negative neurological ROS  negative psych ROS   GI/Hepatic negative GI ROS, Neg liver ROS,   Endo/Other  negative endocrine ROSneg diabetes  Renal/GU negative Renal ROS     Musculoskeletal negative musculoskeletal ROS (+)   Abdominal (+) - obese,   Peds  Hematology negative hematology ROS (+)   Anesthesia Other Findings Past Medical History: No date: Diverticulitis No date: Small bowel obstruction (HCC)   Reproductive/Obstetrics                             Anesthesia Physical Anesthesia Plan  ASA: II  Anesthesia Plan: General   Post-op Pain Management:    Induction: Intravenous  PONV Risk Score and Plan: 1 and Ondansetron, Dexamethasone and Midazolam  Airway Management Planned: LMA  Additional Equipment:   Intra-op Plan:   Post-operative Plan:   Informed Consent: I have reviewed the patients History and Physical, chart, labs and discussed the procedure including the risks, benefits and alternatives for the proposed anesthesia with the patient or authorized representative who has indicated his/her understanding and acceptance.     Dental advisory given  Plan Discussed with: CRNA and  Anesthesiologist  Anesthesia Plan Comments:         Anesthesia Quick Evaluation

## 2019-01-16 NOTE — Anesthesia Procedure Notes (Signed)
Procedure Name: LMA Insertion Date/Time: 01/16/2019 11:00 AM Performed by: Allean Found, CRNA Pre-anesthesia Checklist: Patient identified, Patient being monitored, Timeout performed, Emergency Drugs available and Suction available Patient Re-evaluated:Patient Re-evaluated prior to induction Oxygen Delivery Method: Circle system utilized Preoxygenation: Pre-oxygenation with 100% oxygen Induction Type: IV induction Ventilation: Mask ventilation without difficulty LMA: LMA inserted LMA Size: 5.0 Tube type: Oral Number of attempts: 1 Placement Confirmation: positive ETCO2 and breath sounds checked- equal and bilateral Tube secured with: Tape Dental Injury: Teeth and Oropharynx as per pre-operative assessment

## 2019-01-16 NOTE — Progress Notes (Signed)
Surgery service notified: FYI, heart rate can go in the 40's Heart rate 47 currently. IV fluid bolus started.

## 2019-01-16 NOTE — Anesthesia Postprocedure Evaluation (Signed)
Anesthesia Post Note  Patient: [name]  Procedure(s) Performed: HEMORRHOIDECTOMY (N/A )  Patient location during evaluation: PACU Anesthesia Type: General Level of consciousness: awake and alert and oriented Pain management: pain level controlled Vital Signs Assessment: post-procedure vital signs reviewed and stable Respiratory status: spontaneous breathing, nonlabored ventilation and respiratory function stable Cardiovascular status: blood pressure returned to baseline and stable Postop Assessment: no signs of nausea or vomiting Anesthetic complications: no     Last Vitals:  Vitals:   01/16/19 1215 01/16/19 1252  BP: 118/66 112/63  Pulse: 62 (!) 51  Resp: 13 15  Temp: (!) 36.3 C (!) 36.3 C  SpO2: 96% 99%    Last Pain:  Vitals:   01/16/19 1252  TempSrc: Oral  PainSc: 0-No pain                 Aamir Mclinden

## 2019-01-16 NOTE — Discharge Summary (Signed)
[name] SURGICAL DISCHARGE SUMMARY   Patient IDXZANDER [name] MRN: [id number] DOB/AGE: 1970-06-28 48 y.o.  Admit date: 01/15/2019 Discharge date: 01/16/2019  Discharge Diagnoses Patient Active Problem List   Diagnosis Date Noted  . Strangulated hemorrhoids 01/15/2019    Consultants None   Procedures 01/16/2019: 1. Exam under anesthesia 2. Hemorrhoidectomy 2 columns Left anterolateral and left posterolateral   HPI: [name] is a 48 y.o. male seen in consultation at the request of Dr. Jerl Santos for hemorrhoids.  He reports that he has been having anorectal pain for about 3 days ago and today it has been severe.  The pain is sharp constant worsening when he sits down.  He is unable to sit.  He is very anxious and in a lot of pain.  Apparently was given some pain medicine in the emergency room and was sent home.  He also reports significant drainage from the anorectal area.  The patient has had a history of Crohn's disease with multiple bowel resections and a history of diverticulitis with a history of sigmoid colectomy that had an anastomotic leak and required a diverting loop ileostomy.  This was subsequently reversed several years ago.  Please note that all this care was done at [facility] in [place].\ He is able to perform more than 4 METS of activity without any shortness of breath or chest pain.  Hospital Course: Informed consent was obtained and documented, and patient underwent uneventful EUA and hemorrhoidectomy (Dr. Dahlia Byes, 01/16/2019).  Post-operatively, patient's symptoms improved/resolved and advancement of patient's diet and ambulation were well-tolerated. The remainder of patient's hospital course was essentially unremarkable, and discharge planning was initiated accordingly with patient safely able to be discharged home with appropriate discharge instructions, pain control, and outpatient follow-up after all of his questions were answered to his expressed  satisfaction.   Discharge Condition: Good    Allergies as of 01/16/2019   No Known Allergies     Medication List    STOP taking these medications   ciprofloxacin 500 MG tablet Commonly known as: Cipro     TAKE these medications   dicyclomine 20 MG tablet Commonly known as: Bentyl Take 1 tablet (20 mg total) by mouth every 6 (six) hours as needed.   oxyCODONE 5 MG immediate release tablet Commonly known as: Oxy IR/ROXICODONE Take 1 tablet (5 mg total) by mouth every 4 (four) hours as needed for severe pain or breakthrough pain.        Follow-up Information    Pabon, [place] F, MD. Schedule an appointment as soon as possible for a visit in 2 week(s).   Specialty: General Surgery Why: s/p hemorrhoidectomy with Pabon Contact information: [address] [place] [address] [phone]            Time spent on discharge management including discussion of hospital course, clinical condition, outpatient instructions, prescriptions, and follow up with the patient and members of the medical team: <30 minutes  -- Edison Simon , PA-C [facility]  01/16/2019, 3:36 PM [phone] M-F: 7am - 4pm

## 2019-01-16 NOTE — Transfer of Care (Signed)
Immediate Anesthesia Transfer of Care Note  Patient: [name]  Procedure(s) Performed: HEMORRHOIDECTOMY (N/A )  Patient Location: PACU  Anesthesia Type:General  Level of Consciousness: sedated  Airway & Oxygen Therapy: Patient Spontanous Breathing and Patient connected to face mask oxygen  Post-op Assessment: Report given to RN and Post -op Vital signs reviewed and stable  Post vital signs: Reviewed and stable  Last Vitals:  Vitals Value Taken Time  BP 104/70 01/16/19 1145  Temp 36.3 C 01/16/19 1145  Pulse 71 01/16/19 1152  Resp 11 01/16/19 1152  SpO2 99 % 01/16/19 1152  Vitals shown include unvalidated device data.  Last Pain:  Vitals:   01/16/19 1145  TempSrc:   PainSc: Asleep         Complications: No apparent anesthesia complications

## 2019-01-17 ENCOUNTER — Telehealth: Payer: Self-pay | Admitting: Surgery

## 2019-01-17 ENCOUNTER — Encounter: Payer: Self-pay | Admitting: Surgery

## 2019-01-17 LAB — SURGICAL PATHOLOGY

## 2019-01-17 NOTE — Telephone Encounter (Signed)
Telephone Triage Questions    Type of surgery?     HEMORRHOIDECTOMY                  Date?         01/17/2019                     Physician?    DR. Dahlia Byes    Pain ? Stomach pain    Nausea, vomiting, Fever, chills? no   Constipation / Diarrhea? Constipation   Last Bowel movement? Thursday last week   Current Antibiotics or Narcotics? Both.    Please call patient and advise.

## 2019-01-17 NOTE — Telephone Encounter (Signed)
Patient is asking what could he take for the constipation?

## 2019-01-17 NOTE — Telephone Encounter (Signed)
Spoke with patient informed him that he can use over the counter Colace or Miralax as instructed on the label until patient is having regular bowel movements. Patient verbalizes understanding.

## 2019-01-31 ENCOUNTER — Ambulatory Visit (INDEPENDENT_AMBULATORY_CARE_PROVIDER_SITE_OTHER): Payer: 59 | Admitting: Surgery

## 2019-01-31 ENCOUNTER — Encounter: Payer: Self-pay | Admitting: Surgery

## 2019-01-31 ENCOUNTER — Other Ambulatory Visit: Payer: Self-pay

## 2019-01-31 VITALS — BP 139/86 | HR 76 | Temp 95.0°F | Ht 72.0 in | Wt 175.4 lb

## 2019-01-31 DIAGNOSIS — R109 Unspecified abdominal pain: Secondary | ICD-10-CM | POA: Diagnosis not present

## 2019-01-31 DIAGNOSIS — K648 Other hemorrhoids: Secondary | ICD-10-CM

## 2019-01-31 NOTE — Progress Notes (Signed)
Outpatient Surgical Follow Up  01/31/2019  [name] is an 48 y.o. male.   Chief Complaint  Patient presents with  . Routine Post Op    2 week post op hemorrhoiectomy sx 01/16/19 Dr.Towanna Avery    HPI: [name] had a recent open her hemorrhoidectomy for incarcerated hemorrhoids.  He has done well but continues to have some intermittent bleeding.  Pain is significantly better.  He now has new concerns of left inguinal pain that is intermittent and this has been chronic.  Pain is sharp no specific alleviating or aggravating factors.  He has had multiple abdominal operations to include cholecystectomy, colon resection and small bowel resection.  Last operation was several years ago.  He does have chronic abdominal pain related to small ventral hernia.  He denies any fevers any chills.  Past Medical History:  Diagnosis Date  . Diverticulitis   . Small bowel obstruction [facility])     Past Surgical History:  Procedure Laterality Date  . arm surgery    . CHOLECYSTECTOMY    . COLON RESECTION    . HEMORRHOID SURGERY N/A 01/16/2019   Procedure: HEMORRHOIDECTOMY;  Surgeon: Leafy Ro, MD;  Location: [facility] ORS;  Service: General;  Laterality: N/A;  . ileostomy takedown  2011  . loop ileostomy and repair of anastomotic leak at sigmoid colon  2011    History reviewed. No pertinent family history.  Social History:  reports that he quit smoking about 3 years ago. He has never used smokeless tobacco. He reports that he does not drink alcohol or use drugs.  Allergies: No Known Allergies  Medications reviewed.    ROS Full ROS performed and is otherwise negative other than what is stated in HPI   BP 139/86   Pulse 76   Temp (!) 95 F (35 C) (Temporal)   Ht 6' (1.829 m)   Wt 175 lb 6.4 oz (79.6 kg)   BMI 23.79 kg/m   Physical Exam Vitals signs and nursing note reviewed. Exam conducted with a chaperone present.  Constitutional:      General: He is not in acute distress.    Appearance:  Normal appearance. He is normal weight.  Eyes:     General: No scleral icterus.       Right eye: No discharge.        Left eye: No discharge.     Conjunctiva/sclera: Conjunctivae normal.  Neck:     Musculoskeletal: Normal range of motion and neck supple. No neck rigidity.  Pulmonary:     Effort: Pulmonary effort is normal. No respiratory distress.     Breath sounds: No stridor.  Abdominal:     General: Abdomen is flat. There is no distension.     Palpations: Abdomen is soft.     Tenderness: There is abdominal tenderness. There is no right CVA tenderness, guarding or rebound.     Hernia: A hernia is present.     Comments: He does have a tender left inguinal hernia and I do feel a small defect on the left side on the right side I feel a small defect but is limited.  He does have a periumbilical ventral hernia that reduces easily.  It is mildly tender but without peritonitis  Genitourinary:    Comments: Hemorrhoidectomy site healing well without evidence of perineal sepsis Musculoskeletal: Normal range of motion.        General: No swelling.  Lymphadenopathy:     Cervical: No cervical adenopathy.  Skin:  General: Skin is warm and dry.     Capillary Refill: Capillary refill takes less than 2 seconds.     Coloration: Skin is not jaundiced or pale.  Neurological:     General: No focal deficit present.     Mental Status: He is alert and oriented to person, place, and time.  Psychiatric:        Mood and Affect: Mood normal.        Behavior: Behavior normal.        Thought Content: Thought content normal.        Judgment: Judgment normal.       Assessment/Plan: [name] is doing very well after hemorrhoidectomy.  Now one of his issues is left inguinal pain that is likely related to hernia.  He also has a small symptomatic ventral hernia.  Given all h his previous surgeries I am going to obtain a CT scan of the abdomen and pelvis to delineate the abdominal wall anatomy.  He did have a CT  a few months ago but given his changes in his exam I would like to obtain another one.  Please note that the abdominal pain and ventral hernias are a different entity that is separate from his most recent hemorrhoidectomy.   Greater than 50% of the 25 minutes  visit was spent in counseling/coordination of care   Caroleen Hamman, MD [place]

## 2019-01-31 NOTE — Patient Instructions (Addendum)
Patient has been scheduled for a CT abdomen/pelvis with contrast at 10:30 am for November 23rd, 2020 (arrive 10:15am). Prep: NPO 4 hours prior and pick up prep kit. Patient verbalizes understanding.  Surgical Procedures for Hemorrhoids, Care After This sheet gives you information about how to care for yourself after your procedure. Your health care provider may also give you more specific instructions. If you have problems or questions, contact your health care provider. What can I expect after the procedure? After the procedure, it is common to have:  Rectal pain.  Pain when you are having a bowel movement.  Slight rectal bleeding. This is more likely to happen with the first bowel movement after surgery. Follow these instructions at home: Medicines  Take over-the-counter and prescription medicines only as told by your health care provider.  If you were prescribed an antibiotic medicine, use it as told by your health care provider. Do not stop using the antibiotic even if your condition improves.  Ask your health care provider if the medicine prescribed to you requires you to avoid driving or using heavy machinery.  Use a stool softener or a bulk laxative as told by your health care provider. Eating and drinking  Follow instructions from your health care provider about what to eat or drink after your procedure.  You may need to take actions to prevent or treat constipation, such as: ? Drink enough fluid to keep your urine pale yellow. ? Take over-the-counter or prescription medicines. ? Eat foods that are high in fiber, such as beans, whole grains, and fresh fruits and vegetables. ? Limit foods that are high in fat and processed sugars, such as fried or sweet foods. Activity   Rest as told by your health care provider.  Avoid sitting for a long time without moving. Get up to take short walks every 1-2 hours. This is important to improve blood flow and breathing. Ask for help if  you feel weak or unsteady.  Return to your normal activities as told by your health care provider. Ask your health care provider what activities are safe for you.  Do not lift anything that is heavier than 10 lb (4.5 kg), or the limit that you are told, until your health care provider says that it is safe.  Do not strain to have a bowel movement.  Do not spend a long time sitting on the toilet. General instructions   Take warm sitz baths for 15-20 minutes, 2-3 times a day to relieve soreness or itching and to keep the rectal area clean.  Apply ice packs to the area to reduce swelling and pain.  Do not drive for 24 hours if you were given a sedative during your procedure.  Keep all follow-up visits as told by your health care provider. This is important. Contact a health care provider if:  Your pain medicine is not helping.  You have a fever or chills.  You have bad smelling drainage.  You have a lot of swelling.  You become constipated.  You have trouble passing urine. Get help right away if:  You have very bad rectal pain.  You have heavy bleeding from your rectum. Summary  After the procedure, it is common to have pain and slight rectal bleeding.  Take warm sitz baths for 15-20 minutes, 2-3 times a day to relieve soreness or itching and to keep the rectal area clean.  Avoid straining when having a bowel movement.  Eat foods that are high in fiber, such  as beans, whole grains, and fresh fruits and vegetables.  Take over-the-counter and prescription medicines only as told by your health care provider. This information is not intended to replace advice given to you by your health care provider. Make sure you discuss any questions you have with your health care provider. Document Released: 05/29/2003 Document Revised: 01/24/2018 Document Reviewed: 01/24/2018 Elsevier Patient Education  2020 [employer].

## 2019-02-12 ENCOUNTER — Ambulatory Visit
Admission: RE | Admit: 2019-02-12 | Discharge: 2019-02-12 | Disposition: A | Discharge status: Home / Self Care | Payer: 59 | Source: Ambulatory Visit | Attending: Surgery | Admitting: Surgery

## 2019-02-12 ENCOUNTER — Other Ambulatory Visit: Payer: Self-pay

## 2019-02-12 DIAGNOSIS — R109 Unspecified abdominal pain: Secondary | ICD-10-CM

## 2019-02-12 HISTORY — DX: Systemic involvement of connective tissue, unspecified: M35.9

## 2019-02-12 MED ORDER — IOHEXOL 300 MG/ML  SOLN
100.0000 mL | Freq: Once | INTRAMUSCULAR | Status: AC | PRN
  Administered 2019-02-12: 100 mL via INTRAVENOUS

## 2019-02-14 ENCOUNTER — Encounter: Payer: Self-pay | Admitting: Surgery

## 2019-02-19 ENCOUNTER — Telehealth: Payer: Self-pay

## 2019-02-19 NOTE — Telephone Encounter (Signed)
Spoke with patient notified him of his recent CT results "he does have bilateral inguinal hernias" per Dr.Pabon. Patient would like to keep scheduled appointment on 02/26/19 at 11:00a to discuss the next steps for the hernias. Patient verbalized understanding.

## 2019-02-19 NOTE — Telephone Encounter (Signed)
-----   Message from Jules Husbands, MD sent at 02/18/2019  9:45 AM EST ----- Please let him know he does have bilateral inguinal hernias on CT ----- Message ----- From: Interface, Rad Results In Sent: 02/12/2019   3:01 PM EST To: Jules Husbands, MD

## 2019-02-26 ENCOUNTER — Encounter: Payer: Self-pay | Admitting: Surgery

## 2019-02-28 ENCOUNTER — Encounter: Payer: Self-pay | Admitting: Gastroenterology

## 2019-02-28 ENCOUNTER — Ambulatory Visit (INDEPENDENT_AMBULATORY_CARE_PROVIDER_SITE_OTHER): Payer: 59 | Admitting: Gastroenterology

## 2019-02-28 ENCOUNTER — Telehealth: Payer: Self-pay | Admitting: Gastroenterology

## 2019-02-28 ENCOUNTER — Other Ambulatory Visit: Payer: Self-pay

## 2019-02-28 VITALS — BP 125/80 | HR 74 | Temp 97.1°F | Ht 72.0 in | Wt 182.4 lb

## 2019-02-28 DIAGNOSIS — R109 Unspecified abdominal pain: Secondary | ICD-10-CM | POA: Diagnosis not present

## 2019-02-28 DIAGNOSIS — R1084 Generalized abdominal pain: Secondary | ICD-10-CM

## 2019-02-28 DIAGNOSIS — R140 Abdominal distension (gaseous): Secondary | ICD-10-CM | POA: Diagnosis not present

## 2019-02-28 DIAGNOSIS — K56600 Partial intestinal obstruction, unspecified as to cause: Secondary | ICD-10-CM

## 2019-02-28 MED ORDER — DICYCLOMINE HCL 20 MG PO TABS: 20.0000 mg | ORAL_TABLET | Freq: Three times a day (TID) | ORAL | 1 refills | Status: DC

## 2019-02-28 MED ORDER — RIFAXIMIN 550 MG PO TABS: 550.0000 mg | ORAL_TABLET | Freq: Two times a day (BID) | ORAL | 0 refills | Status: DC

## 2019-02-28 NOTE — Progress Notes (Signed)
[name], MD [address]  [address]  [place], [place] [address]  Main: [phone]  Fax: [phone]     Gastroenterology Consultation  Referring Provider:     Leafy Ro, MD Primary Care Physician:  [name], MD Primary Gastroenterologist:  Dr. [name] Reason for Consultation: Abdominal pain, h/o sbo         HPI:   [name] is a 48 y.o. male referred by Dr. [name], MD  for consultation & management of chronic abdominal pain and recurrent partial SBO.  For last 4-5 years, patient has been experiencing recurrent episodes of severe abdominal pain in different locations, associated with abdominal bloating, nausea and vomiting followed by relief of abdominal pain.  He had multiple ER visits as well as several admissions secondary to partial small bowel obstructions ever since he had complicated surgery in 2011.  Most recently, admitted to [facility] on 01/15/2019 secondary to severe anorectal pain, found to have strangulated internal hemorrhoids, underwent hemorrhoidectomy of the left lateral hemorrhoids by Dr. Everlene Farrier.  Patient is referred to [facility] by Dr. Everlene Farrier for further management of abdominal pain and history of partial SBO and to evaluate for possible Crohn's.  Patient is discharged home on dicyclomine and he thinks that the only medication which has helped him so far.  Today, he does not have active abdominal symptoms since admission.  However, he continues to have mild to moderate flareups at least 2-3 times a month.  He has adjusted his diet to minimize his symptoms.  Cannot tolerate high roughage foods at all.  His weight fluctuates but overall stable within a certain range.  He does not have anemia.  He does report fatigue.  His bowel movements fluctuate from 2-3 times a day to 3 to 4 days without a BM associated with significant straining.  He denies rectal bleeding at this time.  He thinks he is recovering well from hemorrhoidectomy.  He  denies smoking or alcohol use.  He works for [employer], in [place]  Patient has been followed by a gastroenterologist, Dr. [name] in [place] until recently.  Patient tells me that at some point he was told he has Crohn's disease.  Patient had multiple GI surgeries as listed below Hemorrhoidectomy in 2001 Cholecystectomy in 12/2002 Sigmoid colectomy on 07/19/2009 complicated by anastomotic leak of the sigmoid colon with abscesses and fistula resulting in repair of the anastomotic leak, diverting loop ileostomy and small bowel resection in 09/08/2009 Takedown of loop ileostomy 11/20/2009 Hemorrhoidectomy 01/16/2019  NSAIDs: None  Antiplts/Anticoagulants/Anti thrombotics: None  GI Procedures: Colonoscopy 11/11/1999 I. SMALL BOWEL, ILEUM BIOPSY: UNREMARKABLE SMALL BOWEL MUCOSA.  NO VILLOUS ATROPHY, INFLAMMATION OR OTHER ABNORMALITIES PRESENT.   II. COLON, BIOPSY: UNREMARKABLE COLONIC MUCOSA. NO SIGNIFICANT  INFLAMMATION OR OTHER ABNORMALITIES IDENTIFIED.   Colonoscopy 11/29/2000 I. SMALL BOWEL, ILEUM BIOPSY: UNREMARKABLE SMALL BOWEL MUCOSA.  NO VILLOUS ATROPHY, INFLAMMATION OR OTHER ABNORMALITIES PRESENT.   II. COLON, BIOPSY: UNREMARKABLE COLONIC MUCOSA. NO SIGNIFICANT  INFLAMMATION OR OTHER ABNORMALITIES IDENTIFIED.   Colonoscopy 02/28/2004 COLON, BIOPSY: NO ACTIVE COLITIS OR DYSPLASIA IDENTIFIED. SEE  COMMENT (BIOPSIES, RECTOSIGMOID)   COMMENT  There is benign colorectal mucosa in which there is mild to  moderate chronic inflammation within the lamina propria. At the  deep edge of one of the fragments there is a superficial aspect  of a mucosal lymphoid aggregate. This could conceivable account  for the nodularity noted colonoscopically. There is a rare gland  that contains a neutrophil  but otherwise no active inflammation  is seen and the findings are non-specific. (JAS:gt) 03/02/04  Past Medical History:  Diagnosis Date  . Collagen vascular disease  ([place])   . Diverticulitis   . Small bowel obstruction [facility])     Past Surgical History:  Procedure Laterality Date  . arm surgery    . CHOLECYSTECTOMY    . COLON RESECTION    . HEMORRHOID SURGERY N/A 01/16/2019   Procedure: HEMORRHOIDECTOMY;  Surgeon: Jules Husbands, MD;  Location: [facility] ORS;  Service: General;  Laterality: N/A;  . ileostomy takedown  2011  . loop ileostomy and repair of anastomotic leak at sigmoid colon  2011    Current Outpatient Medications:  .  dicyclomine (BENTYL) 20 MG tablet, Take 1 tablet (20 mg total) by mouth 3 (three) times daily before meals., Disp: 90 tablet, Rfl: 1 .  bisacodyl (DULCOLAX) 5 MG EC tablet, Take 5 mg by mouth daily as needed for moderate constipation., Disp: , Rfl:  .  docusate sodium (COLACE) 100 MG capsule, Take 100 mg by mouth 2 (two) times daily., Disp: , Rfl:  .  rifaximin (XIFAXAN) 550 MG TABS tablet, Take 1 tablet (550 mg total) by mouth 2 (two) times daily for 14 days., Disp: 28 tablet, Rfl: 0   History reviewed. No pertinent family history.   Social History   Tobacco Use  . Smoking status: Former Smoker    Quit date: 2017    Years since quitting: 3.9  . Smokeless tobacco: Never Used  Substance Use Topics  . Alcohol use: No  . Drug use: No    Allergies as of 02/28/2019  . (No Known Allergies)    Review of Systems:    All systems reviewed and negative except where noted in HPI.   Physical Exam:  BP 125/80   Pulse 74   Temp (!) 97.1 F (36.2 C) (Temporal)   Ht 6' (1.829 m)   Wt 182 lb 6.4 oz (82.7 kg)   BMI 24.74 kg/m  No LMP for male patient.  General:   Alert,  Well-developed, well-nourished, pleasant and cooperative in NAD Head:  Normocephalic and atraumatic. Eyes:  Sclera clear, no icterus.   Conjunctiva pink. Ears:  Normal auditory acuity. Nose:  No deformity, discharge, or lesions. Mouth:  No deformity or lesions,oropharynx pink & moist. Neck:  Supple; no masses or thyromegaly. Lungs:  Respirations  even and unlabored.  Clear throughout to auscultation.   No wheezes, crackles, or rhonchi. No acute distress. Heart:  Regular rate and rhythm; no murmurs, clicks, rubs, or gallops. Abdomen:  Normal bowel sounds. Soft, non-tender and mildly distended, tympanic to percussion, several old healed scars from prior surgeries without masses, hepatosplenomegaly or hernias noted.  No guarding or rebound tenderness.   Rectal: Not performed Msk:  Symmetrical without gross deformities. Good, equal movement & strength bilaterally. Pulses:  Normal pulses noted. Extremities:  No clubbing or edema.  No cyanosis. Neurologic:  Alert and oriented x3;  grossly normal neurologically. Skin:  Intact without significant lesions or rashes. No jaundice. Psych:  Alert and cooperative. Normal mood and affect.  Imaging Studies: Reviewed  Assessment and Plan:   [name] is a 48 y.o. male with history of sigmoid diverticulitis s/p sigmoid colectomy in 7124 complicated by anastomotic leak with abscesses and fistula, s/p diverting loop ileostomy, repair of fistula, and takedown in 11/2009 with chronic intermittent episodes of abdominal pain associated with abdominal bloating, nausea and vomiting consistent with recurrent partial small bowel  obstructions.  Patient had several colonoscopies in the past with no evidence of inflammatory bowel disease including surgical specimens.  His bowel obstructions are most likely secondary to adhesions.  Today, have discussed in length about further management of his GI symptoms, dietary modification, avoidance of processed foods, red meat.  Incorporating more soluble fiber, pured and smoothies to include fruits and vegetables.  He may also have small intestinal bacterial overgrowth contributing to symptoms.  He is also due for screening colonoscopy  Recommend to continue dicyclomine 20 mg 3 times a day as needed Empiric trial of rifaximin 550 mg 2 times a day for 2 weeks for bacterial  overgrowth Check CBC, iron panel, B12 and folate levels, vitamin D, CRP Recommend EGD and colonoscopy with gastric biopsies  Follow up in 4 to 6 weeks   Arlyss Repressohini R Vlasta Baskin, MD

## 2019-02-28 NOTE — Telephone Encounter (Signed)
Rx for Dicyclomine has been resent to pt's pharmacy.

## 2019-02-28 NOTE — Telephone Encounter (Signed)
Patient called stating he just left the office from seeing Dr Marius Ditch & went to the Pharmacy. They are stating they did not receive the prescription. Please call to the [facility] [address].

## 2019-03-01 ENCOUNTER — Other Ambulatory Visit
Admission: RE | Admit: 2019-03-01 | Discharge: 2019-03-01 | Disposition: A | Discharge status: Home / Self Care | Payer: 59 | Source: Ambulatory Visit | Attending: Gastroenterology | Admitting: Gastroenterology

## 2019-03-01 ENCOUNTER — Other Ambulatory Visit: Payer: Self-pay | Admitting: Gastroenterology

## 2019-03-01 ENCOUNTER — Telehealth: Payer: Self-pay

## 2019-03-01 ENCOUNTER — Other Ambulatory Visit: Payer: Self-pay

## 2019-03-01 DIAGNOSIS — E559 Vitamin D deficiency, unspecified: Secondary | ICD-10-CM

## 2019-03-01 DIAGNOSIS — Z01812 Encounter for preprocedural laboratory examination: Secondary | ICD-10-CM | POA: Insufficient documentation

## 2019-03-01 DIAGNOSIS — Z20828 Contact with and (suspected) exposure to other viral communicable diseases: Secondary | ICD-10-CM | POA: Diagnosis not present

## 2019-03-01 LAB — VITAMIN D 25 HYDROXY (VIT D DEFICIENCY, FRACTURES): Vit D, 25-Hydroxy: 7.6 ng/mL — ABNORMAL LOW (ref 30.0–100.0)

## 2019-03-01 LAB — B12 AND FOLATE PANEL
Folate: 10.8 ng/mL (ref >3.0)
Vitamin B-12: 234 pg/mL (ref 232–1245)

## 2019-03-01 LAB — C-REACTIVE PROTEIN: CRP: <1 mg/L (ref 0–10)

## 2019-03-01 LAB — SARS CORONAVIRUS 2 (TAT 6-24 HRS): SARS Coronavirus 2: NEGATIVE

## 2019-03-01 MED ORDER — VITAMIN D (ERGOCALCIFEROL) 1.25 MG (50000 UNIT) PO CAPS: 50000.0000 [IU] | ORAL_CAPSULE | ORAL | 0 refills | Status: AC

## 2019-03-01 NOTE — Telephone Encounter (Signed)
Patient verbalized understanding of lab results  

## 2019-03-01 NOTE — Telephone Encounter (Signed)
-----   Message from Lin Landsman, MD sent at 03/01/2019  2:12 PM EST ----- Results released via MyChart.  Please inform him about vitamin D deficiency and I already sent in a prescription  RV

## 2019-03-02 ENCOUNTER — Encounter: Payer: Self-pay | Admitting: Gastroenterology

## 2019-03-05 ENCOUNTER — Encounter: Payer: Self-pay | Admitting: Gastroenterology

## 2019-03-05 ENCOUNTER — Ambulatory Visit
Admission: RE | Admit: 2019-03-05 | Discharge: 2019-03-05 | Disposition: A | Discharge status: Home / Self Care | Payer: 59 | Attending: Gastroenterology | Admitting: Gastroenterology

## 2019-03-05 ENCOUNTER — Ambulatory Visit: Payer: 59 | Admitting: Anesthesiology

## 2019-03-05 ENCOUNTER — Other Ambulatory Visit: Payer: Self-pay

## 2019-03-05 ENCOUNTER — Encounter: Payer: Self-pay | Admitting: Surgery

## 2019-03-05 ENCOUNTER — Encounter
Admission: RE | Disposition: A | Discharge status: Home / Self Care | Payer: Self-pay | Source: Home / Self Care | Attending: Gastroenterology

## 2019-03-05 DIAGNOSIS — K648 Other hemorrhoids: Secondary | ICD-10-CM | POA: Insufficient documentation

## 2019-03-05 DIAGNOSIS — K222 Esophageal obstruction: Secondary | ICD-10-CM | POA: Diagnosis not present

## 2019-03-05 DIAGNOSIS — K449 Diaphragmatic hernia without obstruction or gangrene: Secondary | ICD-10-CM | POA: Diagnosis not present

## 2019-03-05 DIAGNOSIS — K311 Adult hypertrophic pyloric stenosis: Secondary | ICD-10-CM

## 2019-03-05 DIAGNOSIS — R140 Abdominal distension (gaseous): Secondary | ICD-10-CM

## 2019-03-05 DIAGNOSIS — K2970 Gastritis, unspecified, without bleeding: Secondary | ICD-10-CM | POA: Insufficient documentation

## 2019-03-05 DIAGNOSIS — Z980 Intestinal bypass and anastomosis status: Secondary | ICD-10-CM | POA: Diagnosis not present

## 2019-03-05 DIAGNOSIS — Z79899 Other long term (current) drug therapy: Secondary | ICD-10-CM | POA: Diagnosis not present

## 2019-03-05 DIAGNOSIS — R109 Unspecified abdominal pain: Secondary | ICD-10-CM

## 2019-03-05 DIAGNOSIS — Z1211 Encounter for screening for malignant neoplasm of colon: Secondary | ICD-10-CM | POA: Diagnosis present

## 2019-03-05 DIAGNOSIS — K2100 Gastro-esophageal reflux disease with esophagitis, without bleeding: Secondary | ICD-10-CM

## 2019-03-05 DIAGNOSIS — R1013 Epigastric pain: Secondary | ICD-10-CM | POA: Diagnosis present

## 2019-03-05 DIAGNOSIS — Z87891 Personal history of nicotine dependence: Secondary | ICD-10-CM | POA: Diagnosis not present

## 2019-03-05 DIAGNOSIS — Z8719 Personal history of other diseases of the digestive system: Secondary | ICD-10-CM | POA: Diagnosis not present

## 2019-03-05 DIAGNOSIS — K56600 Partial intestinal obstruction, unspecified as to cause: Secondary | ICD-10-CM

## 2019-03-05 DIAGNOSIS — R1084 Generalized abdominal pain: Secondary | ICD-10-CM

## 2019-03-05 HISTORY — PX: ESOPHAGOGASTRODUODENOSCOPY (EGD) WITH PROPOFOL: SHX5813

## 2019-03-05 HISTORY — PX: COLONOSCOPY WITH PROPOFOL: SHX5780

## 2019-03-05 SURGERY — COLONOSCOPY WITH PROPOFOL
Anesthesia: General

## 2019-03-05 MED ORDER — SODIUM CHLORIDE 0.9 % IV SOLN
INTRAVENOUS | Status: DC
  Administered 2019-03-05: 11:00:00 1000 mL via INTRAVENOUS

## 2019-03-05 MED ORDER — PROPOFOL 500 MG/50ML IV EMUL
INTRAVENOUS | Status: AC
  Filled 2019-03-05: qty 50

## 2019-03-05 MED ORDER — MIDAZOLAM HCL 2 MG/2ML IJ SOLN
INTRAMUSCULAR | Status: AC
  Filled 2019-03-05: qty 2

## 2019-03-05 MED ORDER — FENTANYL CITRATE (PF) 100 MCG/2ML IJ SOLN
INTRAMUSCULAR | Status: AC
  Filled 2019-03-05: qty 2

## 2019-03-05 MED ORDER — MIDAZOLAM HCL 2 MG/2ML IJ SOLN
INTRAMUSCULAR | Status: DC | PRN
  Administered 2019-03-05 (×2): 1 mg via INTRAVENOUS

## 2019-03-05 MED ORDER — PROPOFOL 10 MG/ML IV BOLUS
INTRAVENOUS | Status: DC | PRN
  Administered 2019-03-05: 50 mg via INTRAVENOUS
  Administered 2019-03-05: 180 ug/kg/min via INTRAVENOUS

## 2019-03-05 MED ORDER — OMEPRAZOLE 40 MG PO CPDR: 40.0000 mg | DELAYED_RELEASE_CAPSULE | Freq: Every day | ORAL | 2 refills | Status: DC

## 2019-03-05 MED ORDER — PROPOFOL 10 MG/ML IV BOLUS
INTRAVENOUS | Status: AC
  Filled 2019-03-05: qty 40

## 2019-03-05 MED ORDER — FENTANYL CITRATE (PF) 100 MCG/2ML IJ SOLN
INTRAMUSCULAR | Status: DC | PRN
  Administered 2019-03-05: 50 ug via INTRAVENOUS

## 2019-03-05 NOTE — H&P (Signed)
[name], MD [address]  [address]  [place], [place] [address]  Main: [phone]  Fax: [phone] Pager: [phone]  Primary Care Physician:  [name], MD Primary Gastroenterologist:  Dr. [name]  Pre-Procedure History & Physical: HPI:  [name] is a 48 y.o. male is here for an endoscopy and colonoscopy.   Past Medical History:  Diagnosis Date  . Collagen vascular disease ([facility])   . Diverticulitis   . Small bowel obstruction [facility])     Past Surgical History:  Procedure Laterality Date  . arm surgery    . CHOLECYSTECTOMY    . COLON RESECTION    . COLON SURGERY    . HEMORRHOID SURGERY N/A 01/16/2019   Procedure: HEMORRHOIDECTOMY;  Surgeon: Leafy Ro, MD;  Location: [facility] ORS;  Service: General;  Laterality: N/A;  . ileostomy takedown  2011  . loop ileostomy and repair of anastomotic leak at sigmoid colon  2011    Prior to Admission medications   Medication Sig Start Date End Date Taking? Authorizing Provider  bisacodyl (DULCOLAX) 5 MG EC tablet Take 5 mg by mouth daily as needed for moderate constipation.    [provider]  dicyclomine (BENTYL) 20 MG tablet Take 1 tablet (20 mg total) by mouth 3 (three) times daily before meals. 02/28/19   Toney Reil, MD  docusate sodium (COLACE) 100 MG capsule Take 100 mg by mouth 2 (two) times daily.    [provider]  rifaximin (XIFAXAN) 550 MG TABS tablet Take 1 tablet (550 mg total) by mouth 2 (two) times daily for 14 days. 02/28/19 03/14/19  Toney Reil, MD  Vitamin D, Ergocalciferol, (DRISDOL) 1.25 MG (50000 UT) CAPS capsule Take 1 capsule (50,000 Units total) by mouth every 7 (seven) days for 16 doses. 03/01/19 06/15/19  Toney Reil, MD    Allergies as of 02/28/2019  . (No Known Allergies)    History reviewed. No pertinent family history.  Social History   Socioeconomic History  . Marital status: Significant Other    Spouse name: Not on file   . Number of children: Not on file  . Years of education: Not on file  . Highest education level: Not on file  Occupational History  . Not on file  Tobacco Use  . Smoking status: Former Smoker    Quit date: 2017    Years since quitting: 3.9  . Smokeless tobacco: Never Used  Substance and Sexual Activity  . Alcohol use: No  . Drug use: No  . Sexual activity: Yes  Other Topics Concern  . Not on file  Social History Narrative  . Not on file   Social Determinants of Health   Financial Resource Strain:   . Difficulty of Paying Living Expenses: Not on file  Food Insecurity:   . Worried About Programme researcher, broadcasting/film/video in the Last Year: Not on file  . Ran Out of Food in the Last Year: Not on file  Transportation Needs:   . Lack of Transportation (Medical): Not on file  . Lack of Transportation (Non-Medical): Not on file  Physical Activity:   . Days of Exercise per Week: Not on file  . Minutes of Exercise per Session: Not on file  Stress:   . Feeling of Stress : Not on file  Social Connections:   . Frequency of Communication with Friends and Family: Not on file  . Frequency of Social Gatherings with Friends and Family: Not on file  .  Attends Religious Services: Not on file  . Active Member of Clubs or Organizations: Not on file  . Attends Archivist Meetings: Not on file  . Marital Status: Not on file  Intimate Partner Violence:   . Fear of Current or Ex-Partner: Not on file  . Emotionally Abused: Not on file  . Physically Abused: Not on file  . Sexually Abused: Not on file    Review of Systems: See HPI, otherwise negative ROS  Physical Exam: BP 110/88   Pulse (!) 104   Temp (!) 97.1 F (36.2 C) (Skin)   Resp 18   Ht 6' (1.829 m)   Wt 83.2 kg   SpO2 99%   BMI 24.87 kg/m  General:   Alert,  pleasant and cooperative in NAD Head:  Normocephalic and atraumatic. Neck:  Supple; no masses or thyromegaly. Lungs:  Clear throughout to auscultation.    Heart:   Regular rate and rhythm. Abdomen:  Soft, nontender and nondistended. Normal bowel sounds, without guarding, and without rebound.   Neurologic:  Alert and  oriented x4;  grossly normal neurologically.  Impression/Plan: [name] is here for an endoscopy and colonoscopy to be performed for colon cancer screening and dyspepsia  Risks, benefits, limitations, and alternatives regarding  endoscopy and colonoscopy have been reviewed with the patient.  Questions have been answered.  All parties agreeable.   Sherri Sear, MD  03/05/2019, 11:25 AM

## 2019-03-05 NOTE — Anesthesia Preprocedure Evaluation (Addendum)
Anesthesia Evaluation  Patient identified by MRN, date of birth, ID band Patient awake    Reviewed: Allergy & Precautions, H&P , NPO status , Patient's Chart, lab work & pertinent test results  History of Anesthesia Complications (+) PONV, AWARENESS UNDER ANESTHESIA and history of anesthetic complications  Airway Mallampati: II  TM Distance: >3 FB     Dental  (+) Teeth Intact   Pulmonary neg shortness of breath, neg COPD, neg recent URI, former smoker,           Cardiovascular (-) angina(-) Past MI negative cardio ROS  (-) dysrhythmias      Neuro/Psych negative neurological ROS  negative psych ROS   GI/Hepatic Neg liver ROS, H/o SBO   Endo/Other  negative endocrine ROS  Renal/GU negative Renal ROS  negative genitourinary   Musculoskeletal   Abdominal   Peds  Hematology negative hematology ROS (+)   Anesthesia Other Findings Past Medical History: No date: Collagen vascular disease (HCC) No date: Diverticulitis No date: Small bowel obstruction ([facility]CC)  Past Surgical History: No date: arm surgery No date: CHOLECYSTECTOMY No date: COLON RESECTION No date: COLON SURGERY 01/16/2019: HEMORRHOID SURGERY; N/A     Comment:  Procedure: HEMORRHOIDECTOMY;  Surgeon: Jules Husbands,               MD;  Location: [facility] ORS;  Service: General;  Laterality:               N/A; 2011: ileostomy takedown 2011: loop ileostomy and repair of anastomotic leak at sigmoid colon  BMI    Body Mass Index: 24.87 kg/m      Reproductive/Obstetrics negative OB ROS                            Anesthesia Physical Anesthesia Plan  ASA: II  Anesthesia Plan: General   Post-op Pain Management:    Induction:   PONV Risk Score and Plan: Propofol infusion and TIVA  Airway Management Planned: Natural Airway and Nasal Cannula  Additional Equipment:   Intra-op Plan:   Post-operative Plan:   Informed  Consent: I have reviewed the patients History and Physical, chart, labs and discussed the procedure including the risks, benefits and alternatives for the proposed anesthesia with the patient or authorized representative who has indicated his/her understanding and acceptance.     Dental Advisory Given  Plan Discussed with: Anesthesiologist  Anesthesia Plan Comments:         Anesthesia Quick Evaluation

## 2019-03-05 NOTE — Op Note (Signed)
[facility] Gastroenterology Patient Name: [name] Procedure Date: 03/05/2019 11:14 AM MRN: [id number] Account #: [id number] Date of Birth: September 19, 1970 Admit Type: Outpatient Age: 48 Room: [facility] ENDO ROOM 3 Gender: Male Note Status: Finalized Procedure:             Colonoscopy Indications:           Screening for colorectal malignant neoplasm Providers:             Lin Landsman MD, MD Medicines:             Monitored Anesthesia Care Complications:         No immediate complications. Estimated blood loss: None. Procedure:             Pre-Anesthesia Assessment:                        - Prior to the procedure, a History and Physical was                         performed, and patient medications and allergies were                         reviewed. The patient is competent. The risks and                         benefits of the procedure and the sedation options and                         risks were discussed with the patient. All questions                         were answered and informed consent was obtained.                         Patient identification and proposed procedure were                         verified by the physician, the nurse, the                         anesthesiologist, the anesthetist and the technician                         in the pre-procedure area in the procedure room in the                         endoscopy suite. Mental Status Examination: alert and                         oriented. Airway Examination: normal oropharyngeal                         airway and neck mobility. Respiratory Examination:                         clear to auscultation. CV Examination: normal.                         Prophylactic Antibiotics: The patient does not  require                         prophylactic antibiotics. Prior Anticoagulants: The                         patient has taken no previous anticoagulant or                         antiplatelet agents.  ASA Grade Assessment: II - A                         patient with mild systemic disease. After reviewing                         the risks and benefits, the patient was deemed in                         satisfactory condition to undergo the procedure. The                         anesthesia plan was to use monitored anesthesia care                         (MAC). Immediately prior to administration of                         medications, the patient was re-assessed for adequacy                         to receive sedatives. The heart rate, respiratory                         rate, oxygen saturations, blood pressure, adequacy of                         pulmonary ventilation, and response to care were                         monitored throughout the procedure. The physical                         status of the patient was re-assessed after the                         procedure.                        After obtaining informed consent, the colonoscope was                         passed under direct vision. Throughout the procedure,                         the patient's blood pressure, pulse, and oxygen                         saturations were monitored continuously. The  Colonoscope was introduced through the anus and                         advanced to the the terminal ileum, with                         identification of the appendiceal orifice and IC                         valve. The colonoscopy was performed without                         difficulty. The patient tolerated the procedure well.                         The quality of the bowel preparation was evaluated                         using the BBPS [facility] Bowel Preparation Scale) with                         scores of: Right Colon = 3, Transverse Colon = 3 and                         Left Colon = 3 (entire mucosa seen well with no                         residual staining, small fragments of stool or opaque                          liquid). The total BBPS score equals 9. Findings:      The perianal and digital rectal examinations were normal. Pertinent       negatives include normal sphincter tone and no palpable rectal lesions.      The terminal ileum appeared normal.      There was evidence of a prior functional end-to-end colo-colonic       anastomosis in the sigmoid colon. This was patent and was characterized       by healthy appearing mucosa. The anastomosis was traversed.      Normal mucosa was found in the entire colon.      Non-bleeding internal hemorrhoids were found during retroflexion. The       hemorrhoids were large. Impression:            - The examined portion of the ileum was normal.                        - Patent functional end-to-end colo-colonic                         anastomosis, characterized by healthy appearing mucosa.                        - Normal mucosa in the entire examined colon.                        - Non-bleeding internal hemorrhoids.                        -  No specimens collected. Recommendation:        - Discharge patient to home (with escort).                        - Resume previous diet today.                        - Continue present medications.                        - Repeat colonoscopy in 10 years for surveillance.                        - Return to my office as previously scheduled. Procedure Code(s):     --- Professional ---                        M1470, Colorectal cancer screening; colonoscopy on                         individual not meeting criteria for high risk Diagnosis Code(s):     --- Professional ---                        Z12.11, Encounter for screening for malignant neoplasm                         of colon                        K64.8, Other hemorrhoids                        Z98.0, Intestinal bypass and anastomosis status CPT copyright 2019 American Medical Association. All rights reserved. The codes documented in this report are  preliminary and upon coder review may  be revised to meet current compliance requirements. Dr. Ulyess Mort Lin Landsman MD, MD 03/05/2019 12:18:28 PM This report has been signed electronically. Number of Addenda: 0 Note Initiated On: 03/05/2019 11:14 AM Scope Withdrawal Time: 0 hours 9 minutes 14 seconds  Total Procedure Duration: 0 hours 11 minutes 2 seconds  Estimated Blood Loss:  Estimated blood loss: none.      [facility]

## 2019-03-05 NOTE — Anesthesia Post-op Follow-up Note (Signed)
Anesthesia QCDR form completed.        

## 2019-03-05 NOTE — Op Note (Signed)
[facility] Gastroenterology Patient Name: [name] Procedure Date: 03/05/2019 11:14 AM MRN: [id number] Account #: [id number] Date of Birth: 1970-08-18 Admit Type: Outpatient Age: 48 Room: [facility] ENDO ROOM 3 Gender: Male Note Status: Finalized Procedure:             Upper GI endoscopy Indications:           Dyspepsia, Heartburn Providers:             Lin Landsman MD, MD Referring MD:          Lorelee Market (Referring MD) Medicines:             Monitored Anesthesia Care Complications:         No immediate complications. Estimated blood loss: None. Procedure:             Pre-Anesthesia Assessment:                        - Prior to the procedure, a History and Physical was                         performed, and patient medications and allergies were                         reviewed. The patient is competent. The risks and                         benefits of the procedure and the sedation options and                         risks were discussed with the patient. All questions                         were answered and informed consent was obtained.                         Patient identification and proposed procedure were                         verified by the physician, the nurse, the                         anesthesiologist, the anesthetist and the technician                         in the pre-procedure area in the procedure room in the                         endoscopy suite. Mental Status Examination: alert and                         oriented. Airway Examination: normal oropharyngeal                         airway and neck mobility. Respiratory Examination:                         clear to auscultation. CV Examination: normal.  Prophylactic Antibiotics: The patient does not require                         prophylactic antibiotics. Prior Anticoagulants: The                         patient has taken no previous anticoagulant or                       antiplatelet agents. ASA Grade Assessment: II - A                         patient with mild systemic disease. After reviewing                         the risks and benefits, the patient was deemed in                         satisfactory condition to undergo the procedure. The                         anesthesia plan was to use monitored anesthesia care                         (MAC). Immediately prior to administration of                         medications, the patient was re-assessed for adequacy                         to receive sedatives. The heart rate, respiratory                         rate, oxygen saturations, blood pressure, adequacy of                         pulmonary ventilation, and response to care were                         monitored throughout the procedure. The physical                         status of the patient was re-assessed after the                         procedure.                        After obtaining informed consent, the endoscope was                         passed under direct vision. Throughout the procedure,                         the patient's blood pressure, pulse, and oxygen                         saturations were monitored continuously. The Endoscope  was introduced through the mouth, and advanced to the                         second part of duodenum. The upper GI endoscopy was                         accomplished without difficulty. The patient tolerated                         the procedure well. Findings:      The duodenal bulb and second portion of the duodenum were normal.       Biopsies for histology were taken with a cold forceps for evaluation of       celiac disease.      A benign-appearing, intrinsic mild stenosis was found at the pylorus.       This was traversed. A TTS dilator was passed through the scope. Dilation       with a 12-31-10 mm and a 12-13.5-15 mm pyloric balloon dilator was        performed to 30m. The dilation site was examined following endoscope       reinsertion and showed complete resolution of luminal narrowing.       Estimated blood loss was minimal.      A small hiatal hernia was present.      The entire examined stomach was normal. Biopsies were taken with a cold       forceps for Helicobacter pylori testing.      The cardia and gastric fundus were normal on retroflexion.      Esophagogastric landmarks were identified: the gastroesophageal junction       was found at 39 cm from the incisors.      A widely patent Schatzki ring was found at the gastroesophageal junction.      LA Grade A (one or more mucosal breaks less than 5 mm, not extending       between tops of 2 mucosal folds) esophagitis with no bleeding was found       at the gastroesophageal junction.      The examined esophagus was normal. Biopsies were taken with a cold       forceps for histology. Impression:            - Normal duodenal bulb and second portion of the                         duodenum. Biopsied.                        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                        - Small hiatal hernia.                        - Normal stomach. Biopsied.                        - Esophagogastric landmarks identified.                        - Widely patent Schatzki ring.                        -  LA Grade A reflux esophagitis with no bleeding.                        - Normal esophagus. Biopsied. Recommendation:        - Await pathology results.                        - Follow an antireflux regimen.                        - Use a proton pump inhibitor PO BID.                        - Return to my office as previously scheduled. Procedure Code(s):     --- Professional ---                        [phone], Esophagogastroduodenoscopy, flexible,                         transoral; with dilation of gastric/duodenal                         stricture(s) (eg, balloon, bougie)                         43239, 78, Esophagogastroduodenoscopy, flexible,                         transoral; with biopsy, single or multiple Diagnosis Code(s):     --- Professional ---                        K31.1, Adult hypertrophic pyloric stenosis                        K44.9, Diaphragmatic hernia without obstruction or                         gangrene                        K22.2, Esophageal obstruction                        K21.00, Gastro-esophageal reflux disease with                         esophagitis, without bleeding                        R10.13, Epigastric pain                        R12, Heartburn CPT copyright 2019 American Medical Association. All rights reserved. The codes documented in this report are preliminary and upon coder review may  be revised to meet current compliance requirements. Dr. Ulyess Mort Lin Landsman MD, MD 03/05/2019 12:03:01 PM This report has been signed electronically. Number of Addenda: 0 Note Initiated On: 03/05/2019 11:14 AM Estimated Blood Loss:  Estimated blood loss: none.      [facility]

## 2019-03-05 NOTE — Anesthesia Procedure Notes (Signed)
Date/Time: 03/05/2019 11:40 AM Performed by: Allean Found, CRNA Pre-anesthesia Checklist: Patient identified, Emergency Drugs available, Suction available, Patient being monitored and Timeout performed Patient Re-evaluated:Patient Re-evaluated prior to induction Oxygen Delivery Method: Nasal cannula Placement Confirmation: positive ETCO2

## 2019-03-05 NOTE — Transfer of Care (Signed)
Immediate Anesthesia Transfer of Care Note  Patient: [name]  Procedure(s) Performed: COLONOSCOPY WITH PROPOFOL (N/A ) ESOPHAGOGASTRODUODENOSCOPY (EGD) WITH PROPOFOL (N/A )  Patient Location: PACU  Anesthesia Type:General  Level of Consciousness: sedated  Airway & Oxygen Therapy: Patient Spontanous Breathing and Patient connected to nasal cannula oxygen  Post-op Assessment: Report given to RN and Post -op Vital signs reviewed and stable  Post vital signs: Reviewed and stable  Last Vitals:  Vitals Value Taken Time  BP 98/66 03/05/19 1228  Temp 36.1 C 03/05/19 1228  Pulse 94 03/05/19 1233  Resp 19 03/05/19 1233  SpO2 100 % 03/05/19 1233  Vitals shown include unvalidated device data.  Last Pain:  Vitals:   03/05/19 1228  TempSrc: Temporal  PainSc: Asleep         Complications: No apparent anesthesia complications

## 2019-03-06 ENCOUNTER — Encounter: Payer: Self-pay | Admitting: *Deleted

## 2019-03-06 LAB — SURGICAL PATHOLOGY

## 2019-03-06 NOTE — Anesthesia Postprocedure Evaluation (Signed)
Anesthesia Post Note  Patient: [name]  Procedure(s) Performed: COLONOSCOPY WITH PROPOFOL (N/A ) ESOPHAGOGASTRODUODENOSCOPY (EGD) WITH PROPOFOL (N/A )  Patient location during evaluation: PACU Anesthesia Type: General Level of consciousness: awake and alert Pain management: pain level controlled Vital Signs Assessment: post-procedure vital signs reviewed and stable Respiratory status: spontaneous breathing, nonlabored ventilation and respiratory function stable Cardiovascular status: blood pressure returned to baseline and stable Postop Assessment: no apparent nausea or vomiting Anesthetic complications: no     Last Vitals:  Vitals:   03/05/19 1248 03/05/19 1258  BP: 123/89 131/85  Pulse: 74 61  Resp: 10 10  Temp:    SpO2: 100% 100%    Last Pain:  Vitals:   03/06/19 0809  TempSrc:   PainSc: 0-No pain                 Durenda Hurt

## 2019-03-07 ENCOUNTER — Ambulatory Visit (INDEPENDENT_AMBULATORY_CARE_PROVIDER_SITE_OTHER): Payer: 59 | Admitting: Surgery

## 2019-03-07 ENCOUNTER — Encounter: Payer: Self-pay | Admitting: Surgery

## 2019-03-07 ENCOUNTER — Other Ambulatory Visit: Payer: Self-pay

## 2019-03-07 VITALS — BP 136/67 | HR 68 | Temp 97.7°F | Ht 72.0 in | Wt 181.2 lb

## 2019-03-07 DIAGNOSIS — K4090 Unilateral inguinal hernia, without obstruction or gangrene, not specified as recurrent: Secondary | ICD-10-CM | POA: Diagnosis not present

## 2019-03-07 NOTE — Patient Instructions (Signed)
Our surgery scheduler Angie will contact you within the next 24-48 hours to discuss surgery date and the prep. Please have the BLUE sheet available when Angie contacts you. If you have any questions or concerns, please feel free to contact our office.   Hernia, Adult     A hernia is the bulging of an organ or tissue through a weak spot in the muscles of the abdomen (abdominal wall). Hernias develop most often near the belly button (navel) or the area where the leg meets the lower abdomen (groin). Common types of hernias include:  Incisional hernia. This type bulges through a scar from an abdominal surgery.  Umbilical hernia. This type develops near the navel.  Inguinal hernia. This type develops in the groin or scrotum.  Femoral hernia. This type develops under the groin, in the upper thigh area.  Hiatal hernia. This type occurs when part of the stomach slides above the muscle that separates the abdomen from the chest (diaphragm). What are the causes? This condition may be caused by:  Heavy lifting.  Coughing over a long period of time.  Straining to have a bowel movement. Constipation can lead to straining.  An incision made during an abdominal surgery.  A physical problem that is present at birth (congenital defect).  Being overweight or obese.  Smoking.  Excess fluid in the abdomen.  Undescended testicles in males. What are the signs or symptoms? The main symptom is a skin-colored, rounded bulge in the area of the hernia. However, a bulge may not always be present. It may grow bigger or be more visible when you cough or strain (such as when lifting something heavy). A hernia that can be pushed back into the area (is reducible) rarely causes pain. A hernia that cannot be pushed back into the area (is incarcerated) may lose its blood supply (become strangulated). A hernia that is incarcerated may cause:  Pain.  Fever.  Nausea and  vomiting.  Swelling.  Constipation. How is this diagnosed? A hernia may be diagnosed based on:  Your symptoms and medical history.  A physical exam. Your health care provider may ask you to cough or move in certain ways to see if the hernia becomes visible.  Imaging tests, such as: ? X-rays. ? Ultrasound. ? CT scan. How is this treated? A hernia that is small and painless may not need to be treated. A hernia that is large or painful may be treated with surgery. Inguinal hernias may be treated with surgery to prevent incarceration or strangulation. Strangulated hernias are always treated with surgery because a lack of blood supply to the trapped organ or tissue can cause it to die. Surgery to treat a hernia involves pushing the bulge back into place and repairing the weak area of the muscle or abdominal wall. Follow these instructions at home: Activity  Avoid straining.  Do not lift anything that is heavier than 10 lb (4.5 kg), or the limit that you are told, until your health care provider says that it is safe.  When lifting heavy objects, lift with your leg muscles, not your back muscles. Preventing constipation  Take actions to prevent constipation. Constipation leads to straining with bowel movements, which can make a hernia worse or cause a hernia repair to break down. Your health care provider may recommend that you: ? Drink enough fluid to keep your urine pale yellow. ? Eat foods that are high in fiber, such as fresh fruits and vegetables, whole grains, and beans. ?  Limit foods that are high in fat and processed sugars, such as fried or sweet foods. ? Take an over-the-counter or prescription medicine for constipation. General instructions  When coughing, try to cough gently.  You may try to push the hernia back in place by very gently pressing on it while lying down. Do not try to force the bulge back in if it will not push in easily.  If you are overweight, work with  your health care provider to lose weight safely.  Do not use any products that contain nicotine or tobacco, such as cigarettes and e-cigarettes. If you need help quitting, ask your health care provider.  If you are scheduled for hernia repair, watch your hernia for any changes in shape, size, or color. Tell your health care provider about any changes or new symptoms.  Take over-the-counter and prescription medicines only as told by your health care provider.  Keep all follow-up visits as told by your health care provider. This is important. Contact a health care provider if:  You develop new pain, swelling, or redness around your hernia.  You have signs of constipation, such as: ? Fewer bowel movements in a week than normal. ? Difficulty having a bowel movement. ? Stools that are dry, hard, or larger than normal. Get help right away if:  You have a fever.  You have abdomen pain that gets worse.  You feel nauseous or you vomit.  You cannot push the hernia back in place by very gently pressing on it while lying down. Do not try to force the bulge back in if it will not push in easily.  The hernia: ? Changes in shape, size, or color. ? Feels hard or tender. These symptoms may represent a serious problem that is an emergency. Do not wait to see if the symptoms will go away. Get medical help right away. Call your local emergency services (911 in the [place] Summary  A hernia is the bulging of an organ or tissue through a weak spot in the muscles of the abdomen (abdominal wall).  The main symptom is a skin-colored, rounded lump (bulge) in the hernia area. However, a bulge may not always be present. It may grow bigger or more visible when you cough or strain (such as when having a bowel movement).  A hernia that is small and painless may not need to be treated. A hernia that is large or painful may be treated with surgery.  Surgery to treat a hernia involves pushing the bulge back into  place and repairing the weak part of the abdomen. This information is not intended to replace advice given to you by your health care provider. Make sure you discuss any questions you have with your health care provider. Document Released: 03/08/2005 Document Revised: 06/29/2018 Document Reviewed: 12/08/2016 Elsevier Patient Education  2020 [employer].

## 2019-03-08 ENCOUNTER — Encounter: Payer: Self-pay | Admitting: Surgery

## 2019-03-08 NOTE — Progress Notes (Signed)
Outpatient Surgical Follow Up  03/08/2019  [name] is an 48 y.o. male.   Chief Complaint  Patient presents with  . Routine Post Op    Follow up - discuss CT scan 02/12/19 - [name]    HPI:  [name] had a recent open her hemorrhoidectomy for incarcerated hemorrhoids.  He has done well.   He is now following up after CT thst  I have personally reviewed there is significant adhesions from the small bowel to the abdominal wall and it seems to be very hostile abdomen, he does have bilateral inguinal hernias.. He has had 5 major abdominal operations to include cholecystectomy, colon resection and small bowel resection LOA.  Last operation was several years ago.  He does have chronic abdominal pain related to small ventral hernia.  He denies any fevers any chills. He is most pressing concern if these chronic left inguinal pain, describes it as intermittent moderate intensity and sharp.  He also describes a heaviness sensation when he leans forward in his lower abdomen.  He is concerned given all his previous abdominal operations and hostile abdomen.  Past Medical History:  Diagnosis Date  . Collagen vascular disease (HCC)   . Diverticulitis   . Small bowel obstruction [facility]     Past Surgical History:  Procedure Laterality Date  . arm surgery    . CHOLECYSTECTOMY    . COLON RESECTION    . COLON SURGERY    . COLONOSCOPY WITH PROPOFOL N/A 03/05/2019   Procedure: COLONOSCOPY WITH PROPOFOL;  Surgeon: Toney Reil, MD;  Location: [facility] ENDOSCOPY;  Service: Gastroenterology;  Laterality: N/A;  . ESOPHAGOGASTRODUODENOSCOPY (EGD) WITH PROPOFOL N/A 03/05/2019   Procedure: ESOPHAGOGASTRODUODENOSCOPY (EGD) WITH PROPOFOL;  Surgeon: Toney Reil, MD;  Location: [facility] ENDOSCOPY;  Service: Gastroenterology;  Laterality: N/A;  . HEMORRHOID SURGERY N/A 01/16/2019   Procedure: HEMORRHOIDECTOMY;  Surgeon: Leafy Ro, MD;  Location: [facility] ORS;  Service: General;  Laterality: N/A;  . ileostomy  takedown  2011  . loop ileostomy and repair of anastomotic leak at sigmoid colon  2011    History reviewed. No pertinent family history.  Social History:  reports that he quit smoking about 3 years ago. He has never used smokeless tobacco. He reports that he does not drink alcohol or use drugs.  Allergies: No Known Allergies  Medications reviewed.    ROS Full ROS performed and is otherwise negative other than what is stated in HPI   BP 136/67   Pulse 68   Temp 97.7 F (36.5 C) (Temporal)   Ht 6' (1.829 m)   Wt 181 lb 3.2 oz (82.2 kg)   SpO2 99%   BMI 24.58 kg/m   Physical Exam Vitals and nursing note reviewed. Exam conducted with a chaperone present.  Constitutional:      Appearance: Normal appearance. He is normal weight.  Cardiovascular:     Rate and Rhythm: Normal rate and regular rhythm.     Heart sounds: No murmur.  Pulmonary:     Effort: Pulmonary effort is normal. No respiratory distress.     Breath sounds: Normal breath sounds. No stridor. No wheezing.  Abdominal:     General: Abdomen is flat. There is no distension.     Palpations: There is no mass.     Tenderness: There is no abdominal tenderness. There is no guarding or rebound.     Hernia: A hernia is present.     Comments: There is evidence of midline laparotomy scar.  It is a large scar likely allow to heal by secondary intention.  There is evidence of bilateral inguinal hernia with the left one being tender but reducible.  No peritonitis  Musculoskeletal:        General: No swelling. Normal range of motion.     Cervical back: Normal range of motion and neck supple.  Skin:    General: Skin is warm and dry.     Capillary Refill: Capillary refill takes less than 2 seconds.  Neurological:     General: No focal deficit present.     Mental Status: He is alert and oriented to person, place, and time.  Psychiatric:        Mood and Affect: Mood normal.        Behavior: Behavior normal.        Thought  Content: Thought content normal.        Judgment: Judgment normal.         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  I had an extensive discussion with the patient regarding options of robotic bilateral inguinal hernia repair versus open repair.  I did think that while and went back and forth to review his CT scan as well as his abdominal findings.  Given that I do think that his abdomen is hostile this is a case that we might be better off avoiding any intra-abdominal procedures and is concentrated on the left inguinal area.  After an extensive discussion with the patient he and I agreed that probably the safest option would be to perform an open left inguinal hernia repair.  He is not symptomatic from the right inguinal hernia and if that becomes a problem we can address it at some other time.  Procedure discussed with the patient in detail.  Risk benefit and possible complications including but not limited to: Chronic pain, recurrence, infection and mesh problems.  He knows that the most important predictor indicator for chronic pain is having preoperative pain which he does have.   Greater than 50% of the 40 minutes  visit was spent in counseling/coordination of care   Caroleen Hamman, MD [place] [facility]

## 2019-03-13 ENCOUNTER — Telehealth: Payer: Self-pay | Admitting: Surgery

## 2019-03-13 NOTE — Telephone Encounter (Signed)
Patient is calling and is asking for one of the nurses to give him a call soon as they get a chance he can be reached at [phone]. Please call and advise.

## 2019-03-13 NOTE — Telephone Encounter (Signed)
Spoke with patient to gather more information. Patient states he was recently seen by Dr.Vanga and she informed patient that there was a 3rd hernia that she seen on scan. Patient has discussed two previous hernias with Dr.Pabon and is scheduled for surgery the 7th of January. Patient states he is experiencing pain from the 3 hernia. Patient is scheduled with Dr.Pabon 03/14/19 at 9:00am. Patient verbalizes understanding.

## 2019-03-14 ENCOUNTER — Ambulatory Visit (INDEPENDENT_AMBULATORY_CARE_PROVIDER_SITE_OTHER): Payer: 59 | Admitting: Surgery

## 2019-03-14 ENCOUNTER — Encounter: Payer: Self-pay | Admitting: Surgery

## 2019-03-14 ENCOUNTER — Telehealth: Payer: Self-pay | Admitting: Surgery

## 2019-03-14 ENCOUNTER — Other Ambulatory Visit: Payer: Self-pay

## 2019-03-14 VITALS — BP 147/85 | HR 61 | Temp 97.2°F | Resp 12 | Ht 72.0 in | Wt 184.2 lb

## 2019-03-14 DIAGNOSIS — R109 Unspecified abdominal pain: Secondary | ICD-10-CM | POA: Diagnosis not present

## 2019-03-14 NOTE — Telephone Encounter (Signed)
Pt has been advised of pre admission date/time, Covid Testing date and Surgery date.  Surgery Date: 03/29/19 with Dr Carolin Coy left inguinal hernia repair.  Preadmission Testing Date: 03/20/19 between 8-1:00pm-phone interview.  Covid Testing Date: 03/27/19 between 8-10:30am - patient advised to go to the [facility]  [employer] Video sent via [employer] [facility] and [employer].  Patient has been made aware to call [phone], between 1-3:00pm the day before surgery, to find out what time to arrive.

## 2019-03-14 NOTE — Patient Instructions (Addendum)
As planned, we will continue with your Hernia Repair on 03/29/2019.  Please take Ibuprofen and Tylenol to help ease the pain. You can try cold compresses to the area as well.  Please call the office if you have any questions or concerns.

## 2019-03-14 NOTE — Progress Notes (Signed)
[name] is a 48 year old male well-known to me with bilateral inguinal hernias with a symptomatic left and a small ventral hernia.  Now he is comes back for epigastric pain he thought was related to hiatal hernia.  I personally reviewed the EGDs and the hernia is very small CT scan shows a minimal hiatal hernia.  Had extensive discussion with the patient regarding situation.  Do not think that this is coming from hiatal hernia and he is tender in the epigastric area likely from intercostal neuralgia or costochondritis of the sternum or chest wall.  We will keep the plan for open left inguinal hernia repair in the elective set up.  No need for urgent surgical intervention.  Is not that I spent at least 25 minutes in this encounter with greater than 50% spent in coordination and counseling of his care

## 2019-03-20 ENCOUNTER — Other Ambulatory Visit: Payer: Self-pay

## 2019-03-20 ENCOUNTER — Encounter: Payer: Self-pay | Admitting: Surgery

## 2019-03-20 ENCOUNTER — Other Ambulatory Visit
Admission: RE | Admit: 2019-03-20 | Discharge: 2019-03-20 | Disposition: A | Discharge status: Home / Self Care | Payer: 59 | Source: Ambulatory Visit | Attending: Surgery | Admitting: Surgery

## 2019-03-20 NOTE — Patient Instructions (Signed)
INSTRUCTIONS FOR SURGERY     Your surgery is scheduled for:   Thursday, January 7TH     To find out your arrival time for the day of surgery,          please call [phone] between 1 pm and 3 pm on :   Wednesday, January 6TH     When you arrive for surgery, report to the SECOND FLOOR OF THE MEDICAL MALL.       Do NOT stop on the first floor to register.    REMEMBER: Instructions that are not followed completely may result in serious medical risk,  up to and including death, or upon the discretion of your surgeon and anesthesiologist,            your surgery may need to be rescheduled.  __X__ 1. Do not eat food after midnight the night before your procedure.                    No gum, candy, lozenger, tic tacs, tums or hard candies.                  ABSOLUTELY NOTHING SOLID IN YOUR MOUTH AFTER MIDNIGHT                    You may drink unlimited clear liquids up to 2 hours before you are scheduled to arrive for surgery.                   Do not drink anything within those 2 hours unless you need to take medicine, then take the                   smallest amount you need.  Clear liquids include:  water, apple juice without pulp,                   any flavor Gatorade, Black coffee, black tea.  Sugar may be added but no dairy/ honey /lemon.                        Broth and jello is not considered a clear liquid.  __x__  2. On the morning of surgery, please brush your teeth with toothpaste and water. You may rinse with                  mouthwash if you wish but DO NOT SWALLOW TOOTHPASTE OR MOUTHWASH  __X___3. NO alcohol for 24 hours before or after surgery.  __x___ 4.  Do NOT smoke or use e-cigarettes for 24 HOURS PRIOR TO SURGERY.                      DO NOT Use any chewable tobacco products for at least 6 hours prior to surgery.  __x___ 5. If you start any new medication after this appointment and prior to surgery, please           Bring it with you on the day of surgery.  ___x__ 6. Notify your doctor if there is any change in your medical condition, such as fever, infection,  vomitting, diarrhea or any open sores.  __x___ 7.  USE the CHG SOAP as instructed, the night before surgery and the day of surgery.                   Once you have washed with this soap, do NOT use any of the following: Powders, perfumes                    or lotions. Please do not wear make up, hairpins, clips or nail polish. You MAY wear deodorant.                   Men may shave their face and neck.  Women need to shave 48 hours prior to surgery.                   DO NOT wear ANY jewelry on the day of surgery. If there are rings that are too tight to                    remove easily, please address this prior to the surgery day. Piercings need to be removed.                                                                     NO METAL ON YOUR BODY.                    Do NOT bring any valuables.  If you came to Pre-Admit testing then you will not need license,                     insurance card or credit card.  If you will be staying overnight, please either leave your things in                     the car or have your family be responsible for these items.                     [facility] IS NOT RESPONSIBLE FOR BELONGINGS OR VALUABLES.  ___X__ 8. DO NOT wear contact lenses on surgery day.  You may not have dentures,                     Hearing aides, contacts or glasses in the operating room. These items can be                    Placed in the Recovery Room to receive immediately after surgery.  __x___ 9. IF YOU ARE SCHEDULED TO GO HOME ON THE SAME DAY, YOU MUST                   Have someone to drive you home and to stay with you  for the first 24 hours.                    Have an arrangement prior to arriving on surgery day.  ___x__ 10. Take the following medications on the morning of surgery with a sip of water:  1.  omeprazole                     2.                     3. . _____ 11.  Follow any instructions provided to you by your surgeon.                        Such as enema, clear liquid bowel prep  __X__  12. STOP ASPIRIN AS OF: today, December 29th, 2020                       THIS INCLUDES BC POWDERS / GOODIES POWDER  __x___ 13. STOP Anti-inflammatories as of:  Today, December 29TH                      This includes IBUPROFEN / MOTRIN / ADVIL / ALEVE/ NAPROXYN                    YOU MAY TAKE TYLENOL ANY TIME PRIOR TO SURGERY.  __X__ 14.  You may continue taking Vitamin B12 / Vitamin D3 but do not take on the morning of surgery.  __X____18.  Wear clean and comfortable clothing to the hospital.  If you need to take Bentyl for abdominal spasms, you may do so.  Do not take on the     Morning of surgery. Have contact phone numbers if you are being dropped off at the hospital.

## 2019-03-27 ENCOUNTER — Other Ambulatory Visit
Admission: RE | Admit: 2019-03-27 | Discharge: 2019-03-27 | Disposition: A | Discharge status: Home / Self Care | Payer: 59 | Source: Ambulatory Visit | Attending: Surgery | Admitting: Surgery

## 2019-03-27 ENCOUNTER — Other Ambulatory Visit: Payer: Self-pay

## 2019-03-27 DIAGNOSIS — Z01812 Encounter for preprocedural laboratory examination: Secondary | ICD-10-CM | POA: Insufficient documentation

## 2019-03-27 DIAGNOSIS — Z20822 Contact with and (suspected) exposure to covid-19: Secondary | ICD-10-CM | POA: Insufficient documentation

## 2019-03-27 LAB — BASIC METABOLIC PANEL
Anion gap: 8 (ref 5–15)
BUN: 12 mg/dL (ref 6–20)
CO2: 29 mmol/L (ref 22–32)
Calcium: 9.4 mg/dL (ref 8.9–10.3)
Chloride: 104 mmol/L (ref 98–111)
Creatinine, Ser: 1.31 mg/dL — ABNORMAL HIGH (ref 0.61–1.24)
GFR calc Af Amer: >60 mL/min (ref >60)
GFR calc non Af Amer: >60 mL/min (ref >60)
Glucose, Bld: 109 mg/dL — ABNORMAL HIGH (ref 70–99)
Potassium: 4 mmol/L (ref 3.5–5.1)
Sodium: 141 mmol/L (ref 135–145)

## 2019-03-27 LAB — SARS CORONAVIRUS 2 (TAT 6-24 HRS): SARS Coronavirus 2: NEGATIVE

## 2019-03-27 LAB — CBC
HCT: 42.6 % (ref 39.0–52.0)
Hemoglobin: 13.4 g/dL (ref 13.0–17.0)
MCH: 26.7 pg (ref 26.0–34.0)
MCHC: 31.5 g/dL (ref 30.0–36.0)
MCV: 84.9 fL (ref 80.0–100.0)
Platelets: 354 10*3/uL (ref 150–400)
RBC: 5.02 MIL/uL (ref 4.22–5.81)
RDW: 13.9 % (ref 11.5–15.5)
WBC: 6.7 10*3/uL (ref 4.0–10.5)
nRBC: 0 % (ref 0.0–0.2)

## 2019-03-28 ENCOUNTER — Telehealth: Payer: Self-pay

## 2019-03-28 ENCOUNTER — Ambulatory Visit: Payer: 59 | Admitting: Gastroenterology

## 2019-03-28 NOTE — Telephone Encounter (Signed)
Patient called and he has had a Covid exposure at his work place. He would like to cancel his surgery scheduled for 03/29/19. He will call us back once he knows when he would be able to have surgery. He thinks this will be sometime next month. The OR has been notified of cancellation and the [place].

## 2019-03-29 ENCOUNTER — Ambulatory Visit: Admission: RE | Admit: 2019-03-29 | Payer: 59 | Source: Home / Self Care | Admitting: Surgery

## 2019-03-29 HISTORY — DX: Personal history of other diseases of the digestive system: Z87.19

## 2019-03-29 SURGERY — REPAIR, HERNIA, INGUINAL, ADULT
Anesthesia: General | Laterality: Left

## 2019-03-30 ENCOUNTER — Telehealth: Payer: Self-pay | Admitting: Gastroenterology

## 2019-03-30 NOTE — Telephone Encounter (Signed)
Patient called here for refill in error meaning to call the pharmacy.

## 2019-04-01 ENCOUNTER — Other Ambulatory Visit: Payer: Self-pay | Admitting: Gastroenterology

## 2019-04-09 ENCOUNTER — Encounter: Payer: Self-pay | Admitting: Gastroenterology

## 2019-04-09 ENCOUNTER — Ambulatory Visit (INDEPENDENT_AMBULATORY_CARE_PROVIDER_SITE_OTHER): Payer: 59 | Admitting: Gastroenterology

## 2019-04-09 ENCOUNTER — Other Ambulatory Visit: Payer: Self-pay

## 2019-04-09 VITALS — BP 142/96 | HR 69 | Temp 98.4°F | Resp 16 | Ht 72.0 in | Wt 185.4 lb

## 2019-04-09 DIAGNOSIS — K219 Gastro-esophageal reflux disease without esophagitis: Secondary | ICD-10-CM

## 2019-04-09 DIAGNOSIS — R140 Abdominal distension (gaseous): Secondary | ICD-10-CM

## 2019-04-09 DIAGNOSIS — R109 Unspecified abdominal pain: Secondary | ICD-10-CM

## 2019-04-09 DIAGNOSIS — E559 Vitamin D deficiency, unspecified: Secondary | ICD-10-CM

## 2019-04-09 DIAGNOSIS — R1084 Generalized abdominal pain: Secondary | ICD-10-CM

## 2019-04-09 MED ORDER — OMEPRAZOLE 40 MG PO CPDR: 40.0000 mg | DELAYED_RELEASE_CAPSULE | Freq: Every day | ORAL | 1 refills | Status: DC

## 2019-04-09 MED ORDER — DICYCLOMINE HCL 20 MG PO TABS: 20.0000 mg | ORAL_TABLET | Freq: Three times a day (TID) | ORAL | 2 refills | Status: DC

## 2019-04-09 NOTE — Progress Notes (Addendum)
[name], MD [address]  [address]  [place], [place] [address]  Main: [phone]  Fax: [phone]     Gastroenterology Consultation  Referring Provider:     [name], MD Primary Care Physician:  [name], MD Primary Gastroenterologist:  Dr. [name] Reason for Consultation: Abdominal pain, h/o sbo         HPI:   [name] is a 49 y.o. male referred by Dr. [name], MD  for consultation & management of chronic abdominal pain and recurrent partial SBO.  For last 4-5 years, patient has been experiencing recurrent episodes of severe abdominal pain in different locations, associated with abdominal bloating, nausea and vomiting followed by relief of abdominal pain.  He had multiple ER visits as well as several admissions secondary to partial small bowel obstructions ever since he had complicated surgery in 2011.  Most recently, admitted to [facility] on 01/15/2019 secondary to severe anorectal pain, found to have strangulated internal hemorrhoids, underwent hemorrhoidectomy of the left lateral hemorrhoids by Dr. Everlene Farrier.  Patient is referred to [facility] by Dr. Everlene Farrier for further management of abdominal pain and history of partial SBO and to evaluate for possible Crohn's.  Patient is discharged home on dicyclomine and he thinks that the only medication which has helped him so far.  Today, he does not have active abdominal symptoms since admission.  However, he continues to have mild to moderate flareups at least 2-3 times a month.  He has adjusted his diet to minimize his symptoms.  Cannot tolerate high roughage foods at all.  His weight fluctuates but overall stable within a certain range.  He does not have anemia.  He does report fatigue.  His bowel movements fluctuate from 2-3 times a day to 3 to 4 days without a BM associated with significant straining.  He denies rectal bleeding at this time.  He thinks he is recovering well from hemorrhoidectomy.   He denies smoking or alcohol use.  He works for [employer], in [place]  Patient has been followed by a gastroenterologist, Dr. [name] in [place] until recently.  Patient tells me that at some point he was told he has Crohn's disease.  Patient had multiple GI surgeries as listed below Hemorrhoidectomy in 2001 Cholecystectomy in 12/2002 Sigmoid colectomy on 07/19/2009 complicated by anastomotic leak of the sigmoid colon with abscesses and fistula resulting in repair of the anastomotic leak, diverting loop ileostomy and small bowel resection in 09/08/2009 Takedown of loop ileostomy 11/20/2009 Hemorrhoidectomy 01/16/2019  Follow-up visit 04/09/2019 Since last visit, patient underwent EGD and colonoscopy and feels significantly better.  He continues to take Bentyl 20 mg as needed which provides significant relief of his abdominal pain and bloating.  He had severe vitamin D deficiency, currently taking 50,000 units weekly.  He reports that his energy levels have improved, gained few pounds. Patient finished 2 weeks course of rifaximin which he thinks has modestly helped  NSAIDs: None  Antiplts/Anticoagulants/Anti thrombotics: None  GI Procedures: EGD and colonoscopy 03/05/2019  - Normal duodenal bulb and second portion of the duodenum. Biopsied. 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 - Small hiatal hernia. - Normal stomach. Biopsied. - Esophagogastric landmarks identified. - Widely patent Schatzki ring. - LA Grade A reflux esophagitis with no bleeding. - Normal esophagus. Biopsied.  - The examined portion of the ileum was normal. - Patent functional end-to-end colo-colonic anastomosis, characterized by healthy appearing mucosa. - Normal mucosa in the entire examined  colon. - Non-bleeding internal hemorrhoids. - No specimens collected.  DIAGNOSIS:  A. DUODENUM; COLD BIOPSY:  - UNREMARKABLE DUODENAL MUCOSA.  - NEGATIVE FOR FEATURES OF CELIAC DISEASE,  DYSPLASIA, AND MALIGNANCY.   B. STOMACH, RANDOM; COLD BIOPSY:  - ANTRAL MUCOSA WITH FOCAL REACTIVE GASTRITIS.  - UNREMARKABLE OXYNTIC MUCOSA.  - NEGATIVE FOR H. PYLORI, DYSPLASIA, AND MALIGNANCY.   C. ESOPHAGUS; COLD BIOPSY:  - UNREMARKABLE SQUAMOUS MUCOSA.  - NEGATIVE FOR INTRAEPITHELIAL EOSINOPHILS, DYSPLASIA, AND MALIGNANCY.  Colonoscopy 11/11/1999 I. SMALL BOWEL, ILEUM BIOPSY: UNREMARKABLE SMALL BOWEL MUCOSA.  NO VILLOUS ATROPHY, INFLAMMATION OR OTHER ABNORMALITIES PRESENT.   II. COLON, BIOPSY: UNREMARKABLE COLONIC MUCOSA. NO SIGNIFICANT  INFLAMMATION OR OTHER ABNORMALITIES IDENTIFIED.   Colonoscopy 11/29/2000 I. SMALL BOWEL, ILEUM BIOPSY: UNREMARKABLE SMALL BOWEL MUCOSA.  NO VILLOUS ATROPHY, INFLAMMATION OR OTHER ABNORMALITIES PRESENT.   II. COLON, BIOPSY: UNREMARKABLE COLONIC MUCOSA. NO SIGNIFICANT  INFLAMMATION OR OTHER ABNORMALITIES IDENTIFIED.   Colonoscopy 02/28/2004 COLON, BIOPSY: NO ACTIVE COLITIS OR DYSPLASIA IDENTIFIED. SEE  COMMENT (BIOPSIES, RECTOSIGMOID)   COMMENT  There is benign colorectal mucosa in which there is mild to  moderate chronic inflammation within the lamina propria. At the  deep edge of one of the fragments there is a superficial aspect  of a mucosal lymphoid aggregate. This could conceivable account  for the nodularity noted colonoscopically. There is a rare gland  that contains a neutrophil but otherwise no active inflammation  is seen and the findings are non-specific. (JAS:gt) 03/02/04  Past Medical History:  Diagnosis Date  . Anemia    vitamin d deficiency  . Collagen vascular disease (HCC)   . Diverticulitis   . History of hiatal hernia   . Small bowel obstruction [facility])     Past Surgical History:  Procedure Laterality Date  . arm surgery    . CHOLECYSTECTOMY    . COLON RESECTION    . COLON SURGERY  2011   small bowel resection  . COLONOSCOPY WITH PROPOFOL N/A 03/05/2019   Procedure: COLONOSCOPY WITH PROPOFOL;   Surgeon: Toney Reil, MD;  Location: [facility] ENDOSCOPY;  Service: Gastroenterology;  Laterality: N/A;  . ESOPHAGOGASTRODUODENOSCOPY (EGD) WITH PROPOFOL N/A 03/05/2019   Procedure: ESOPHAGOGASTRODUODENOSCOPY (EGD) WITH PROPOFOL;  Surgeon: Toney Reil, MD;  Location: [facility] ENDOSCOPY;  Service: Gastroenterology;  Laterality: N/A;  . HEMORRHOID SURGERY N/A 01/16/2019   Procedure: HEMORRHOIDECTOMY;  Surgeon: Leafy Ro, MD;  Location: [facility] ORS;  Service: General;  Laterality: N/A;  . ileostomy takedown  2011  . loop ileostomy and repair of anastomotic leak at sigmoid colon  2011    Current Outpatient Medications:  .  dicyclomine (BENTYL) 20 MG tablet, Take 1 tablet (20 mg total) by mouth 3 (three) times daily before meals., Disp: 90 tablet, Rfl: 2 .  vitamin B-12 (CYANOCOBALAMIN) 1000 MCG tablet, Take 1,000 mcg by mouth daily., Disp: , Rfl:  .  Vitamin D, Ergocalciferol, (DRISDOL) 1.25 MG (50000 UT) CAPS capsule, Take 1 capsule (50,000 Units total) by mouth every 7 (seven) days for 16 doses. (Patient taking differently: Take 50,000 Units by mouth every Tuesday. ), Disp: 16 capsule, Rfl: 0 .  omeprazole (PRILOSEC) 40 MG capsule, Take 1 capsule (40 mg total) by mouth daily before breakfast., Disp: 90 capsule, Rfl: 1   Family History  Problem Relation Age of Onset  . Heart disease Mother   . Heart disease Father      Social History   Tobacco Use  . Smoking status: Former Smoker    Packs/day: 2.00  Types: Cigarettes    Quit date: 2017    Years since quitting: 4.0  . Smokeless tobacco: Never Used  Substance Use Topics  . Alcohol use: No  . Drug use: No    Allergies as of 04/09/2019  . (No Known Allergies)    Review of Systems:    All systems reviewed and negative except where noted in HPI.   Physical Exam:  BP (!) 142/96 (BP Location: Left Arm, Patient Position: Sitting, Cuff Size: Large)   Pulse 69   Temp 98.4 F (36.9 C)   Resp 16   Ht 6' (1.829 m)   Wt 185  lb 6.4 oz (84.1 kg)   BMI 25.14 kg/m  No LMP for male patient.  General:   Alert,  Well-developed, well-nourished, pleasant and cooperative in NAD Head:  Normocephalic and atraumatic. Eyes:  Sclera clear, no icterus.   Conjunctiva pink. Ears:  Normal auditory acuity. Nose:  No deformity, discharge, or lesions. Mouth:  No deformity or lesions,oropharynx pink & moist. Neck:  Supple; no masses or thyromegaly. Lungs:  Respirations even and unlabored.  Clear throughout to auscultation.   No wheezes, crackles, or rhonchi. No acute distress. Heart:  Regular rate and rhythm; no murmurs, clicks, rubs, or gallops. Abdomen:  Normal bowel sounds. Soft, non-tender and mildly distended, tympanic to percussion, several old healed scars from prior surgeries without masses, hepatosplenomegaly or hernias noted.  No guarding or rebound tenderness.   Rectal: Not performed Msk:  Symmetrical without gross deformities. Good, equal movement & strength bilaterally. Pulses:  Normal pulses noted. Extremities:  No clubbing or edema.  No cyanosis. Neurologic:  Alert and oriented x3;  grossly normal neurologically. Skin:  Intact without significant lesions or rashes. No jaundice. Psych:  Alert and cooperative. Normal mood and affect.  Imaging Studies: Reviewed  Assessment and Plan:   Braylynn P Zwahlen is a 49 y.o. male with history of sigmoid diverticulitis s/p sigmoid colectomy in 7106 complicated by anastomotic leak with abscesses and fistula, s/p diverting loop ileostomy, repair of fistula, and takedown in 11/2009 with chronic intermittent episodes of abdominal pain associated with abdominal bloating, nausea and vomiting consistent with recurrent partial small bowel obstructions.  Patient had several colonoscopies in the past with no evidence of inflammatory bowel disease including surgical specimens.  His bowel obstructions are most likely secondary to adhesions.  EGD revealed pyloric stenosis which was dilated.   Duodenal, gastric and esophageal biopsies were unremarkable. Colonoscopy was normal Abdominal pain with bloating has modestly improved since last visit  Trial of FD guard/IBgard Recommended to maintain a food diary to assess symptom correlation Recommend to continue dicyclomine 20 mg 3 times a day as needed Can continue Prilosec 40 mg once a day before breakfast for heartburn Finish course of vitamin D and switch to over-the-counter daily vitamin D supplements  Pyloric stenosis Will discuss with patient about repeat EGD for dilation of pyloric stenosis if needed  Follow up in 3 months   Cephas Darby, MD

## 2019-04-23 ENCOUNTER — Other Ambulatory Visit: Payer: Self-pay | Admitting: Gastroenterology

## 2019-04-23 DIAGNOSIS — K219 Gastro-esophageal reflux disease without esophagitis: Secondary | ICD-10-CM

## 2019-05-28 ENCOUNTER — Other Ambulatory Visit: Payer: Self-pay | Admitting: Gastroenterology

## 2019-05-28 DIAGNOSIS — R109 Unspecified abdominal pain: Secondary | ICD-10-CM

## 2019-05-28 DIAGNOSIS — K219 Gastro-esophageal reflux disease without esophagitis: Secondary | ICD-10-CM

## 2019-05-28 DIAGNOSIS — R1084 Generalized abdominal pain: Secondary | ICD-10-CM

## 2019-05-28 NOTE — Telephone Encounter (Signed)
Last office visit 04/09/2019 abdominal bloating  Last refill Omeprazole 04/09/2019 1 refills  Bentyl 04/09/2019 2 refills

## 2019-06-08 ENCOUNTER — Other Ambulatory Visit: Payer: Self-pay

## 2019-06-08 DIAGNOSIS — K219 Gastro-esophageal reflux disease without esophagitis: Secondary | ICD-10-CM

## 2019-06-08 MED ORDER — OMEPRAZOLE 40 MG PO CPDR: 40.0000 mg | DELAYED_RELEASE_CAPSULE | Freq: Every day | ORAL | 0 refills | Status: DC

## 2019-06-08 NOTE — Telephone Encounter (Signed)
Last office visit 01/18/2021abdominal bloating  Last refill 04/09/2019 1 refills

## 2019-06-12 ENCOUNTER — Other Ambulatory Visit: Payer: Self-pay | Admitting: Gastroenterology

## 2019-06-12 DIAGNOSIS — R1084 Generalized abdominal pain: Secondary | ICD-10-CM

## 2019-06-12 DIAGNOSIS — R109 Unspecified abdominal pain: Secondary | ICD-10-CM

## 2019-07-16 ENCOUNTER — Other Ambulatory Visit: Payer: Self-pay

## 2019-07-16 ENCOUNTER — Ambulatory Visit: Payer: Self-pay | Admitting: Gastroenterology

## 2019-07-16 ENCOUNTER — Encounter: Payer: Self-pay | Admitting: Gastroenterology

## 2019-07-16 VITALS — BP 130/72 | HR 66 | Temp 98.5°F | Wt 183.5 lb

## 2019-07-16 DIAGNOSIS — K311 Adult hypertrophic pyloric stenosis: Secondary | ICD-10-CM

## 2019-07-16 DIAGNOSIS — K56609 Unspecified intestinal obstruction, unspecified as to partial versus complete obstruction: Secondary | ICD-10-CM

## 2019-07-16 DIAGNOSIS — Z8719 Personal history of other diseases of the digestive system: Secondary | ICD-10-CM

## 2019-07-16 NOTE — Patient Instructions (Signed)
Your MRI is scheduled for 08/31/2019 arrive at the medical mall at 8:30am. Nothing to eat or drink after midnight

## 2019-07-16 NOTE — Progress Notes (Signed)
[name], MD [address]  [address]  [place], [place] [address]  Main: [phone]  Fax: [phone]     Gastroenterology Consultation  Referring Provider:     [name], MD Primary Care Physician:  [name], MD Primary Gastroenterologist:  Dr. [name] Reason for Consultation: Abdominal pain, h/o sbo         HPI:   [name] is a 49 y.o. male referred by Dr. [name], MD  for consultation & management of chronic abdominal pain and recurrent partial SBO.  For last 4-5 years, patient has been experiencing recurrent episodes of severe abdominal pain in different locations, associated with abdominal bloating, nausea and vomiting followed by relief of abdominal pain.  He had multiple ER visits as well as several admissions secondary to partial small bowel obstructions ever since he had complicated surgery in 2011.  Most recently, admitted to [facility] on 01/15/2019 secondary to severe anorectal pain, found to have strangulated internal hemorrhoids, underwent hemorrhoidectomy of the left lateral hemorrhoids by Dr. Everlene Farrier.  Patient is referred to  GI by Dr. Everlene Farrier for further management of abdominal pain and history of partial SBO and to evaluate for possible Crohn's.  Patient is discharged home on dicyclomine and he thinks that the only medication which has helped him so far.  Today, he does not have active abdominal symptoms since admission.  However, he continues to have mild to moderate flareups at least 2-3 times a month.  He has adjusted his diet to minimize his symptoms.  Cannot tolerate high roughage foods at all.  His weight fluctuates but overall stable within a certain range.  He does not have anemia.  He does report fatigue.  His bowel movements fluctuate from 2-3 times a day to 3 to 4 days without a BM associated with significant straining.  He denies rectal bleeding at this time.  He thinks he is recovering well from hemorrhoidectomy.   He denies smoking or alcohol use.  He works for [employer], in [place]  Patient has been followed by a gastroenterologist, Dr. [name] in [place] until recently.  Patient tells me that at some point he was told he has Crohn's disease.  Patient had multiple GI surgeries as listed below Hemorrhoidectomy in 2001 Cholecystectomy in 12/2002 Sigmoid colectomy on 07/19/2009 complicated by anastomotic leak of the sigmoid colon with abscesses and fistula resulting in repair of the anastomotic leak, diverting loop ileostomy and small bowel resection in 09/08/2009 Takedown of loop ileostomy 11/20/2009 Hemorrhoidectomy 01/16/2019  Follow-up visit 04/09/2019 Since last visit, patient underwent EGD and colonoscopy and feels significantly better.  He continues to take Bentyl 20 mg as needed which provides significant relief of his abdominal pain and bloating.  He had severe vitamin D deficiency, currently taking 50,000 units weekly.  He reports that his energy levels have improved, gained few pounds. Patient finished 2 weeks course of rifaximin which he thinks has modestly helped  Follow-up visit 07/16/2019 Patient reports that omeprazole and Bentyl have been helping with his GI symptoms.  He also reports abdominal discomfort in the epigastric region associated with early satiety, fullness and bloating.  He had pork chops yesterday and reports that the symptoms are worse today.  He has history of mild erosive esophagitis and continues to take omeprazole 40 mg daily.  His weight has been stable.  He is taking B12 supplements daily  NSAIDs: None  Antiplts/Anticoagulants/Anti thrombotics: None  GI Procedures: EGD and colonoscopy 03/05/2019  -  Normal duodenal bulb and second portion of the duodenum. Biopsied. 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 - Small hiatal hernia. - Normal stomach. Biopsied. - Esophagogastric landmarks identified. - Widely patent Schatzki ring. - LA Grade A  reflux esophagitis with no bleeding. - Normal esophagus. Biopsied.  - The examined portion of the ileum was normal. - Patent functional end-to-end colo-colonic anastomosis, characterized by healthy appearing mucosa. - Normal mucosa in the entire examined colon. - Non-bleeding internal hemorrhoids. - No specimens collected.  DIAGNOSIS:  A. DUODENUM; COLD BIOPSY:  - UNREMARKABLE DUODENAL MUCOSA.  - NEGATIVE FOR FEATURES OF CELIAC DISEASE, DYSPLASIA, AND MALIGNANCY.   B. STOMACH, RANDOM; COLD BIOPSY:  - ANTRAL MUCOSA WITH FOCAL REACTIVE GASTRITIS.  - UNREMARKABLE OXYNTIC MUCOSA.  - NEGATIVE FOR H. PYLORI, DYSPLASIA, AND MALIGNANCY.   C. ESOPHAGUS; COLD BIOPSY:  - UNREMARKABLE SQUAMOUS MUCOSA.  - NEGATIVE FOR INTRAEPITHELIAL EOSINOPHILS, DYSPLASIA, AND MALIGNANCY.  Colonoscopy 11/11/1999 I. SMALL BOWEL, ILEUM BIOPSY: UNREMARKABLE SMALL BOWEL MUCOSA.  NO VILLOUS ATROPHY, INFLAMMATION OR OTHER ABNORMALITIES PRESENT.   II. COLON, BIOPSY: UNREMARKABLE COLONIC MUCOSA. NO SIGNIFICANT  INFLAMMATION OR OTHER ABNORMALITIES IDENTIFIED.   Colonoscopy 11/29/2000 I. SMALL BOWEL, ILEUM BIOPSY: UNREMARKABLE SMALL BOWEL MUCOSA.  NO VILLOUS ATROPHY, INFLAMMATION OR OTHER ABNORMALITIES PRESENT.   II. COLON, BIOPSY: UNREMARKABLE COLONIC MUCOSA. NO SIGNIFICANT  INFLAMMATION OR OTHER ABNORMALITIES IDENTIFIED.   Colonoscopy 02/28/2004 COLON, BIOPSY: NO ACTIVE COLITIS OR DYSPLASIA IDENTIFIED. SEE  COMMENT (BIOPSIES, RECTOSIGMOID)   COMMENT  There is benign colorectal mucosa in which there is mild to  moderate chronic inflammation within the lamina propria. At the  deep edge of one of the fragments there is a superficial aspect  of a mucosal lymphoid aggregate. This could conceivable account  for the nodularity noted colonoscopically. There is a rare gland  that contains a neutrophil but otherwise no active inflammation  is seen and the findings are non-specific. (JAS:gt)  03/02/04  Past Medical History:  Diagnosis Date  . Anemia    vitamin d deficiency  . Collagen vascular disease ([place])   . Diverticulitis   . History of hiatal hernia   . Small bowel obstruction [facility])     Past Surgical History:  Procedure Laterality Date  . arm surgery    . CHOLECYSTECTOMY    . COLON RESECTION    . COLON SURGERY  2011   small bowel resection  . COLONOSCOPY WITH PROPOFOL N/A 03/05/2019   Procedure: COLONOSCOPY WITH PROPOFOL;  Surgeon: Lin Landsman, MD;  Location: [facility] ENDOSCOPY;  Service: Gastroenterology;  Laterality: N/A;  . ESOPHAGOGASTRODUODENOSCOPY (EGD) WITH PROPOFOL N/A 03/05/2019   Procedure: ESOPHAGOGASTRODUODENOSCOPY (EGD) WITH PROPOFOL;  Surgeon: Lin Landsman, MD;  Location: [facility] ENDOSCOPY;  Service: Gastroenterology;  Laterality: N/A;  . HEMORRHOID SURGERY N/A 01/16/2019   Procedure: HEMORRHOIDECTOMY;  Surgeon: Jules Husbands, MD;  Location: [facility] ORS;  Service: General;  Laterality: N/A;  . ileostomy takedown  2011  . loop ileostomy and repair of anastomotic leak at sigmoid colon  2011    Current Outpatient Medications:  .  dicyclomine (BENTYL) 20 MG tablet, TAKE 1 TABLET (20 MG TOTAL) BY MOUTH 3 (THREE) TIMES DAILY BEFORE MEALS., Disp: 90 tablet, Rfl: 0 .  omeprazole (PRILOSEC) 40 MG capsule, Take 1 capsule (40 mg total) by mouth daily before breakfast., Disp: 90 capsule, Rfl: 0 .  vitamin B-12 (CYANOCOBALAMIN) 1000 MCG tablet, Take 1,000 mcg by mouth daily., Disp: , Rfl:    Family History  Problem Relation Age of Onset  .  Heart disease Mother   . Heart disease Father      Social History   Tobacco Use  . Smoking status: Former Smoker    Packs/day: 2.00    Types: Cigarettes    Quit date: 2017    Years since quitting: 4.3  . Smokeless tobacco: Never Used  Substance Use Topics  . Alcohol use: No  . Drug use: No    Allergies as of 07/16/2019  . (No Known Allergies)    Review of Systems:    All systems reviewed and  negative except where noted in HPI.   Physical Exam:  BP 130/72 (BP Location: Left Arm, Patient Position: Sitting, Cuff Size: Normal)   Pulse 66   Temp 98.5 F (36.9 C) (Oral)   Wt 183 lb 8 oz (83.2 kg)   BMI 24.89 kg/m  No LMP for male patient.  General:   Alert,  Well-developed, well-nourished, pleasant and cooperative in NAD Head:  Normocephalic and atraumatic. Eyes:  Sclera clear, no icterus.   Conjunctiva pink. Ears:  Normal auditory acuity. Nose:  No deformity, discharge, or lesions. Mouth:  No deformity or lesions,oropharynx pink & moist. Neck:  Supple; no masses or thyromegaly. Lungs:  Respirations even and unlabored.  Clear throughout to auscultation.   No wheezes, crackles, or rhonchi. No acute distress. Heart:  Regular rate and rhythm; no murmurs, clicks, rubs, or gallops. Abdomen:  Normal bowel sounds. Soft, non-tender and mildly distended, tympanic to percussion, several old healed scars from prior surgeries without masses, hepatosplenomegaly or hernias noted.  No guarding or rebound tenderness.   Rectal: Not performed Msk:  Symmetrical without gross deformities. Good, equal movement & strength bilaterally. Pulses:  Normal pulses noted. Extremities:  No clubbing or edema.  No cyanosis. Neurologic:  Alert and oriented x3;  grossly normal neurologically. Skin:  Intact without significant lesions or rashes. No jaundice. Psych:  Alert and cooperative. Normal mood and affect.  Imaging Studies: Reviewed  Assessment and Plan:   [name] is a 49 y.o. male with history of sigmoid diverticulitis s/p sigmoid colectomy in 2011 complicated by anastomotic leak with abscesses and fistula, s/p diverting loop ileostomy, repair of fistula, and takedown in 11/2009 with chronic intermittent episodes of abdominal pain associated with abdominal bloating, nausea and vomiting consistent with recurrent partial small bowel obstructions.  Patient had several colonoscopies in the past with no  evidence of inflammatory bowel disease including surgical specimens.  His bowel obstructions are most likely secondary to adhesions.  EGD revealed pyloric stenosis which was dilated.  Duodenal, gastric and esophageal biopsies were unremarkable. Colonoscopy was normal Abdominal pain with bloating has modestly improved since last visit  Epigastric pain, bloating and early satiety Patient has history of pyloric stenosis, discussed about upper endoscopy and he would like to schedule it in July to evaluate for dilation of pyloric stenosis Gastric and duodenal biopsies negative in the past Continue omeprazole 40 mg daily  Lower abdominal pain Continue dicyclomine 20 mg 3 times a day as needed  Severe vitamin D deficiency Recheck vitamin D levels  History of small bowel obstructions Check fecal calprotectin levels, MR enterography  Follow up in 3 months   [name], MD

## 2019-07-17 ENCOUNTER — Telehealth: Payer: Self-pay

## 2019-07-17 DIAGNOSIS — E559 Vitamin D deficiency, unspecified: Secondary | ICD-10-CM

## 2019-07-17 NOTE — Addendum Note (Signed)
Addended by: Radene Knee L on: 07/17/2019 12:52 PM   Modules accepted: Orders

## 2019-07-17 NOTE — Telephone Encounter (Signed)
Informed patient of this information ?

## 2019-07-17 NOTE — Telephone Encounter (Signed)
Okay, please order vitamin D levels and please let him know to get it done near any local [facility]  RV

## 2019-07-17 NOTE — Telephone Encounter (Signed)
-----   Message from Toney Reil, MD sent at 07/16/2019  5:06 PM EDT ----- Regarding: Vitamin D levels AshleyCan we add vitamin D to the labs from today  Thanks RV

## 2019-07-17 NOTE — Telephone Encounter (Signed)
You just order a stool test not a blood test. Do you want him to come back to have the labs done

## 2019-07-30 ENCOUNTER — Other Ambulatory Visit: Payer: Self-pay

## 2019-07-30 ENCOUNTER — Ambulatory Visit: Payer: Self-pay

## 2019-08-29 ENCOUNTER — Other Ambulatory Visit: Payer: Self-pay

## 2019-08-29 DIAGNOSIS — K219 Gastro-esophageal reflux disease without esophagitis: Secondary | ICD-10-CM

## 2019-08-29 NOTE — Telephone Encounter (Signed)
Last office visit 07/16/2019 Pyloric stenosis  Last refill 06/08/2019 0 refills 90  Capsule

## 2019-08-30 MED ORDER — OMEPRAZOLE 40 MG PO CPDR: 40.0000 mg | DELAYED_RELEASE_CAPSULE | Freq: Every day | ORAL | 0 refills | Status: DC

## 2019-08-31 ENCOUNTER — Ambulatory Visit: Payer: No Typology Code available for payment source

## 2019-08-31 ENCOUNTER — Other Ambulatory Visit: Payer: Self-pay

## 2019-08-31 LAB — CALPROTECTIN, FECAL: Calprotectin, Fecal: 58 ug/g (ref 0–120)

## 2019-09-27 ENCOUNTER — Telehealth: Payer: Self-pay

## 2019-09-27 NOTE — Telephone Encounter (Signed)
Patient states he has not had CT scan before his EGD that he was supposed to have done. He states that he is going to call and make this appointment. He said to cancel the EGD for right now and he will give [place] a call back to rescheduled the procedure.

## 2019-10-12 ENCOUNTER — Ambulatory Visit: Admit: 2019-10-12 | Payer: No Typology Code available for payment source | Admitting: Gastroenterology

## 2019-10-12 SURGERY — ESOPHAGOGASTRODUODENOSCOPY (EGD) WITH PROPOFOL
Anesthesia: General

## 2019-11-28 ENCOUNTER — Telehealth: Payer: Self-pay | Admitting: Gastroenterology

## 2019-11-28 NOTE — Telephone Encounter (Signed)
Patient called and wanted to schedule an appointment with Dr. Allegra Lai to discuss medications and having  Procedures..Patient wanted a refill on omeprazole (PRILOSEC) 40 MG capsule..Clinical staff was informed.

## 2019-11-28 NOTE — Telephone Encounter (Signed)
Patient called and wanted to schedule an appointment with Dr. Vanga to discuss medications and having  Procedures..Patient wanted a refill on omeprazole (PRILOSEC) 40 MG capsule..Clinical staff was informed. 

## 2019-12-18 ENCOUNTER — Other Ambulatory Visit: Payer: Self-pay

## 2019-12-20 ENCOUNTER — Encounter: Payer: Self-pay | Admitting: Gastroenterology

## 2019-12-20 ENCOUNTER — Ambulatory Visit (INDEPENDENT_AMBULATORY_CARE_PROVIDER_SITE_OTHER): Payer: No Typology Code available for payment source | Admitting: Gastroenterology

## 2019-12-20 ENCOUNTER — Other Ambulatory Visit: Payer: Self-pay

## 2019-12-20 VITALS — BP 147/91 | HR 75 | Temp 98.4°F | Ht 72.0 in | Wt 181.6 lb

## 2019-12-20 DIAGNOSIS — E538 Deficiency of other specified B group vitamins: Secondary | ICD-10-CM

## 2019-12-20 DIAGNOSIS — E559 Vitamin D deficiency, unspecified: Secondary | ICD-10-CM

## 2019-12-20 DIAGNOSIS — K311 Adult hypertrophic pyloric stenosis: Secondary | ICD-10-CM | POA: Diagnosis not present

## 2019-12-20 NOTE — Patient Instructions (Signed)
1.  Eliminate carbonated beverages and cut back on sugar sweetened tea 2.  Start MiraLAX 1 capful once a day 3.  Start soluble fiber such as Benefiber or Citrucel or fiber choice 1-2 times daily 4.  Recommend upper endoscopy for further evaluation 5.  We will check labs  Please call our office to speak with my nurse Radene Knee [phone] during business hours from 8am to 4pm if you have any questions/concerns. During after hours, you will be redirected to on call GI physician. For any emergency please call 911 or go the nearest emergency room.    [name], MD [address]  [address]  [place], [place] [address]  Main: [phone]  Fax: [phone]

## 2019-12-20 NOTE — Progress Notes (Signed)
George Repress, MD [address]  [address]  [place], [place] [address]  Main: [phone]  Fax: [phone]     Gastroenterology Consultation  Referring Provider:     [name], MD Primary Care Physician:  [name], MD Primary Gastroenterologist:  Dr. Arlyss [name] Reason for Consultation: Abdominal pain, h/o sbo         HPI:   [name] is a 49 y.o. male referred by Dr. [name], MD  for consultation & management of chronic abdominal pain and recurrent partial SBO.  For last 4-5 years, patient has been experiencing recurrent episodes of severe abdominal pain in different locations, associated with abdominal bloating, nausea and vomiting followed by relief of abdominal pain.  He had multiple ER visits as well as several admissions secondary to partial small bowel obstructions ever since he had complicated surgery in 2011.  Most recently, admitted to [facility] on 01/15/2019 secondary to severe anorectal pain, found to have strangulated internal hemorrhoids, underwent hemorrhoidectomy of the left lateral hemorrhoids by Dr. Everlene Farrier.  Patient is referred to [facility] by Dr. Everlene Farrier for further management of abdominal pain and history of partial SBO and to evaluate for possible Crohn's.  Patient is discharged home on dicyclomine and he thinks that the only medication which has helped him so far.  Today, he does not have active abdominal symptoms since admission.  However, he continues to have mild to moderate flareups at least 2-3 times a month.  He has adjusted his diet to minimize his symptoms.  Cannot tolerate high roughage foods at all.  His weight fluctuates but overall stable within a certain range.  He does not have anemia.  He does report fatigue.  His bowel movements fluctuate from 2-3 times a day to 3 to 4 days without a BM associated with significant straining.  He denies rectal bleeding at this time.  He thinks he is recovering well from hemorrhoidectomy.   He denies smoking or alcohol use.  He works for [employer], in [place]  Patient has been followed by a gastroenterologist, Dr. [name] in [place] until recently.  Patient tells me that at some point he was told he has Crohn's disease.  Patient had multiple GI surgeries as listed below Hemorrhoidectomy in 2001 Cholecystectomy in 12/2002 Sigmoid colectomy on 07/19/2009 complicated by anastomotic leak of the sigmoid colon with abscesses and fistula resulting in repair of the anastomotic leak, diverting loop ileostomy and small bowel resection in 09/08/2009 Takedown of loop ileostomy 11/20/2009 Hemorrhoidectomy 01/16/2019  Follow-up visit 04/09/2019 Since last visit, patient underwent EGD and colonoscopy and feels significantly better.  He continues to take Bentyl 20 mg as needed which provides significant relief of his abdominal pain and bloating.  He had severe vitamin D deficiency, currently taking 50,000 units weekly.  He reports that his energy levels have improved, gained few pounds. Patient finished 2 weeks course of rifaximin which he thinks has modestly helped  Follow-up visit 07/16/2019 Patient reports that omeprazole and Bentyl have been helping with his GI symptoms.  He also reports abdominal discomfort in the epigastric region associated with early satiety, fullness and bloating.  He had pork chops yesterday and reports that the symptoms are worse today.  He has history of mild erosive esophagitis and continues to take omeprazole 40 mg daily.  His weight has been stable.  He is taking B12 supplements daily  Follow-up visit 12/20/2019 Patient reports ongoing epigastric pain associated with nausea, severe bloating, the symptoms  are predominantly postprandial.  He reports burning pain in the retrosternal area as well.  Currently taking omeprazole 40 mg daily before breakfast.  He does consume a can of carbonated beverage and ice tea every day.  He reports his bowel movements are  somewhat hard and feels gassy all the time.  He has not tried any stool softener.  NSAIDs: None  Antiplts/Anticoagulants/Anti thrombotics: None  GI Procedures: EGD and colonoscopy 03/05/2019  - Normal duodenal bulb and second portion of the duodenum. Biopsied. 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 - Small hiatal hernia. - Normal stomach. Biopsied. - Esophagogastric landmarks identified. - Widely patent Schatzki ring. - LA Grade A reflux esophagitis with no bleeding. - Normal esophagus. Biopsied.  - The examined portion of the ileum was normal. - Patent functional end-to-end colo-colonic anastomosis, characterized by healthy appearing mucosa. - Normal mucosa in the entire examined colon. - Non-bleeding internal hemorrhoids. - No specimens collected.  DIAGNOSIS:  A. DUODENUM; COLD BIOPSY:  - UNREMARKABLE DUODENAL MUCOSA.  - NEGATIVE FOR FEATURES OF CELIAC DISEASE, DYSPLASIA, AND MALIGNANCY.   B. STOMACH, RANDOM; COLD BIOPSY:  - ANTRAL MUCOSA WITH FOCAL REACTIVE GASTRITIS.  - UNREMARKABLE OXYNTIC MUCOSA.  - NEGATIVE FOR H. PYLORI, DYSPLASIA, AND MALIGNANCY.   C. ESOPHAGUS; COLD BIOPSY:  - UNREMARKABLE SQUAMOUS MUCOSA.  - NEGATIVE FOR INTRAEPITHELIAL EOSINOPHILS, DYSPLASIA, AND MALIGNANCY.  Colonoscopy 11/11/1999 I. SMALL BOWEL, ILEUM BIOPSY: UNREMARKABLE SMALL BOWEL MUCOSA.  NO VILLOUS ATROPHY, INFLAMMATION OR OTHER ABNORMALITIES PRESENT.   II. COLON, BIOPSY: UNREMARKABLE COLONIC MUCOSA. NO SIGNIFICANT  INFLAMMATION OR OTHER ABNORMALITIES IDENTIFIED.   Colonoscopy 11/29/2000 I. SMALL BOWEL, ILEUM BIOPSY: UNREMARKABLE SMALL BOWEL MUCOSA.  NO VILLOUS ATROPHY, INFLAMMATION OR OTHER ABNORMALITIES PRESENT.   II. COLON, BIOPSY: UNREMARKABLE COLONIC MUCOSA. NO SIGNIFICANT  INFLAMMATION OR OTHER ABNORMALITIES IDENTIFIED.   Colonoscopy 02/28/2004 COLON, BIOPSY: NO ACTIVE COLITIS OR DYSPLASIA IDENTIFIED. SEE  COMMENT (BIOPSIES, RECTOSIGMOID)   COMMENT   There is benign colorectal mucosa in which there is mild to  moderate chronic inflammation within the lamina propria. At the  deep edge of one of the fragments there is a superficial aspect  of a mucosal lymphoid aggregate. This could conceivable account  for the nodularity noted colonoscopically. There is a rare gland  that contains a neutrophil but otherwise no active inflammation  is seen and the findings are non-specific. (JAS:gt) 03/02/04  Past Medical History:  Diagnosis Date  . Collagen vascular disease (HCC)   . Diverticulitis   . History of hiatal hernia   . Small bowel obstruction [facility])     Past Surgical History:  Procedure Laterality Date  . arm surgery    . CHOLECYSTECTOMY    . COLON RESECTION    . COLON SURGERY  2011   small bowel resection  . COLONOSCOPY WITH PROPOFOL N/A 03/05/2019   Procedure: COLONOSCOPY WITH PROPOFOL;  Surgeon: Toney Reil, MD;  Location: [facility] ENDOSCOPY;  Service: Gastroenterology;  Laterality: N/A;  . ESOPHAGOGASTRODUODENOSCOPY (EGD) WITH PROPOFOL N/A 03/05/2019   Procedure: ESOPHAGOGASTRODUODENOSCOPY (EGD) WITH PROPOFOL;  Surgeon: Toney Reil, MD;  Location: [facility] ENDOSCOPY;  Service: Gastroenterology;  Laterality: N/A;  . HEMORRHOID SURGERY N/A 01/16/2019   Procedure: HEMORRHOIDECTOMY;  Surgeon: Leafy Ro, MD;  Location: [facility] ORS;  Service: General;  Laterality: N/A;  . ileostomy takedown  2011  . loop ileostomy and repair of anastomotic leak at sigmoid colon  2011    Current Outpatient Medications:  .  vitamin B-12 (CYANOCOBALAMIN) 1000 MCG tablet, Take 1,000  mcg by mouth daily., Disp: , Rfl:  .  dicyclomine (BENTYL) 20 MG tablet, TAKE 1 TABLET (20 MG TOTAL) BY MOUTH 3 (THREE) TIMES DAILY BEFORE MEALS., Disp: 90 tablet, Rfl: 0 .  HYDROcodone-acetaminophen (NORCO/VICODIN) 5-325 MG tablet, Take 1 tablet by mouth every 4 (four) hours as needed. (Patient not taking: Reported on 12/20/2019), Disp: , Rfl:  .  omeprazole  (PRILOSEC) 40 MG capsule, Take 1 capsule (40 mg total) by mouth daily before breakfast., Disp: 90 capsule, Rfl: 0   Family History  Problem Relation Age of Onset  . Heart disease Mother   . Heart disease Father      Social History   Tobacco Use  . Smoking status: Former Smoker    Packs/day: 2.00    Types: Cigarettes    Quit date: 2017    Years since quitting: 4.7  . Smokeless tobacco: Never Used  Vaping Use  . Vaping Use: Every day  . Devices: rare use  Substance Use Topics  . Alcohol use: No  . Drug use: No    Allergies as of 12/20/2019  . (No Known Allergies)    Review of Systems:    All systems reviewed and negative except where noted in HPI.   Physical Exam:  BP (!) 147/91 (BP Location: Right Arm, Patient Position: Sitting, Cuff Size: Normal)   Pulse 75   Temp 98.4 F (36.9 C) (Oral)   Ht 6' (1.829 m)   Wt 181 lb 9.6 oz (82.4 kg)   BMI 24.63 kg/m  No LMP for male patient.  General:   Alert,  Well-developed, well-nourished, pleasant and cooperative in NAD Head:  Normocephalic and atraumatic. Eyes:  Sclera clear, no icterus.   Conjunctiva pink. Ears:  Normal auditory acuity. Nose:  No deformity, discharge, or lesions. Mouth:  No deformity or lesions,oropharynx pink & moist. Neck:  Supple; no masses or thyromegaly. Lungs:  Respirations even and unlabored.  Clear throughout to auscultation.   No wheezes, crackles, or rhonchi. No acute distress. Heart:  Regular rate and rhythm; no murmurs, clicks, rubs, or gallops. Abdomen:  Normal bowel sounds. Soft, non-tender and mildly distended, tympanic to percussion, several old healed scars from prior surgeries without masses, hepatosplenomegaly or hernias noted.  No guarding or rebound tenderness.   Rectal: Not performed Msk:  Symmetrical without gross deformities. Good, equal movement & strength bilaterally. Pulses:  Normal pulses noted. Extremities:  No clubbing or edema.  No cyanosis. Neurologic:  Alert and  oriented x3;  grossly normal neurologically. Skin:  Intact without significant lesions or rashes. No jaundice. Psych:  Alert and cooperative. Normal mood and affect.  Imaging Studies: Reviewed  Assessment and Plan:   [name] is a 49 y.o. male with history of sigmoid diverticulitis s/p sigmoid colectomy in 2011 complicated by anastomotic leak with abscesses and fistula, s/p diverting loop ileostomy, repair of fistula, and takedown in 11/2009 with chronic intermittent episodes of abdominal pain associated with abdominal bloating, nausea and vomiting consistent with recurrent partial small bowel obstructions.  Patient had several colonoscopies in the past with no evidence of inflammatory bowel disease including surgical specimens.  His bowel obstructions are most likely secondary to adhesions.  EGD revealed pyloric stenosis which was dilated.  Duodenal, gastric and esophageal biopsies were unremarkable. Colonoscopy was normal Abdominal pain with bloating has modestly improved since last visit  Epigastric pain, bloating and early satiety Patient has history of pyloric stenosis, I have discussed about upper endoscopy again today and he is agreeable  Gastric and duodenal biopsies negative in the past Increase omeprazole to 40 mg 2 times daily  Severe vitamin D deficiency Recheck vitamin D levels  History of small bowel obstructions fecal calprotectin levels are normal, patient did not undergo MR enterography yet.  Will wait for the results of upper endoscopy  History of B12 deficiency Check B12 levels Recheck CBC, CMP, iron panel as well as folate levels  Follow up in 3 months   George Repress, MD

## 2019-12-21 LAB — CBC
Hematocrit: 39.3 % (ref 37.5–51.0)
Hemoglobin: 12.7 g/dL — ABNORMAL LOW (ref 13.0–17.7)
MCH: 27.3 pg (ref 26.6–33.0)
MCHC: 32.3 g/dL (ref 31.5–35.7)
MCV: 84 fL (ref 79–97)
Platelets: 322 10*3/uL (ref 150–450)
RBC: 4.66 x10E6/uL (ref 4.14–5.80)
RDW: 13.4 % (ref 11.6–15.4)
WBC: 6.2 10*3/uL (ref 3.4–10.8)

## 2019-12-21 LAB — COMPREHENSIVE METABOLIC PANEL
ALT: 12 IU/L (ref 0–44)
AST: 14 IU/L (ref 0–40)
Albumin/Globulin Ratio: 1.2 (ref 1.2–2.2)
Albumin: 3.9 g/dL (ref 3.8–4.9)
Alkaline Phosphatase: 82 IU/L (ref 44–121)
BUN/Creatinine Ratio: 10 (ref 9–20)
BUN: 12 mg/dL (ref 6–24)
Bilirubin Total: 0.7 mg/dL (ref 0.0–1.2)
CO2: 23 mmol/L (ref 20–29)
Calcium: 8.9 mg/dL (ref 8.7–10.2)
Chloride: 103 mmol/L (ref 96–106)
Creatinine, Ser: 1.22 mg/dL (ref 0.76–1.27)
GFR calc Af Amer: 79 mL/min/{1.73_m2} (ref >59)
GFR calc non Af Amer: 68 mL/min/{1.73_m2} (ref >59)
Globulin, Total: 3.3 g/dL (ref 1.5–4.5)
Glucose: 100 mg/dL — ABNORMAL HIGH (ref 65–99)
Potassium: 4 mmol/L (ref 3.5–5.2)
Sodium: 140 mmol/L (ref 134–144)
Total Protein: 7.2 g/dL (ref 6.0–8.5)

## 2019-12-21 LAB — IRON,TIBC AND FERRITIN PANEL
Ferritin: 11 ng/mL — ABNORMAL LOW (ref 30–400)
Iron Saturation: 7 % — CL (ref 15–55)
Iron: 28 ug/dL — ABNORMAL LOW (ref 38–169)
Total Iron Binding Capacity: 381 ug/dL (ref 250–450)
UIBC: 353 ug/dL — ABNORMAL HIGH (ref 111–343)

## 2019-12-21 LAB — B12 AND FOLATE PANEL
Folate: 5.7 ng/mL (ref >3.0)
Vitamin B-12: 1226 pg/mL (ref 232–1245)

## 2019-12-21 LAB — VITAMIN D 25 HYDROXY (VIT D DEFICIENCY, FRACTURES): Vit D, 25-Hydroxy: 24.9 ng/mL — ABNORMAL LOW (ref 30.0–100.0)

## 2020-02-05 ENCOUNTER — Other Ambulatory Visit: Payer: No Typology Code available for payment source

## 2020-02-07 ENCOUNTER — Encounter: Admission: RE | Payer: Self-pay | Source: Home / Self Care

## 2020-02-07 ENCOUNTER — Ambulatory Visit
Admission: RE | Admit: 2020-02-07 | Payer: No Typology Code available for payment source | Source: Home / Self Care | Admitting: Gastroenterology

## 2020-02-07 SURGERY — ESOPHAGOGASTRODUODENOSCOPY (EGD) WITH PROPOFOL
Anesthesia: Choice

## 2020-03-11 ENCOUNTER — Ambulatory Visit: Payer: No Typology Code available for payment source | Admitting: Gastroenterology

## 2020-03-14 ENCOUNTER — Emergency Department
Admission: EM | Admit: 2020-03-14 | Discharge: 2020-03-14 | Discharge status: Against Medical Advice | Payer: No Typology Code available for payment source | Attending: Emergency Medicine | Admitting: Emergency Medicine

## 2020-03-14 ENCOUNTER — Other Ambulatory Visit: Payer: Self-pay

## 2020-03-14 ENCOUNTER — Emergency Department: Payer: No Typology Code available for payment source

## 2020-03-14 DIAGNOSIS — R1030 Lower abdominal pain, unspecified: Secondary | ICD-10-CM | POA: Diagnosis present

## 2020-03-14 DIAGNOSIS — K56600 Partial intestinal obstruction, unspecified as to cause: Secondary | ICD-10-CM | POA: Insufficient documentation

## 2020-03-14 DIAGNOSIS — K56699 Other intestinal obstruction unspecified as to partial versus complete obstruction: Secondary | ICD-10-CM

## 2020-03-14 DIAGNOSIS — Z87891 Personal history of nicotine dependence: Secondary | ICD-10-CM | POA: Insufficient documentation

## 2020-03-14 LAB — BASIC METABOLIC PANEL
Anion gap: 13 (ref 5–15)
BUN: 14 mg/dL (ref 6–20)
CO2: 21 mmol/L — ABNORMAL LOW (ref 22–32)
Calcium: 9.7 mg/dL (ref 8.9–10.3)
Chloride: 102 mmol/L (ref 98–111)
Creatinine, Ser: 1.18 mg/dL (ref 0.61–1.24)
GFR, Estimated: >60 mL/min (ref >60)
Glucose, Bld: 113 mg/dL — ABNORMAL HIGH (ref 70–99)
Potassium: 3.6 mmol/L (ref 3.5–5.1)
Sodium: 136 mmol/L (ref 135–145)

## 2020-03-14 LAB — CBC
HCT: 43.7 % (ref 39.0–52.0)
Hemoglobin: 14.6 g/dL (ref 13.0–17.0)
MCH: 27.5 pg (ref 26.0–34.0)
MCHC: 33.4 g/dL (ref 30.0–36.0)
MCV: 82.3 fL (ref 80.0–100.0)
Platelets: 349 10*3/uL (ref 150–400)
RBC: 5.31 MIL/uL (ref 4.22–5.81)
RDW: 13.7 % (ref 11.5–15.5)
WBC: 15 10*3/uL — ABNORMAL HIGH (ref 4.0–10.5)
nRBC: 0 % (ref 0.0–0.2)

## 2020-03-14 LAB — HEPATIC FUNCTION PANEL
ALT: 17 U/L (ref 0–44)
AST: 23 U/L (ref 15–41)
Albumin: 4.3 g/dL (ref 3.5–5.0)
Alkaline Phosphatase: 61 U/L (ref 38–126)
Bilirubin, Direct: 0.2 mg/dL (ref 0.0–0.2)
Indirect Bilirubin: 0.9 mg/dL (ref 0.3–0.9)
Total Bilirubin: 1.1 mg/dL (ref 0.3–1.2)
Total Protein: 8.7 g/dL — ABNORMAL HIGH (ref 6.5–8.1)

## 2020-03-14 LAB — LACTIC ACID, PLASMA: Lactic Acid, Venous: 1.8 mmol/L (ref 0.5–1.9)

## 2020-03-14 LAB — LIPASE, BLOOD: Lipase: 24 U/L (ref 11–51)

## 2020-03-14 MED ORDER — IOHEXOL 300 MG/ML  SOLN
100.0000 mL | Freq: Once | INTRAMUSCULAR | Status: AC | PRN
  Administered 2020-03-14: 12:00:00 100 mL via INTRAVENOUS

## 2020-03-14 MED ORDER — MORPHINE SULFATE (PF) 4 MG/ML IV SOLN
4.0000 mg | Freq: Once | INTRAVENOUS | Status: AC
  Administered 2020-03-14: 08:00:00 4 mg via INTRAVENOUS
  Filled 2020-03-14: qty 1

## 2020-03-14 MED ORDER — ONDANSETRON HCL 4 MG/2ML IJ SOLN
4.0000 mg | Freq: Once | INTRAMUSCULAR | Status: AC
  Administered 2020-03-14: 08:00:00 4 mg via INTRAVENOUS
  Filled 2020-03-14: qty 2

## 2020-03-14 MED ORDER — MORPHINE SULFATE (PF) 2 MG/ML IV SOLN
2.0000 mg | Freq: Once | INTRAVENOUS | Status: AC
  Administered 2020-03-14: 11:00:00 2 mg via INTRAVENOUS
  Filled 2020-03-14: qty 1

## 2020-03-14 MED ORDER — SODIUM CHLORIDE 0.9 % IV BOLUS
1000.0000 mL | Freq: Once | INTRAVENOUS | Status: AC
  Administered 2020-03-14: 08:00:00 1000 mL via INTRAVENOUS

## 2020-03-14 MED ORDER — PROMETHAZINE HCL 25 MG/ML IJ SOLN
12.5000 mg | Freq: Once | INTRAMUSCULAR | Status: AC
  Administered 2020-03-14: 11:00:00 12.5 mg via INTRAMUSCULAR
  Filled 2020-03-14: qty 1

## 2020-03-14 NOTE — Discharge Instructions (Addendum)
? ?  Please return to the emergency room right away if you are to develop a fever, severe nausea, your pain becomes severe or worsens, you are unable to keep food down, begin vomiting any dark or bloody fluid, you develop any dark or bloody stools, feel dehydrated, or other new concerns or symptoms arise. ? ?

## 2020-03-14 NOTE — ED Provider Notes (Signed)
[facility] Emergency Department Provider Note   ____________________________________________   Event Date/Time   First MD Initiated Contact with Patient 03/14/20 1052     (approximate)  I have reviewed the triage vital signs and the nursing notes.   HISTORY  Chief Complaint Abdominal Pain    HPI [name] is a 49 y.o. male the history of multiple previous small bowel obstructions and abdominal surgeries  Patient reports earlier today he began experiencing severe pain in his mid lower abdomen around his surgical scar.  He began vomiting several times.  He reports after coming to the ER he had a bowel movement and his pain is relieving itself now but still slightly present.  Ports this is happened multiple times in the past when he has had obstructions  No fevers or chills.  Positive for nausea and vomiting, no black or bloody vomit.  Had formed bowel movement after the pain went away it was severe and crampy, now just mild over his surgical site   Past Medical History:  Diagnosis Date  . Collagen vascular disease (HCC)   . Diverticulitis   . History of hiatal hernia   . Small bowel obstruction [facility])     Patient Active Problem List   Diagnosis Date Noted  . Adult pyloric stenosis   . Gastroesophageal reflux disease with esophagitis without hemorrhage   . Schatzki's ring   . Dyspepsia     Past Surgical History:  Procedure Laterality Date  . arm surgery    . CHOLECYSTECTOMY    . COLON RESECTION    . COLON SURGERY  2011   small bowel resection  . COLONOSCOPY WITH PROPOFOL N/A 03/05/2019   Procedure: COLONOSCOPY WITH PROPOFOL;  Surgeon: Toney Reil, MD;  Location: [facility] ENDOSCOPY;  Service: Gastroenterology;  Laterality: N/A;  . ESOPHAGOGASTRODUODENOSCOPY (EGD) WITH PROPOFOL N/A 03/05/2019   Procedure: ESOPHAGOGASTRODUODENOSCOPY (EGD) WITH PROPOFOL;  Surgeon: Toney Reil, MD;  Location: [facility] ENDOSCOPY;  Service:  Gastroenterology;  Laterality: N/A;  . HEMORRHOID SURGERY N/A 01/16/2019   Procedure: HEMORRHOIDECTOMY;  Surgeon: Leafy Ro, MD;  Location: [facility] ORS;  Service: General;  Laterality: N/A;  . ileostomy takedown  2011  . loop ileostomy and repair of anastomotic leak at sigmoid colon  2011    Prior to Admission medications   Medication Sig Start Date End Date Taking? Authorizing Provider  dicyclomine (BENTYL) 20 MG tablet TAKE 1 TABLET (20 MG TOTAL) BY MOUTH 3 (THREE) TIMES DAILY BEFORE MEALS. 06/12/19 07/16/19  Toney Reil, MD  HYDROcodone-acetaminophen (NORCO/VICODIN) 5-325 MG tablet Take 1 tablet by mouth every 4 (four) hours as needed. Patient not taking: Reported on 12/20/2019 06/25/19   [provider]  omeprazole (PRILOSEC) 40 MG capsule Take 1 capsule (40 mg total) by mouth daily before breakfast. 08/30/19 11/28/19  Toney Reil, MD  vitamin B-12 (CYANOCOBALAMIN) 1000 MCG tablet Take 1,000 mcg by mouth daily.    [provider]    Allergies Patient has no known allergies.  Family History  Problem Relation Age of Onset  . Heart disease Mother   . Heart disease Father     Social History Social History   Tobacco Use  . Smoking status: Former Smoker    Packs/day: 2.00    Types: Cigarettes    Quit date: 2017    Years since quitting: 4.9  . Smokeless tobacco: Never Used  Vaping Use  . Vaping Use: Every day  . Devices: rare use  Substance  Use Topics  . Alcohol use: No  . Drug use: No    Review of Systems Constitutional: No fever/chills Eyes: No visual changes. ENT: No sore throat. Cardiovascular: Denies chest pain. Respiratory: Denies shortness of breath. Gastrointestinal: See HPI Genitourinary: Negative for dysuria. Musculoskeletal: Negative for back pain. Skin: Negative for rash. Neurological: Negative for headaches    ____________________________________________   PHYSICAL EXAM:  VITAL SIGNS: ED Triage Vitals  Enc Vitals  Group     BP 03/14/20 0732 (!) 143/106     Pulse Rate 03/14/20 0732 (!) 110     Resp 03/14/20 0732 14     Temp 03/14/20 0732 97.8 F (36.6 C)     Temp src --      SpO2 03/14/20 0732 100 %     Weight --      Height --      Head Circumference --      Peak Flow --      Pain Score 03/14/20 0733 10     Pain Loc --      Pain Edu? --      Excl. in GC? --     Constitutional: Alert and oriented. Well appearing and in no acute distress.  Sitting up, does report mild to moderate pain in the suprapubic area just below his ventral scar Eyes: Conjunctivae are normal. Head: Atraumatic. Nose: No congestion/rhinnorhea. Mouth/Throat: Mucous membranes are moist. Neck: No stridor.  Cardiovascular: Normal rate, regular rhythm. Grossly normal heart sounds.  Good peripheral circulation. Respiratory: Normal respiratory effort.  No retractions. Lungs CTAB. Gastrointestinal: Soft and moderately tender over ventral suprapubic scar, there is evident hernia present and it feels relatively soft but he does report tenderness in the region.  No distention. Musculoskeletal: No lower extremity tenderness nor edema. Neurologic:  Normal speech and language. No gross focal neurologic deficits are appreciated.  Skin:  Skin is warm, dry and intact. No rash noted. Psychiatric: Mood and affect are normal. Speech and behavior are normal.  ____________________________________________   LABS (all labs ordered are listed, but only abnormal results are displayed)  Labs Reviewed  CBC - Abnormal; Notable for the following components:      Result Value   WBC 15.0 (*)    All other components within normal limits  BASIC METABOLIC PANEL - Abnormal; Notable for the following components:   CO2 21 (*)    Glucose, Bld 113 (*)    All other components within normal limits  HEPATIC FUNCTION PANEL - Abnormal; Notable for the following components:   Total Protein 8.7 (*)    All other components within normal limits  LACTIC  ACID, PLASMA  LIPASE, BLOOD  LACTIC ACID, PLASMA   ____________________________________________  EKG   ____________________________________________  RADIOLOGY  DG Abdomen 1 View  Result Date: 03/14/2020 CLINICAL DATA:  Abdominal pain.  History of small-bowel obstruction. EXAM: ABDOMEN - 1 VIEW COMPARISON:  02/12/2019 CT FINDINGS: Two supine views. Cholecystectomy clips. No gaseous distention of bowel loops. Clear lung bases. Mild right hemidiaphragm elevation. Presumed phleboliths in the pelvis. IMPRESSION: No acute findings. Electronically Signed   By: Jeronimo GreavesKyle  Talbot M.D.   On: 03/14/2020 09:34   CT ABDOMEN PELVIS W CONTRAST  Result Date: 03/14/2020 CLINICAL DATA:  Periumbilical pain since yesterday. History of multiple small bowel obstructions and surgeries. EXAM: CT ABDOMEN AND PELVIS WITH CONTRAST TECHNIQUE: Multidetector CT imaging of the abdomen and pelvis was performed using the standard protocol following bolus administration of intravenous contrast. CONTRAST:  100mL OMNIPAQUE IOHEXOL 300  MG/ML  SOLN COMPARISON:  02/12/2019 and plain films of earlier today. FINDINGS: Lower chest: clear lung bases. Normal heart size without pericardial or pleural effusion. Hepatobiliary: Moderate hepatic steatosis. Cholecystectomy, without biliary ductal dilatation. Pancreas: Normal, without mass or ductal dilatation. Spleen: Normal in size, without focal abnormality. Adrenals/Urinary Tract: Normal adrenal glands. Normal kidneys, without hydronephrosis. Normal urinary bladder. Stomach/Bowel: Gastric body underdistention. Normal caliber of the colon, which is primarily fluid-filled. Normal terminal ileum and appendix. Pelvic small bowel loops are thickened with mild mucosal hyperenhancement, including on 74/2. Surrounding mesenteric edema and interloop fluid. The small bowel just distal to this is borderline to mildly dilated, including at 2.8 cm on 62/2. This small bowel dilatation continues to the level of  enterotomy sutures. Adjacent to the enterotomy sutures is an area of relative hyperattenuation including at 1.4 cm on 54/2. This area is not imaged on kidney delays. Vascular/Lymphatic: Aortic atherosclerosis. No abdominopelvic adenopathy. Reproductive: Normal prostate. Other: No pneumatosis or free intraperitoneal air. Musculoskeletal: No acute osseous abnormality. IMPRESSION: 1. Pelvic small bowel wall thickening and mucosal hyperenhancement with surrounding mesenteric edema, suspicious for enteritis. 2. Small bowel dilatation just distal to this area, continuing to the level of a small bowel surgical sutures. Cannot exclude concurrent low-grade partial small bowel obstruction, presumably secondary to adhesions. 3. Focus of hyperattenuation within the small bowel, immediately adjacent to the enterotomy site. Although this could simply represent enteric contents, a small-bowel lesion cannot be excluded. Consider follow-up with CT enterography at 2-3 weeks to confirm resolution of enteritis and exclude small-bowel lesion in this area. 4. Hepatic steatosis. 5.  Aortic Atherosclerosis (ICD10-I70.0). Electronically Signed   By: Jeronimo Greaves M.D.   On: 03/14/2020 12:03     Imaging results as above, discussed with Dr. Everlene Farrier ____________________________________________   PROCEDURES  Procedure(s) performed: None  Procedures  Critical Care performed: No  ____________________________________________   INITIAL IMPRESSION / ASSESSMENT AND PLAN / ED COURSE  Pertinent labs & imaging results that were available during my care of the patient were reviewed by me and considered in my medical decision making (see chart for details).   Differential diagnosis includes but is not limited to, abdominal perforation, aortic dissection, cholecystitis, appendicitis, diverticulitis, colitis, esophagitis/gastritis, kidney stone, pyelonephritis, urinary tract infection, aortic aneurysm. All are considered in decision and  treatment plan. Based upon the patient's presentation and risk factors, we will proceed with CT imaging.  Patient does report his symptoms are feeling improved and has had these symptoms off and on multiple times during his lifetime often due to bowel obstructions that will come and go intermittently due to adhesions.  I suspect same today.  No fever.  He does however have leukocytosis mild tachycardia.  ----------------------------------------- 1:17 PM on 03/14/2020 -----------------------------------------  General surgery Dr. Everlene Farrier came to the ER and saw the patient in consult.  Advising that patient does not wish to stay for admission and given symptoms improving he does not need surgery at this point.   I discussed with the patient and recommended he be observed or admitted for pain control and to rule out worsening obstruction.  The patient however declined this stating it is Christmas and he has plans with his family and he is feeling improved this is happened multiple times and he would like to go home with his family.  He will return if his symptoms return or worsen.  I think this is reasonable, but given his situation and complex medical history I discussed with the patient my strong recommendation  was for him to be admitted.  Patient is understanding agreeable with plan to leave AGAINST MEDICAL ADVICE return at any time, he reports he will return right away if he has further vomiting worsening pain or new concerns arise.  He was offered care and treatment including further work-up and admission to the hospital but at this point declined stating strong desire to spend time with his family and improvement in his symptoms.  Return precautions and treatment recommendations and follow-up discussed with the patient who is agreeable with the plan.        ____________________________________________   FINAL CLINICAL IMPRESSION(S) / ED DIAGNOSES  Final diagnoses:  Lower abdominal pain   Small bowel obstruction, partial (HCC)        Note:  This document was prepared using Dragon voice recognition software and may include unintentional dictation errors       Sharyn Creamer, MD 03/14/20 1319

## 2020-03-14 NOTE — Consult Note (Signed)
Patient ID: [name], male   DOB: 02-Sep-1970, 49 y.o.   MRN: [id number]  HPI [name] is a 49 y.o. male pain in consultation at the request of Dr. Fanny Bien.  Case discussed with him in detail.  He is known to me with prior history of recurrent small bowel obstruction as well as bilateral inguinal hernias.  His surgical history is significant for cholecystectomy in 2004, sigmoid colectomy in 2011 complicated by anastomotic leak requiring diverting loop ileostomy and a small bowel resection.  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  He subsequently underwent a loop ileostomy takedown. He has had recurrent small bowel obstructions and I have seen him in the past for recurrent small bowel obstruction.  Dr. Allegra Lai from GI has also seen him and work-up recommended including repeat upper endoscopy and enterography. Patient apparently cancel the upper GI. He comes in with 1 day history of abdominal pain nausea and vomiting.  He reports that the pain is colicky type intermittent.  He is passing some gas.  He says that he did not even take the p.o. contrast on the CT scan because it causes severe nausea.  He denies any fevers any chills.  He did have a CT scan that I have personally reviewed showing evidence of dilated loops of small bowel consistent with ileitis versus partial small bowel obstruction.  There is no free air there is no pneumatosis White count is 15,000 and that is significant increase from baseline at 6.  BMP shows a normal creatinine with a slightly elevated BUN of 14 and mild acidosis. He was also scheduled for a open left inguinal hernia by me and he canceled the procedure.   HPI  Past Medical History:  Diagnosis Date  . Collagen vascular disease (HCC)   . Diverticulitis   . History of hiatal hernia   . Small bowel obstruction [facility])     Past Surgical History:  Procedure Laterality Date  . arm surgery    . CHOLECYSTECTOMY    . COLON RESECTION    . COLON SURGERY   2011   small bowel resection  . COLONOSCOPY WITH PROPOFOL N/A 03/05/2019   Procedure: COLONOSCOPY WITH PROPOFOL;  Surgeon: Toney Reil, MD;  Location: [facility] ENDOSCOPY;  Service: Gastroenterology;  Laterality: N/A;  . ESOPHAGOGASTRODUODENOSCOPY (EGD) WITH PROPOFOL N/A 03/05/2019   Procedure: ESOPHAGOGASTRODUODENOSCOPY (EGD) WITH PROPOFOL;  Surgeon: Toney Reil, MD;  Location: [facility] ENDOSCOPY;  Service: Gastroenterology;  Laterality: N/A;  . HEMORRHOID SURGERY N/A 01/16/2019   Procedure: HEMORRHOIDECTOMY;  Surgeon: Leafy Ro, MD;  Location: [facility] ORS;  Service: General;  Laterality: N/A;  . ileostomy takedown  2011  . loop ileostomy and repair of anastomotic leak at sigmoid colon  2011    Family History  Problem Relation Age of Onset  . Heart disease Mother   . Heart disease Father     Social History Social History   Tobacco Use  . Smoking status: Former Smoker    Packs/day: 2.00    Types: Cigarettes    Quit date: 2017    Years since quitting: 4.9  . Smokeless tobacco: Never Used  Vaping Use  . Vaping Use: Every day  . Devices: rare use  Substance Use Topics  . Alcohol use: No  . Drug use: No    No Known Allergies  No current facility-administered medications for this encounter.   Current Outpatient Medications  Medication Sig Dispense Refill  . dicyclomine (BENTYL) 20  MG tablet TAKE 1 TABLET (20 MG TOTAL) BY MOUTH 3 (THREE) TIMES DAILY BEFORE MEALS. 90 tablet 0  . HYDROcodone-acetaminophen (NORCO/VICODIN) 5-325 MG tablet Take 1 tablet by mouth every 4 (four) hours as needed. (Patient not taking: Reported on 12/20/2019)    . omeprazole (PRILOSEC) 40 MG capsule Take 1 capsule (40 mg total) by mouth daily before breakfast. 90 capsule 0  . vitamin B-12 (CYANOCOBALAMIN) 1000 MCG tablet Take 1,000 mcg by mouth daily.       Review of Systems Full ROS  was asked and was negative except for the information on the HPI  Physical Exam Blood pressure (!)  143/106, pulse (!) 110, temperature 97.8 F (36.6 C), resp. rate 14, SpO2 100 %. CONSTITUTIONAL: NAD EYES: Pupils are equal, round, Sclera are non-icteric. EARS, NOSE, MOUTH AND THROAT: He is wearing a mask. Hearing is intact to voice. LYMPH NODES:  Lymph nodes in the neck are normal. RESPIRATORY:  Lungs are clear. There is normal respiratory effort, with equal breath sounds bilaterally, and without pathologic use of accessory muscles. CARDIOVASCULAR: Heart is regular without murmurs, gallops, or rubs. GI: The abdomen is  soft, tender there is prior laparotomy scars.  There is evidence of bilateral inguinal hernias that are reducible.  There is no peritonitis  GU: Rectal deferred.   MUSCULOSKELETAL: Normal muscle strength and tone. No cyanosis or edema.   SKIN: Turgor is good and there are no pathologic skin lesions or ulcers. NEUROLOGIC: Motor and sensation is grossly normal. Cranial nerves are grossly intact. PSYCH:  Oriented to person, place and time. Affect is normal.  Data Reviewed  I have personally reviewed the patient's imaging, laboratory findings and medical records.    Assessment/Plan 49 year old male with recurrent small bowel obstructions multiple laparotomies in the past include bowel resection and sigmoid resection.  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  Recommend IV hydration n.p.o. serial abdominal exams.  Patient seems not to be interested in admission to the hospital. Also discussed with him the importance of following up as an outpatient and following up different imaging studies to include enterography.  He was supposed to complete an upper endoscopy last week but did not done.  No need for surgical intervention at this time.  Discussed with Dr. Fanny Bien in detail  Sterling Big, MD FACS General Surgeon 03/14/2020, 12:54 PM

## 2020-03-14 NOTE — ED Triage Notes (Signed)
Pt presents via EMS c/o abd pain since last PM. Pt reports hx of multiple SBOs and surgeries to bowel including colectomy per pt report. Pt reports severe pain with N/V. Reports feels like he may "rupture."

## 2020-03-14 NOTE — ED Notes (Signed)
E-signature not working at this time. Pt verbalized understanding of D/C instructions, prescriptions and follow up care with no further questions at this time. Pt in NAD and ambulatory at time of D/C.  

## 2020-03-14 NOTE — ED Triage Notes (Signed)
Pt comes into the ED via EMS from home with c/o lower abd pain with N/V.Marland Kitchena hx of several intestinal surgies in the past,

## 2020-03-14 NOTE — ED Notes (Signed)
Patient transported to X-ray 

## 2020-04-07 ENCOUNTER — Ambulatory Visit: Payer: Self-pay | Admitting: Surgery

## 2020-04-14 ENCOUNTER — Encounter: Payer: Self-pay | Admitting: Surgery

## 2020-04-14 ENCOUNTER — Ambulatory Visit (INDEPENDENT_AMBULATORY_CARE_PROVIDER_SITE_OTHER): Payer: BC Managed Care – PPO | Admitting: Surgery

## 2020-04-14 ENCOUNTER — Other Ambulatory Visit: Payer: Self-pay

## 2020-04-14 VITALS — BP 129/86 | HR 71 | Temp 97.8°F | Ht 72.0 in | Wt 180.8 lb

## 2020-04-14 DIAGNOSIS — R109 Unspecified abdominal pain: Secondary | ICD-10-CM | POA: Diagnosis not present

## 2020-04-14 DIAGNOSIS — K56609 Unspecified intestinal obstruction, unspecified as to partial versus complete obstruction: Secondary | ICD-10-CM

## 2020-04-14 NOTE — Patient Instructions (Addendum)
Referral was sent to Pain Clinic at today's visit. Patient will follow up with our office in 3-4 months after having appointment with Pain Clinic.  Bowel Obstruction A bowel obstruction means that something is blocking the small or large bowel. The bowel is also called the intestine. It is the long tube that connects the stomach to the opening of the butt (anus). When something blocks the bowel, food and fluids cannot pass through like normal. This condition needs to be treated. Treatment depends on the cause of the problem and how bad the problem is. What are the causes? Common causes of this condition include:  Scar tissue (adhesions) from past surgery or from high-energy X-rays (radiation).  Recent surgery in the belly. This affects how food moves in the bowel.  Some diseases, such as: ? Irritation of the lining of the digestive tract (Crohn's disease). ? Irritation of small pouches in the bowel (diverticulitis).  Growths or tumors.  A bulging organ (hernia).  Twisting of the bowel (volvulus).  A foreign body.  Slipping of a part of the bowel into another part (intussusception).   What are the signs or symptoms? Symptoms of this condition include:  Pain in the belly.  Feeling sick to your stomach (nauseous).  Throwing up (vomiting).  Bloating in the belly.  Being unable to pass gas.  Trouble pooping (constipation).  Watery poop (diarrhea).  A lot of belching. How is this diagnosed? This condition may be diagnosed based on:  A physical exam.  Medical history.  Imaging tests, such as X-ray or CT scan.  Blood tests.  Urine tests. How is this treated? Treatment for this condition may include:  Fluids and pain medicines that are given through an IV tube. Your doctor may tell you not to eat or drink if you feel sick to your stomach and are throwing up.  Eating a clear liquid diet for a few days.  Putting a small tube (nasogastric tube) into the stomach. This  will help with pain, discomfort, and nausea by removing blocked air and fluids from the stomach.  Surgery. This may be needed if other treatments do not work. Follow these instructions at home: Medicines  Take over-the-counter and prescription medicines only as told by your doctor.  If you were prescribed an antibiotic medicine, take it as told by your doctor. Do not stop taking the antibiotic even if you start to feel better. General instructions  Follow your diet as told by your doctor. You may need to: ? Only drink clear liquids until you start to get better. ? Avoid solid foods.  Return to your normal activities as told by your doctor. Ask your doctor what activities are safe for you.  Do not sit for a long time without moving. Get up to take short walks every 1-2 hours. This is important. Ask for help if you feel weak or unsteady.  Keep all follow-up visits as told by your doctor. This is important. How is this prevented? After having a bowel obstruction, you may be more likely to have another. You can do some things to stop it from happening again.  If you have a long-term (chronic) disease, contact your doctor if you see changes or problems.  Take steps to prevent or treat trouble pooping. Your doctor may ask that you: ? Drink enough fluid to keep your pee (urine) pale yellow. ? Take over-the-counter or prescription medicines. ? Eat foods that are high in fiber. These include beans, whole grains, and fresh  fruits and vegetables. ? Limit foods that are high in fat and sugar. These include fried or sweet foods.  Stay active. Ask your doctor which exercises are safe for you.  Avoid stress.  Eat three small meals and three small snacks each day.  Work with a Psychologist, prison and probation services (dietitian) to make a meal plan that works for you.  Do not use any products that contain nicotine or tobacco, such as cigarettes and e-cigarettes. If you need help quitting, ask your doctor.   Contact a  doctor if:  You have a fever.  You have chills. Get help right away if:  You have pain or cramps that get worse.  You throw up blood.  You are sick to your stomach.  You cannot stop throwing up.  You cannot drink fluids.  You feel mixed up (confused).  You feel very thirsty (dehydrated).  Your belly gets more bloated.  You feel weak or you pass out (faint). Summary  A bowel obstruction means that something is blocking the small or large bowel.  Treatment may include IV fluids and pain medicine. You may also have a clear liquid diet, a small tube in your stomach, or surgery.  Drink clear liquids and avoid solid foods until you get better. This information is not intended to replace advice given to you by your health care provider. Make sure you discuss any questions you have with your health care provider. Document Revised: 07/20/2017 Document Reviewed: 07/20/2017 Elsevier Patient Education  2021 [employer].

## 2020-04-16 NOTE — Progress Notes (Signed)
Outpatient Surgical Follow Up  04/16/2020  [name] is an 50 y.o. male.   Chief Complaint  Patient presents with  . Follow-up    Hospital follow up:  w/small bowel obstruction (03/14/20)    HPI: [name] is a 50 year old male with a complex surgical history requiring multiple interventions to include cholecystectomy, SB resection, colectomy and  ileostomy and hernia repair.  Now comes in after recent episode of partial small bowel obstruction that resolved on its own.  He does have a chronic history of multiple GI issues and chronic abdominal pain.  He did have work-up including upper and lower scopes by Dr. Allegra Lai and was on Bentyl for a while. I think at some point time a GI doctor told him that he has Crohn's disease but he saw Dr. Allegra Lai and I do not think that this diagnosis has been confirmed. Complains of some intermittent discomfort constipation sometimes diarrhea.  All oh his symptoms are functional. I have personally reviewed the CT scan showing evidence of a small left inguinal hernia with no evidence of incisional hernias.  Please note that I have personally reviewed the scan and showed the images to him  Past Medical History:  Diagnosis Date  . Collagen vascular disease (HCC)   . Diverticulitis   . History of hiatal hernia   . Small bowel obstruction [facility])     Past Surgical History:  Procedure Laterality Date  . arm surgery    . CHOLECYSTECTOMY    . COLON RESECTION    . COLON SURGERY  2011   small bowel resection  . COLONOSCOPY WITH PROPOFOL N/A 03/05/2019   Procedure: COLONOSCOPY WITH PROPOFOL;  Surgeon: Toney Reil, MD;  Location: [facility] ENDOSCOPY;  Service: Gastroenterology;  Laterality: N/A;  . ESOPHAGOGASTRODUODENOSCOPY (EGD) WITH PROPOFOL N/A 03/05/2019   Procedure: ESOPHAGOGASTRODUODENOSCOPY (EGD) WITH PROPOFOL;  Surgeon: Toney Reil, MD;  Location: [facility] ENDOSCOPY;  Service: Gastroenterology;  Laterality: N/A;  . HEMORRHOID SURGERY N/A 01/16/2019    Procedure: HEMORRHOIDECTOMY;  Surgeon: Leafy Ro, MD;  Location: [facility] ORS;  Service: General;  Laterality: N/A;  . ileostomy takedown  2011  . loop ileostomy and repair of anastomotic leak at sigmoid colon  2011    Family History  Problem Relation Age of Onset  . Heart disease Mother   . Heart disease Father     Social History:  reports that he quit smoking about 5 years ago. His smoking use included cigarettes. He smoked 2.00 packs per day. He has never used smokeless tobacco. He reports that he does not drink alcohol and does not use drugs.  Allergies: No Known Allergies  Medications reviewed.    ROS Full ROS performed and is otherwise negative other than what is stated in HPI   BP 129/86   Pulse 71   Temp 97.8 F (36.6 C) (Oral)   Ht 6' (1.829 m)   Wt 180 lb 12.8 oz (82 kg)   SpO2 98%   BMI 24.52 kg/m   Physical Exam Vitals and nursing note reviewed. Exam conducted with a chaperone present.  Constitutional:      General: He is not in acute distress.    Appearance: Normal appearance.  Pulmonary:     Effort: Pulmonary effort is normal. No respiratory distress.     Breath sounds: No stridor.  Abdominal:     General: Abdomen is flat. There is no distension.     Palpations: Abdomen is soft. There is no mass.  Tenderness: There is no abdominal tenderness. There is no guarding or rebound.     Hernia: A hernia is present.     Comments: Reducible small left inguinal hernia.   Musculoskeletal:        General: Normal range of motion.     Cervical back: Normal range of motion and neck supple. No rigidity.  Skin:    General: Skin is warm and dry.     Capillary Refill: Capillary refill takes less than 2 seconds.  Neurological:     General: No focal deficit present.     Mental Status: He is alert and oriented to person, place, and time.  Psychiatric:        Mood and Affect: Mood normal.        Behavior: Behavior normal.        Thought Content: Thought content  normal.        Judgment: Judgment normal.    Assessment/Plan: [name] is a 50 year old male with a complex surgical history requiring multiple interventions to include cholecystectomy, SB resection, colectomy and  ileostomy and hernia repair.  Now comes in with persistent abdominal pain and recurrent small bowel obstructions.  On exam he does have evidence of a left inguinal hernia.  On CT scan I do not see any definitive incisional hernias and on exam today I am unable to confirm any other ventral hernias. He seems to be interested in chronic management of abdominal pain.  We will make appropriate arrangement for pain specialist.  No need for hospitalization or intervention at this time.  Difficult situation he wishes to come back once he has his functional GI issues sorted.  I do think that at some point time he will benefit from an open left inguinal hernia repair given that his abdomen is very hostile and an anterior approach might be the best way to go.  On the right side there may be hernia on the CT scan but is currently not symptomatic and I would not necessarily repair that side unless he becomes symptomatic. Greater than 50% of the 40 minutes  visit was spent in counseling/coordination of care   Sterling Big, MD [facility] General Surgeon

## 2020-06-24 ENCOUNTER — Ambulatory Visit
Payer: BC Managed Care – PPO | Attending: Student in an Organized Health Care Education/Training Program | Admitting: Student in an Organized Health Care Education/Training Program

## 2020-06-24 ENCOUNTER — Other Ambulatory Visit: Payer: Self-pay

## 2020-06-24 ENCOUNTER — Encounter: Payer: Self-pay | Admitting: Student in an Organized Health Care Education/Training Program

## 2020-06-24 VITALS — BP 140/83 | HR 64 | Temp 97.0°F | Resp 16 | Ht 72.0 in | Wt 180.0 lb

## 2020-06-24 DIAGNOSIS — Z9889 Other specified postprocedural states: Secondary | ICD-10-CM | POA: Diagnosis present

## 2020-06-24 DIAGNOSIS — R1084 Generalized abdominal pain: Secondary | ICD-10-CM | POA: Insufficient documentation

## 2020-06-24 DIAGNOSIS — G894 Chronic pain syndrome: Secondary | ICD-10-CM | POA: Insufficient documentation

## 2020-06-24 NOTE — Patient Instructions (Signed)
You will complete psych referral prior to next appt. with pain clinic.

## 2020-06-24 NOTE — Progress Notes (Signed)
Safety precautions to be maintained throughout the outpatient stay will include: orient to surroundings, keep bed in low position, maintain call bell within reach at all times, provide assistance with transfer out of bed and ambulation.  

## 2020-06-24 NOTE — Progress Notes (Signed)
Patient: [name]  Service Category: E/M  Provider: Gillis Santa, MD  DOB: 18-Apr-1970  DOS: 06/24/2020  Referring Provider: Jules Husbands, MD  MRN: [id number]  Setting: Ambulatory outpatient  PCP: Lorelee Market, MD  Type: New Patient  Specialty: Interventional Pain Management    Location: Office  Delivery: Face-to-face     Primary Reason(s) for Visit: Encounter for initial evaluation of one or more chronic problems (new to examiner) potentially causing chronic pain, and posing a threat to normal musculoskeletal function. (Level of risk: High) CC: Abdominal Pain (lower)  HPI  [name] is a 50 y.o. year old, male patient, who comes for the first time to our practice referred by Jules Husbands, MD for our initial evaluation of his chronic pain. He has Adult pyloric stenosis; Gastroesophageal reflux disease with esophagitis without hemorrhage; Schatzki's ring; Dyspepsia; History of abdominal surgery; Generalized abdominal pain; and Chronic pain syndrome on their problem list. Today he comes in for evaluation of his Abdominal Pain (lower)  Pain Assessment: Location: Lower Abdomen Radiating: denies Onset: More than a month ago Duration: Chronic pain Quality: Aching,Burning,Constant,Sharp,Shooting,Heaviness Severity: 7 /10 (subjective, self-reported pain score)  Effect on ADL: "more difficult to do everything, including breathing" Timing: Constant Modifying factors: hydrocodone, oxycodone BP: 140/83  HR: 64  Onset and Duration: Present longer than 3 months Cause of pain: small bowel obstruction Severity: No change since onset, NAS-11 at its worse: 10/10, NAS-11 at its best: 4/10, NAS-11 now: 7/10 and NAS-11 on the average: 6/10 Timing: Not influenced by the time of the day Aggravating Factors: Bending, Bowel movements, Climbing, Eating, Intercourse (sex), Kneeling, Lifiting, Motion, Prolonged sitting, Prolonged standing, Squatting, Stooping , Twisting, Walking, Walking uphill, Walking  downhill and Working Alleviating Factors: Medications Associated Problems: Pain that wakes patient up and Pain that does not allow patient to sleep Quality of Pain: Aching, Agonizing, Cruel, Deep, Disabling, Distressing, Dreadful, Punishing, Sharp, Shooting, Sickening, Splitting, Stabbing and Throbbing Previous Examinations or Tests: CT scan, Endoscopy and X-rays Previous Treatments: Narcotic medications  [name] is a 50 year old male with a complex surgical history requiring multiple interventions to include cholecystectomy, SB resection, colectomy and  ileostomy and hernia repair.  He states that he has visits to the emergency department for severe abdominal pain that usually comes on spontaneously.  He states that this pain is very debilitating and he is unable to function.  Of note patient does have small left inguinal hernia however surgery is not required at this time.  His general surgeon is Dr. Dahlia Byes.  He has tried NSAIDs in the past including ibuprofen, naproxen along with acetaminophen.  He is currently on Bentyl at this time.  He is requesting a small prescription of hydrocodone to be utilized only for severe breakthrough pain.  He does not plan on using this as part of his daily pain regimen.  I informed the patient or protocol regarding chronic opioid therapy.  We will complete a baseline urine toxicology screen and I will send the patient for a psychological risk assessment for substance use disorder prior to initiating chronic opioid therapy specifically for breakthrough abdominal pain given multiple abdominal surgeries and associated leaky gut syndrome.  He is currently not on any opioid analgesics at this time.  Historic Controlled Substance Pharmacotherapy Review  Historical Monitoring: The patient  reports no history of drug use. List of all UDS Test(s): No results found for: MDMA, COCAINSCRNUR, [place], [place], CANNABQUANT, [place], [place] List of other Serum/Urine Drug Screening  Test(s):  No results  found for: AMPHSCRSER, BARBSCRSER, BENZOSCRSER, COCAINSCRSER, COCAINSCRNUR, PCPSCRSER, PCPQUANT, THCSCRSER, THCU, CANNABQUANT, OPIATESCRSER, OXYSCRSER, PROPOXSCRSER, ETH Historical Background Evaluation: [place] PMP: PDMP not reviewed this encounter. Online review of the past 24-monthperiod conducted.             [place] Department of public safety, offender search: (Editor, commissioningInformation) Non-contributory Risk Assessment Profile: Aberrant behavior: None observed or detected today Risk factors for fatal opioid overdose: age [age]years old Fatal overdose hazard ratio (HR): Calculation deferred Non-fatal overdose hazard ratio (HR): Calculation deferred Risk of opioid abuse or dependence: 0.7-3.0% with doses ? 36 MME/day and 6.1-26% with doses ? 120 MME/day. Substance use disorder (SUD) risk level: See below Personal History of Substance Abuse (SUD-Substance use disorder):  Alcohol: Negative  Illegal Drugs: Negative  Rx Drugs: Negative  ORT Risk Level calculation: Low Risk  Opioid Risk Tool - 06/24/20 1435      Family History of Substance Abuse   Alcohol Negative    Illegal Drugs Negative    Rx Drugs Negative      Personal History of Substance Abuse   Alcohol Negative    Illegal Drugs Negative    Rx Drugs Negative      Age   Age between 173-45years  No      History of Preadolescent Sexual Abuse   History of Preadolescent Sexual Abuse Negative or Male      Psychological Disease   Psychological Disease Negative    Depression Negative      Total Score   Opioid Risk Tool Scoring 0    Opioid Risk Interpretation Low Risk          ORT Scoring interpretation table:  Score <3 = Low Risk for SUD  Score between 4-7 = Moderate Risk for SUD  Score >8 = High Risk for Opioid Abuse   PHQ-2 Depression Scale:  Total score:    PHQ-2 Scoring interpretation table: (Score and probability of major depressive disorder)  Score 0 = No depression  Score 1 = 15.4% Probability   Score 2 = 21.1% Probability  Score 3 = 38.4% Probability  Score 4 = 45.5% Probability  Score 5 = 56.4% Probability  Score 6 = 78.6% Probability   PHQ-9 Depression Scale:  Total score:    PHQ-9 Scoring interpretation table:  Score 0-4 = No depression  Score 5-9 = Mild depression  Score 10-14 = Moderate depression  Score 15-19 = Moderately severe depression  Score 20-27 = Severe depression (2.4 times higher risk of SUD and 2.89 times higher risk of overuse)   Pharmacologic Plan: As per protocol, I have not taken over any controlled substance management, pending the results of ordered tests and/or consults.            Initial impression: Pending review of available data and ordered tests.  Meds   Current Outpatient Medications:  .  dicyclomine (BENTYL) 20 MG tablet, TAKE 1 TABLET (20 MG TOTAL) BY MOUTH 3 (THREE) TIMES DAILY BEFORE MEALS., Disp: 90 tablet, Rfl: 0 .  omeprazole (PRILOSEC) 40 MG capsule, Take 1 capsule (40 mg total) by mouth daily before breakfast., Disp: 90 capsule, Rfl: 0 .  vitamin B-12 (CYANOCOBALAMIN) 1000 MCG tablet, Take 1,000 mcg by mouth daily., Disp: , Rfl:  .  HYDROcodone-acetaminophen (NORCO/VICODIN) 5-325 MG tablet, Take 1 tablet by mouth every 4 (four) hours as needed. (Patient not taking: No sig reported), Disp: , Rfl:   Imaging Review    Complexity Note: Imaging results reviewed. Results shared with  [name], using Layman's terms.                         ROS  Cardiovascular: Abnormal heart rhythm Pulmonary or Respiratory: Snoring  Neurological: No reported neurological signs or symptoms such as seizures, abnormal skin sensations, urinary and/or fecal incontinence, being born with an abnormal open spine and/or a tethered spinal cord Psychological-Psychiatric: No reported psychological or psychiatric signs or symptoms such as difficulty sleeping, anxiety, depression, delusions or hallucinations (schizophrenial), mood swings (bipolar disorders) or  suicidal ideations or attempts Gastrointestinal: Irregular, infrequent bowel movements (Constipation) Genitourinary: No reported renal or genitourinary signs or symptoms such as difficulty voiding or producing urine, peeing blood, non-functioning kidney, kidney stones, difficulty emptying the bladder, difficulty controlling the flow of urine, or chronic kidney disease Hematological: No reported hematological signs or symptoms such as prolonged bleeding, low or poor functioning platelets, bruising or bleeding easily, hereditary bleeding problems, low energy levels due to low hemoglobin or being anemic Endocrine: No reported endocrine signs or symptoms such as high or low blood sugar, rapid heart rate due to high thyroid levels, obesity or weight gain due to slow thyroid or thyroid disease Rheumatologic: No reported rheumatological signs and symptoms such as fatigue, joint pain, tenderness, swelling, redness, heat, stiffness, decreased range of motion, with or without associated rash Musculoskeletal: Negative for myasthenia gravis, muscular dystrophy, multiple sclerosis or malignant hyperthermia Work History: Working full time  Allergies  Mr. Jaco has No Known Allergies.  Laboratory Chemistry Profile   Renal Lab Results  Component Value Date   BUN 14 03/14/2020   CREATININE 1.18 03/14/2020   BCR 10 12/20/2019   GFRAA 79 12/20/2019   GFRNONAA >60 03/14/2020   PROTEINUR NEGATIVE 12/02/2014     Electrolytes Lab Results  Component Value Date   NA 136 03/14/2020   K 3.6 03/14/2020   CL 102 03/14/2020   CALCIUM 9.7 03/14/2020   MG 2.0 12/02/2014   PHOS 2.4 (L) 12/02/2014     Hepatic Lab Results  Component Value Date   AST 23 03/14/2020   ALT 17 03/14/2020   ALBUMIN 4.3 03/14/2020   ALKPHOS 61 03/14/2020   AMYLASE 49 09/02/2007   LIPASE 24 03/14/2020     ID Lab Results  Component Value Date   HIV Non Reactive 03/16/2017   SARSCOV2NAA NEGATIVE 03/27/2019   STAPHAUREUS  NEGATIVE 01/15/2019   MRSAPCR NEGATIVE 01/15/2019     Bone Lab Results  Component Value Date   VD25OH 24.9 (L) 12/20/2019     Endocrine Lab Results  Component Value Date   GLUCOSE 113 (H) 03/14/2020   GLUCOSEU NEGATIVE 12/02/2014     Neuropathy Lab Results  Component Value Date   VITAMINB12 1,226 12/20/2019   FOLATE 5.7 12/20/2019   HIV Non Reactive 03/16/2017     CNS No results found for: COLORCSF, APPEARCSF, RBCCOUNTCSF, WBCCSF, POLYSCSF, LYMPHSCSF, EOSCSF, PROTEINCSF, GLUCCSF, JCVIRUS, CSFOLI, IGGCSF, LABACHR, ACETBL, LABACHR, ACETBL   Inflammation (CRP: Acute  ESR: Chronic) Lab Results  Component Value Date   CRP <1 02/28/2019   LATICACIDVEN 1.8 03/14/2020     Rheumatology No results found for: RF, ANA, LABURIC, URICUR, LYMEIGGIGMAB, LYMEABIGMQN, HLAB27   Coagulation Lab Results  Component Value Date   INR 1.0 01/12/2019   LABPROT 13.0 01/12/2019   PLT 349 03/14/2020     Cardiovascular Lab Results  Component Value Date   HGB 14.6 03/14/2020   HCT 43.7 03/14/2020     Screening Lab Results  Component Value Date   SARSCOV2NAA NEGATIVE 03/27/2019   STAPHAUREUS NEGATIVE 01/15/2019   MRSAPCR NEGATIVE 01/15/2019   HIV Non Reactive 03/16/2017     Cancer No results found for: CEA, CA125, LABCA2   Allergens No results found for: ALMOND, APPLE, ASPARAGUS, AVOCADO, BANANA, BARLEY, BASIL, BAYLE[place], GREENBEAN, LIMABEAN, WHITEBEAN, BEEFIGE, REDBEET, BLUEBERRY, BROCCOLI, CABBAGE, MELON, CARROT, CASEIN, CASHEWNUT, CAULIFLOWER, CELERY     Note: Lab results reviewed.  [place]  Drug: [name]  reports no history of drug use. Alcohol:  reports no history of alcohol use. Tobacco:  reports that he quit smoking about 5 years ago. His smoking use included cigarettes. He smoked 2.00 packs per day. He has never used smokeless tobacco. Medical:  has a past medical history of Collagen vascular disease ([place]), Diverticulitis, History of hiatal hernia, and Small bowel  obstruction ([place]). Family: family history includes Heart disease in his father and mother.  Past Surgical History:  Procedure Laterality Date  . arm surgery    . CHOLECYSTECTOMY    . COLON RESECTION    . COLON SURGERY  2011   small bowel resection  . COLONOSCOPY WITH PROPOFOL N/A 03/05/2019   Procedure: COLONOSCOPY WITH PROPOFOL;  Surgeon: Lin Landsman, MD;  Location[facility] ENDOSCOPY;  Service: Gastroenterology;  Laterality: N/A;  . ESOPHAGOGASTRODUODENOSCOPY (EGD) WITH PROPOFOL N/A 03/05/2019   Procedure: ESOPHAGOGASTRODUODENOSCOPY (EGD) WITH PROPOFOL;  Surgeon: Lin Landsman, MD;  Location: [facility] ENDOSCOPY;  Service: Gastroenterology;  Laterality: N/A;  . HEMORRHOID SURGERY N/A 01/16/2019   Procedure: HEMORRHOIDECTOMY;  Surgeon: Jules Husbands, MD;  Location: [facility] ORS;  Service: General;  Laterality: N/A;  . ileostomy takedown  2011  . loop ileostomy and repair of anastomotic leak at sigmoid colon  2011   Active Ambulatory Problems    Diagnosis Date Noted  . Adult pyloric stenosis   . Gastroesophageal reflux disease with esophagitis without hemorrhage   . Schatzki's ring   . Dyspepsia   . History of abdominal surgery 06/24/2020  . Generalized abdominal pain 06/24/2020  . Chronic pain syndrome 06/24/2020   Resolved Ambulatory Problems    Diagnosis Date Noted  . Bowel obstruction ([place]) 12/02/2014  . SBO (small bowel obstruction) ([place]) 12/02/2014  . Hypokalemia 12/02/2014  . Strangulated hemorrhoids 01/15/2019  . Colon cancer screening    Past Medical History:  Diagnosis Date  . Collagen vascular disease ([place])   . Diverticulitis   . History of hiatal hernia   . Small bowel obstruction ([place])    Constitutional Exam  General appearance: Well nourished, well developed, and well hydrated. In no apparent acute distress Vitals:   06/24/20 1419  BP: 140/83  Pulse: 64  Resp: 16  Temp: (!) 97 F (36.1 C)  TempSrc: Temporal  SpO2: 98%  Weight: 180 lb (81.6 kg)   Height: 6' (1.829 m)   BMI Assessment: Estimated body mass index is 24.41 kg/m as calculated from the following:   Height as of this encounter: 6' (1.829 m).   Weight as of this encounter: 180 lb (81.6 kg).  BMI interpretation table: BMI level Category Range association with higher incidence of chronic pain  <18 kg/m2 Underweight   18.5-24.9 kg/m2 Ideal body weight   25-29.9 kg/m2 Overweight Increased incidence by 20%  30-34.9 kg/m2 Obese (Class I) Increased incidence by 68%  35-39.9 kg/m2 Severe obesity (Class II) Increased incidence by 136%  >40 kg/m2 Extreme obesity (Class III) Increased incidence by 254%   Patient's current BMI Ideal Body weight  Body mass index  is 24.41 kg/m. Ideal body weight: 77.6 kg (171 lb 1.2 oz) Adjusted ideal body weight: 79.2 kg (174 lb 10.3 oz)   BMI Readings from Last 4 Encounters:  06/24/20 24.41 kg/m  04/14/20 24.52 kg/m  12/20/19 24.63 kg/m  07/16/19 24.89 kg/m   Wt Readings from Last 4 Encounters:  06/24/20 180 lb (81.6 kg)  04/14/20 180 lb 12.8 oz (82 kg)  12/20/19 181 lb 9.6 oz (82.4 kg)  07/16/19 183 lb 8 oz (83.2 kg)    Psych/Mental status: Alert, oriented x 3 (person, place, & time)       Eyes: PERLA Respiratory: No increased work of breathing  Abdomen: Surgical scar present.  Guarding observed.  Tender to palpation along right and left lower quadrant.  Abdomen is slightly distended  5 out of 5 strength bilateral upper extremity: Shoulder abduction, elbow flexion, elbow extension, thumb extension. 5 out of 5 strength bilateral lower extremity: Plantar flexion, dorsiflexion, knee flexion, knee extension. Normal gait  Assessment  Primary Diagnosis & Pertinent Problem List: The primary encounter diagnosis was Chronic pain syndrome. Diagnoses of Generalized abdominal pain and History of abdominal surgery were also pertinent to this visit.  Visit Diagnosis (New problems to examiner): 1. Chronic pain syndrome   2. Generalized  abdominal pain   3. History of abdominal surgery    Plan of Care (Initial workup plan)  Note: [name] was reminded that as per protocol, today's visit has been an evaluation only. We have not taken over the patient's controlled substance management.  1.  Urine toxicology screen. 2.  Referral to Dr Shea Evans for SUD 3.  If urine tox screen and psych assessment is appropriate, I informed patient that we could consider a small quantity of hydrocodone anywhere from quantity 10 to 15/month to be utilized only for breakthrough pain as this prevent him going to the ED when he has a pain flare. 4.  Also discussed transversus abdominis plane block under ultrasound guidance to anesthetize the anterior cutaneous and lateral cutaneous nerves innervating the abdominal wall.  Patient is somewhat reluctant to move forward with this.  Can consider in future if he has worsening pain.   Lab Orders     Compliance Drug Analysis, Ur  Referral Orders     Ambulatory referral to Psychiatry   Pharmacological management options:  Opioid Analgesics: The patient was informed that there is no guarantee that he would be a candidate for opioid analgesics. The decision will be made following CDC guidelines. This decision will be based on the results of diagnostic studies, as well as [name] risk profile.   Membrane stabilizer: Tried and failed  Muscle relaxant: Tried and failed  NSAID: Not indicated  Other analgesic(s): To be determined at a later time   Interventional management options: [name] was informed that there is no guarantee that he would be a candidate for interventional therapies. The decision will be based on the results of diagnostic studies, as well as [name] risk profile.  Procedure(s) under consideration:  Transverse abdominis plane block under ultrasound guidance   Provider-requested follow-up: Return for patient will call for 2nd visit after he has completed psych eval.  No future  appointments.  Note by: Gillis Santa, MD Date: 06/24/2020; Time: 3:08 PM

## 2020-06-27 LAB — COMPLIANCE DRUG ANALYSIS, UR

## 2020-07-28 ENCOUNTER — Telehealth: Payer: Self-pay | Admitting: Gastroenterology

## 2020-07-28 DIAGNOSIS — R109 Unspecified abdominal pain: Secondary | ICD-10-CM

## 2020-07-28 DIAGNOSIS — R140 Abdominal distension (gaseous): Secondary | ICD-10-CM

## 2020-07-28 DIAGNOSIS — K219 Gastro-esophageal reflux disease without esophagitis: Secondary | ICD-10-CM

## 2020-07-28 DIAGNOSIS — R1084 Generalized abdominal pain: Secondary | ICD-10-CM

## 2020-07-28 MED ORDER — OMEPRAZOLE 40 MG PO CPDR: 40.0000 mg | DELAYED_RELEASE_CAPSULE | Freq: Every day | ORAL | 0 refills | Status: DC

## 2020-07-28 MED ORDER — DICYCLOMINE HCL 20 MG PO TABS: 20.0000 mg | ORAL_TABLET | Freq: Three times a day (TID) | ORAL | 0 refills | Status: DC

## 2020-07-28 NOTE — Telephone Encounter (Signed)
dicyclomine (BENTYL) 20 MG tablet [[id number]] ENDED    Medication omeprazole (PRILOSEC) 40 MG capsule [[id number]]   omeprazole (PRILOSEC) 40 MG capsule [[id number]] ENDED     Patient called requesting medication refill sent to [facility]  90 days

## 2020-07-28 NOTE — Telephone Encounter (Signed)
Last office visit 12/20/2019 Last refill Dicyclomine 20mg  06/12/2019 0  Omeprazole 0610/21 0 refills  Patient was supposed to follow up in 3 months  Made appointment for 08/26/2020

## 2020-08-06 ENCOUNTER — Ambulatory Visit: Payer: 59 | Admitting: Surgery

## 2020-08-26 ENCOUNTER — Other Ambulatory Visit: Payer: Self-pay

## 2020-08-26 ENCOUNTER — Encounter: Payer: Self-pay | Admitting: Gastroenterology

## 2020-08-26 ENCOUNTER — Ambulatory Visit (INDEPENDENT_AMBULATORY_CARE_PROVIDER_SITE_OTHER): Payer: BC Managed Care – PPO | Admitting: Gastroenterology

## 2020-08-26 VITALS — BP 141/91 | HR 62 | Temp 98.3°F | Ht 72.0 in | Wt 171.1 lb

## 2020-08-26 DIAGNOSIS — R790 Abnormal level of blood mineral: Secondary | ICD-10-CM | POA: Diagnosis not present

## 2020-08-26 DIAGNOSIS — K529 Noninfective gastroenteritis and colitis, unspecified: Secondary | ICD-10-CM

## 2020-08-26 DIAGNOSIS — K311 Adult hypertrophic pyloric stenosis: Secondary | ICD-10-CM | POA: Diagnosis not present

## 2020-08-26 DIAGNOSIS — K5730 Diverticulosis of large intestine without perforation or abscess without bleeding: Secondary | ICD-10-CM | POA: Insufficient documentation

## 2020-08-26 DIAGNOSIS — R634 Abnormal weight loss: Secondary | ICD-10-CM

## 2020-08-26 DIAGNOSIS — Z8719 Personal history of other diseases of the digestive system: Secondary | ICD-10-CM

## 2020-08-26 NOTE — Progress Notes (Signed)
[name], MD [address]  [address]  [place], [place] [address]  Main: [phone]  Fax: [phone]     Gastroenterology Consultation  Referring Provider:     [name], MD Primary Care Physician:  [name], MD Primary Gastroenterologist:  Dr. [name] Reason for Consultation: Abdominal pain, h/o sbo         HPI:   [name] is a 50 y.o. male referred by Dr. [name], MD  for consultation & management of chronic abdominal pain and recurrent partial SBO.  For last 4-5 years, patient has been experiencing recurrent episodes of severe abdominal pain in different locations, associated with abdominal bloating, nausea and vomiting followed by relief of abdominal pain.  He had multiple ER visits as well as several admissions secondary to partial small bowel obstructions ever since he had complicated surgery in 2011.  Most recently, admitted to [facility] on 01/15/2019 secondary to severe anorectal pain, found to have strangulated internal hemorrhoids, underwent hemorrhoidectomy of the left lateral hemorrhoids by Dr. Everlene Farrier.  Patient is referred to [facility] by Dr. Everlene Farrier for further management of abdominal pain and history of partial SBO and to evaluate for possible Crohn's.  Patient is discharged home on dicyclomine and he thinks that the only medication which has helped him so far.  Today, he does not have active abdominal symptoms since admission.  However, he continues to have mild to moderate flareups at least 2-3 times a month.  He has adjusted his diet to minimize his symptoms.  Cannot tolerate high roughage foods at all.  His weight fluctuates but overall stable within a certain range.  He does not have anemia.  He does report fatigue.  His bowel movements fluctuate from 2-3 times a day to 3 to 4 days without a BM associated with significant straining.  He denies rectal bleeding at this time.  He thinks he is recovering well from hemorrhoidectomy.   He denies smoking or alcohol use.  He works for [employer], in [place]  Patient has been followed by a gastroenterologist, Dr. [name] in [place] until recently.  Patient tells me that at some point he was told he has Crohn's disease.  Patient had multiple GI surgeries as listed below Hemorrhoidectomy in 2001 Cholecystectomy in 12/2002 Sigmoid colectomy on 07/19/2009 complicated by anastomotic leak of the sigmoid colon with abscesses and fistula resulting in repair of the anastomotic leak, diverting loop ileostomy and small bowel resection in 09/08/2009 Takedown of loop ileostomy 11/20/2009 Hemorrhoidectomy 01/16/2019  Follow-up visit 04/09/2019 Since last visit, patient underwent EGD and colonoscopy and feels significantly better.  He continues to take Bentyl 20 mg as needed which provides significant relief of his abdominal pain and bloating.  He had severe vitamin D deficiency, currently taking 50,000 units weekly.  He reports that his energy levels have improved, gained few pounds. Patient finished 2 weeks course of rifaximin which he thinks has modestly helped  Follow-up visit 07/16/2019 Patient reports that omeprazole and Bentyl have been helping with his GI symptoms.  He also reports abdominal discomfort in the epigastric region associated with early satiety, fullness and bloating.  He had pork chops yesterday and reports that the symptoms are worse today.  He has history of mild erosive esophagitis and continues to take omeprazole 40 mg daily.  His weight has been stable.  He is taking B12 supplements daily  Follow-up visit 12/20/2019 Patient reports ongoing epigastric pain associated with nausea, severe bloating, the symptoms  are predominantly postprandial.  He reports burning pain in the retrosternal area as well.  Currently taking omeprazole 40 mg daily before breakfast.  He does consume a can of carbonated beverage and ice tea every day.  He reports his bowel movements are  somewhat hard and feels gassy all the time.  He has not tried any stool softener.  Follow-up visit 08/26/2020 Patient lost about 9 pounds since last visit.  He said he eliminated ice tea.  He is down to half a can of soda daily.  He reports feeling better overall.  He does have alternating episodes of diarrhea and 2 to 3 days without a BM.  Last night, he had 2-3 runs of diarrhea followed by abdominal cramps.  He is taking omeprazole 40 mg daily.  NSAIDs: None  Antiplts/Anticoagulants/Anti thrombotics: None  GI Procedures: EGD and colonoscopy 03/05/2019  - Normal duodenal bulb and second portion of the duodenum. Biopsied. 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 - Small hiatal hernia. - Normal stomach. Biopsied. - Esophagogastric landmarks identified. - Widely patent Schatzki ring. - LA Grade A reflux esophagitis with no bleeding. - Normal esophagus. Biopsied.  - The examined portion of the ileum was normal. - Patent functional end-to-end colo-colonic anastomosis, characterized by healthy appearing mucosa. - Normal mucosa in the entire examined colon. - Non-bleeding internal hemorrhoids. - No specimens collected.  DIAGNOSIS:  A. DUODENUM; COLD BIOPSY:  - UNREMARKABLE DUODENAL MUCOSA.  - NEGATIVE FOR FEATURES OF CELIAC DISEASE, DYSPLASIA, AND MALIGNANCY.   B. STOMACH, RANDOM; COLD BIOPSY:  - ANTRAL MUCOSA WITH FOCAL REACTIVE GASTRITIS.  - UNREMARKABLE OXYNTIC MUCOSA.  - NEGATIVE FOR H. PYLORI, DYSPLASIA, AND MALIGNANCY.   C. ESOPHAGUS; COLD BIOPSY:  - UNREMARKABLE SQUAMOUS MUCOSA.  - NEGATIVE FOR INTRAEPITHELIAL EOSINOPHILS, DYSPLASIA, AND MALIGNANCY.  Colonoscopy 11/11/1999 I. SMALL BOWEL, ILEUM BIOPSY: UNREMARKABLE SMALL BOWEL MUCOSA.  NO VILLOUS ATROPHY, INFLAMMATION OR OTHER ABNORMALITIES PRESENT.   II. COLON, BIOPSY: UNREMARKABLE COLONIC MUCOSA. NO SIGNIFICANT  INFLAMMATION OR OTHER ABNORMALITIES IDENTIFIED.   Colonoscopy 11/29/2000 I. SMALL BOWEL,  ILEUM BIOPSY: UNREMARKABLE SMALL BOWEL MUCOSA.  NO VILLOUS ATROPHY, INFLAMMATION OR OTHER ABNORMALITIES PRESENT.   II. COLON, BIOPSY: UNREMARKABLE COLONIC MUCOSA. NO SIGNIFICANT  INFLAMMATION OR OTHER ABNORMALITIES IDENTIFIED.   Colonoscopy 02/28/2004 COLON, BIOPSY: NO ACTIVE COLITIS OR DYSPLASIA IDENTIFIED. SEE  COMMENT (BIOPSIES, RECTOSIGMOID)   COMMENT  There is benign colorectal mucosa in which there is mild to  moderate chronic inflammation within the lamina propria. At the  deep edge of one of the fragments there is a superficial aspect  of a mucosal lymphoid aggregate. This could conceivable account  for the nodularity noted colonoscopically. There is a rare gland  that contains a neutrophil but otherwise no active inflammation  is seen and the findings are non-specific. (JAS:gt) 03/02/04  Past Medical History:  Diagnosis Date  . Collagen vascular disease (HCC)   . Diverticulitis   . History of hiatal hernia   . Small bowel obstruction [facility])     Past Surgical History:  Procedure Laterality Date  . arm surgery    . CHOLECYSTECTOMY    . COLON RESECTION    . COLON SURGERY  2011   small bowel resection  . COLONOSCOPY WITH PROPOFOL N/A 03/05/2019   Procedure: COLONOSCOPY WITH PROPOFOL;  Surgeon: Toney Reil, MD;  Location: [facility] ENDOSCOPY;  Service: Gastroenterology;  Laterality: N/A;  . ESOPHAGOGASTRODUODENOSCOPY (EGD) WITH PROPOFOL N/A 03/05/2019   Procedure: ESOPHAGOGASTRODUODENOSCOPY (EGD) WITH PROPOFOL;  Surgeon: Toney Reil, MD;  Location: [facility] ENDOSCOPY;  Service: Gastroenterology;  Laterality: N/A;  . HEMORRHOID SURGERY N/A 01/16/2019   Procedure: HEMORRHOIDECTOMY;  Surgeon: Leafy RoPabon, Diego F, MD;  Location: [facility] ORS;  Service: General;  Laterality: N/A;  . ileostomy takedown  2011  . loop ileostomy and repair of anastomotic leak at sigmoid colon  2011    Current Outpatient Medications:  .  dicyclomine (BENTYL) 20 MG tablet, Take 1 tablet  (20 mg total) by mouth 3 (three) times daily before meals., Disp: 90 tablet, Rfl: 0 .  omeprazole (PRILOSEC) 40 MG capsule, Take 1 capsule (40 mg total) by mouth daily before breakfast., Disp: 90 capsule, Rfl: 0 .  vitamin B-12 (CYANOCOBALAMIN) 1000 MCG tablet, Take 1,000 mcg by mouth daily., Disp: , Rfl:    Family History  Problem Relation Age of Onset  . Heart disease Mother   . Heart disease Father      Social History   Tobacco Use  . Smoking status: Former Smoker    Packs/day: 2.00    Types: Cigarettes    Quit date: 2017    Years since quitting: 5.4  . Smokeless tobacco: Never Used  Vaping Use  . Vaping Use: Former  . Devices: rare use  Substance Use Topics  . Alcohol use: No  . Drug use: No    Allergies as of 08/26/2020  . (No Known Allergies)    Review of Systems:    All systems reviewed and negative except where noted in HPI.   Physical Exam:  BP (!) 141/91 (BP Location: Left Arm, Patient Position: Sitting, Cuff Size: Normal)   Pulse 62   Temp 98.3 F (36.8 C) (Oral)   Ht 6' (1.829 m)   Wt 171 lb 2 oz (77.6 kg)   BMI 23.21 kg/m  No LMP for male patient.  General:   Alert,  Well-developed, well-nourished, pleasant and cooperative in NAD Head:  Normocephalic and atraumatic. Eyes:  Sclera clear, no icterus.   Conjunctiva pink. Ears:  Normal auditory acuity. Nose:  No deformity, discharge, or lesions. Mouth:  No deformity or lesions,oropharynx pink & moist. Neck:  Supple; no masses or thyromegaly. Lungs:  Respirations even and unlabored.  Clear throughout to auscultation.   No wheezes, crackles, or rhonchi. No acute distress. Heart:  Regular rate and rhythm; no murmurs, clicks, rubs, or gallops. Abdomen:  Normal bowel sounds. Soft, non-tender and mildly distended, tympanic to percussion, several old healed scars from prior surgeries without masses, hepatosplenomegaly or hernias noted.  No guarding or rebound tenderness.   Rectal: Not performed Msk:   Symmetrical without gross deformities. Good, equal movement & strength bilaterally. Pulses:  Normal pulses noted. Extremities:  No clubbing or edema.  No cyanosis. Neurologic:  Alert and oriented x3;  grossly normal neurologically. Skin:  Intact without significant lesions or rashes. No jaundice. Psych:  Alert and cooperative. Normal mood and affect.  Imaging Studies: Reviewed  Assessment and Plan:   [name] is a 50 y.o. male with history of sigmoid diverticulitis s/p sigmoid colectomy in 2011 complicated by anastomotic leak with abscesses and fistula, s/p diverting loop ileostomy, repair of fistula, and takedown in 11/2009 with chronic intermittent episodes of abdominal pain associated with abdominal bloating, nausea and vomiting consistent with recurrent partial small bowel obstructions.  Patient had several colonoscopies in the past with no evidence of inflammatory bowel disease including surgical specimens.  His bowel obstructions are most likely secondary to adhesions.  EGD revealed pyloric stenosis which was dilated.  Duodenal, gastric  and esophageal biopsies were unremarkable. Colonoscopy was normal Abdominal pain with bloating has fairly improved since last visit  Epigastric pain, bloating and early satiety and loose stools, weight loss Patient has history of pyloric stenosis, s/p EGD with balloon dilation dilation to 20 mm in 02/2019.  Esophageal, gastric and duodenal biopsies were unremarkable Continue omeprazole to 40 mg daily Recommend pancreatic fecal elastase levels  History of small bowel obstructions Fecal calprotectin levels are normal, colonoscopy with TI evaluation was normal in 02/2019.  No evidence of Crohn's disease or ulcerative colitis.  Patient did not undergo MR enterography which was ordered.  History of B12 deficiency, resolved No evidence of anemia Did have history of iron deficiency, recheck iron levels today  Follow up in 3 months   [name],  MD

## 2020-08-27 ENCOUNTER — Other Ambulatory Visit: Payer: Self-pay

## 2020-08-27 ENCOUNTER — Telehealth: Payer: Self-pay

## 2020-08-27 LAB — IRON,TIBC AND FERRITIN PANEL
Ferritin: 9 ng/mL — ABNORMAL LOW (ref 30–400)
Iron Saturation: 8 % — CL (ref 15–55)
Iron: 29 ug/dL — ABNORMAL LOW (ref 38–169)
Total Iron Binding Capacity: 347 ug/dL (ref 250–450)
UIBC: 318 ug/dL (ref 111–343)

## 2020-08-27 MED ORDER — FUSION PLUS PO CAPS: 1.0000 | ORAL_CAPSULE | Freq: Once | ORAL | 3 refills | Status: DC

## 2020-08-27 MED ORDER — FUSION PLUS PO CAPS: 1.0000 | ORAL_CAPSULE | Freq: Once | ORAL | 3 refills | Status: AC

## 2020-08-27 NOTE — Progress Notes (Signed)
States [facility] is out of the medication and wants it to be sent to [facility] now

## 2020-08-27 NOTE — Telephone Encounter (Signed)
-----   Message from Toney Reil, MD sent at 08/27/2020  2:22 PM EDT ----- Patient has iron deficiency without evidence of anemia.  Recommend trial of fusion plus 1 pill a day for 3 months  RV

## 2020-08-27 NOTE — Telephone Encounter (Signed)
Patient verbalized understanding of results he will go pick up the medication

## 2020-09-01 ENCOUNTER — Other Ambulatory Visit: Payer: Self-pay

## 2020-09-01 ENCOUNTER — Encounter: Payer: Self-pay | Admitting: Psychiatry

## 2020-09-01 ENCOUNTER — Telehealth (INDEPENDENT_AMBULATORY_CARE_PROVIDER_SITE_OTHER): Payer: BC Managed Care – PPO | Admitting: Psychiatry

## 2020-09-01 DIAGNOSIS — Z008 Encounter for other general examination: Secondary | ICD-10-CM

## 2020-09-01 DIAGNOSIS — G894 Chronic pain syndrome: Secondary | ICD-10-CM | POA: Diagnosis not present

## 2020-09-01 NOTE — Progress Notes (Signed)
Virtual Visit via Video Note  I connected with [name] on 09/01/20 at  9:00 AM EDT by a video enabled telemedicine application and verified that I am speaking with the correct person using two identifiers.  Location Provider Location : [facility] Patient Location : Home  Participants: Patient , Provider   I discussed the limitations of evaluation and management by telemedicine and the availability of in person appointments. The patient expressed understanding and agreed to proceed.    I discussed the assessment and treatment plan with the patient. The patient was provided an opportunity to ask questions and all were answered. The patient agreed with the plan and demonstrated an understanding of the instructions.   The patient was advised to call back or seek an in-person evaluation if the symptoms worsen or if the condition fails to improve as anticipated.    Psychiatric Initial Adult Assessment   Patient Identification: [name] MRN:  [id number] Date of Evaluation:  09/01/2020 Referral Source: Dr.Bilal Lateef Chief Complaint:   Chief Complaint   Psychiatric Evaluation; Pain    Visit Diagnosis:    ICD-10-CM   1. Evaluation by psychiatric service required  Z00.8     2. Chronic pain syndrome  G89.4       History of Present Illness:  [name] is a 50 year old divorced Caucasian male who is employed, currently lives in [place] has a history of pyloric stenosis, gastroesophageal reflux disease, partial small bowel obstruction, complicated surgery in 2011, multiple GI surgeries in the past including ileostomy, small bowel resection in 2011, anastomotic leak of sigmoid colon, takedown of loop ileostomy, hemorrhoidectomy 2020, generalized abdominal pain, chronic pain syndrome was evaluated by telemedicine today.  Patient was referred to the clinic for routine assessment of possible mental health/substance abuse risk potential prior to initiation of pain management by his pain  provider.  Patient reports he has a history of multiple abdominal surgeries ,he is in a lot of pain all the time.  He reports his pain can fluctuate anywhere between 5-8 out of 10, 10 being the worst throughout the day.  Patient reports any kind of movement including walking, going up the stairs, prolonged sitting, kneeling, lifting can affect his pain, and can exacerbate it.  He reports the pain does have an impact on his functioning at work.  It does affect his concentration.  Patient reports he however continues to work. There has been days when his pain is too agonizing ,when he is unable to work and may have to call out of work.  He reports his work as very understanding and supportive.  Patient reports his pain does have an impact on his energy level.  He also struggles with anemia and is currently undergoing treatment for that which may also have an impact on his energy level during the day.  Patient reports sleep is overall okay.  Patient denies any significant depressive symptoms.  Denies any suicidality, homicidality or perceptual disturbances.  Denies any anxiety or panic attacks.  Denies any perceptual disturbances.  Patient denies any suicidality, homicidality.  Patient reports he is religious.  Patient reports he takes it one day at a time and that helps him to move forward.  He has good social support from his girlfriend as well as his adult sons.  Patient denies any substance abuse problems.   Associated Signs/Symptoms: Depression Symptoms:  fatigue, difficulty concentrating, (Hypo) Manic Symptoms:   Denies Anxiety Symptoms:   Denies Psychotic Symptoms:   Denies PTSD Symptoms:  Denies  Past Psychiatric History: Patient denies past history of psychiatric problems.  Denies inpatient mental health admissions.  Denies suicide attempts.  Previous Psychotropic Medications:  Denies  Substance Abuse History in the last 12 months:  Denies  Consequences of Substance  Abuse: Negative  Past Medical History:  Past Medical History:  Diagnosis Date   Abdominal pain 2011   Collagen vascular disease (HCC)    Diverticulitis    History of hiatal hernia    Small bowel obstruction (HCC)     Past Surgical History:  Procedure Laterality Date   arm surgery     CHOLECYSTECTOMY     COLON RESECTION     COLON SURGERY  2011   small bowel resection   COLONOSCOPY WITH PROPOFOL N/A 03/05/2019   Procedure: COLONOSCOPY WITH PROPOFOL;  Surgeon: Toney ReilVanga, Rohini Reddy, MD;  Location: [facility] ENDOSCOPY;  Service: Gastroenterology;  Laterality: N/A;   ESOPHAGOGASTRODUODENOSCOPY (EGD) WITH PROPOFOL N/A 03/05/2019   Procedure: ESOPHAGOGASTRODUODENOSCOPY (EGD) WITH PROPOFOL;  Surgeon: Toney ReilVanga, Rohini Reddy, MD;  [facility];  Service: Gastroenterology;  Laterality: N/A;   HEMORRHOID SURGERY N/A 01/16/2019   Procedure: HEMORRHOIDECTOMY;  Surgeon: Leafy RoPabon, Diego F, MD;  Location: [facility] ORS;  Service: General;  Laterality: N/A;   ileostomy takedown  2011   loop ileostomy and repair of anastomotic leak at sigmoid colon  2011    Family Psychiatric History: Patient denies history of mental health problems in his family.  Family History:  Family History  Problem Relation Age of Onset   Heart disease Mother    Heart disease Father    Mental illness Neg Hx     Social History:   Social History   Socioeconomic History   Marital status: Divorced    Spouse name: [name]   Number of children: Not on file   Years of education: Not on file   Highest education level: Not on file  Occupational History   Occupation: boss at work  Tobacco Use   Smoking status: Former    Packs/day: 2.00    Pack years: 0.00    Types: Cigarettes    Quit date: 2017    Years since quitting: 5.4   Smokeless tobacco: Never  Vaping Use   Vaping Use: Former   Devices: rare use  Substance and Sexual Activity   Alcohol use: No   Drug use: No   Sexual activity: Yes  Other Topics Concern   Not  on file  Social History Narrative   Not on file   Social Determinants of Health   Financial Resource Strain: Not on file  Food Insecurity: Not on file  Transportation Needs: Not on file  Physical Activity: Not on file  Stress: Not on file  Social Connections: Not on file    Additional Social History: Patient was [place].  He was raised by both his parents.  He had a good childhood.  Patient reports he has 1 older sister.  He has a good relationship with her.  He graduated high school.  Patient reports he went to college and got a degree in automotive.  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  He currently works for [employer] called [employer], reports work is going well.  He was married for 20 years however got divorced.  He has a 52 year old and a 52 year old son.  He  is currently in a relationship with his girlfriend which is going well.  Patient denies any history of legal problems.  He denies being in the [employer].  Patient is religious.  He currently lives in [place] by himself.  Allergies:  No Known Allergies  Metabolic Disorder Labs: No results found for: HGBA1C, MPG No results found for: PROLACTIN No results found for: CHOL, TRIG, HDL, CHOLHDL, VLDL, LDLCALC No results found for: TSH  Therapeutic Level Labs: No results found for: LITHIUM No results found for: CBMZ No results found for: VALPROATE  Current Medications: Current Outpatient Medications  Medication Sig Dispense Refill   cholecalciferol (VITAMIN D3) 25 MCG (1000 UNIT) tablet Take 1,000 Units by mouth daily.     dicyclomine (BENTYL) 20 MG tablet Take 1 tablet (20 mg total) by mouth 3 (three) times daily before meals. 90 tablet 0   Iron-FA-B Cmp-C-Biot-Probiotic (FUSION PLUS) CAPS Take 1 capsule by mouth once.     omeprazole (PRILOSEC) 40 MG capsule Take 1 capsule (40 mg  total) by mouth daily before breakfast. 90 capsule 0   vitamin B-12 (CYANOCOBALAMIN) 1000 MCG tablet Take 1,000 mcg by mouth daily.     No current facility-administered medications for this visit.    Musculoskeletal: Strength & Muscle Tone:  UTA Gait & Station: normal Patient leans: N/A  Psychiatric Specialty Exam: Review of Systems  Constitutional:  Positive for fatigue.  Gastrointestinal:  Positive for abdominal pain.  Psychiatric/Behavioral:  Negative for agitation, behavioral problems, confusion, decreased concentration, dysphoric mood, hallucinations, self-injury, sleep disturbance and suicidal ideas. The patient is not nervous/anxious and is not hyperactive.   All other systems reviewed and are negative.  There were no vitals taken for this visit.There is no height or weight on file to calculate BMI.  General Appearance: Casual  Eye Contact:  Good  Speech:  Clear and Coherent  Volume:  Normal  Mood:  Euthymic  Affect:  Congruent  Thought Process:  Goal Directed and Descriptions of Associations: Intact  Orientation:  Full (Time, Place, and Person)  Thought Content:  Logical  Suicidal Thoughts:  No  Homicidal Thoughts:  No  Memory:  Immediate;   Fair Recent;   Fair Remote;   Fair  Judgement:  Good  Insight:  Good  Psychomotor Activity:  Normal  Concentration:  Concentration: Fair and Attention Span: Fair  Recall:  [employer] of Knowledge:Fair  Language: Fair  Akathisia:  No  Handed:  Right  AIMS (if indicated):  not done  Assets:  Communication Skills Desire for Improvement Financial Resources/Insurance Housing Intimacy Resilience Social Support Talents/Skills Transportation Vocational/Educational  ADL's:  Intact  Cognition: WNL  Sleep:  Fair   Screenings: GAD-7    Flowsheet Row Video Visit [facility] 09/01/2020 in [facility]  Total GAD-7 Score 0      PHQ2-9    Flowsheet Row Video Visit from 09/01/2020 in [facility]  PHQ-2 Total Score 3  PHQ-9 Total Score 4      Flowsheet Row Video Visit from 09/01/2020 in [facility]  C-SSRS RISK CATEGORY No Risk       Assessment and Plan: [name] is a 50 year old Caucasian male, has a history of chronic pain syndrome, history of abdominal surgery, pyloric stenosis, gastroesophageal reflux disease, was evaluated by telemedicine today.  Patient was referred to the clinic for routine assessment of possible mental health/substance abuse risk potential prior to initiation of pain management by a pain provider. The patient demonstrates  the following risk factors for suicide: Chronic risk factors for suicide include: medical illness chronic abdominal pain, multiple complex abdominal surgeries and chronic pain. Acute risk factors for suicide include:  medical problems as noted above . Protective factors for this patient include: positive social support, positive therapeutic relationship, responsibility to others (children, family), coping skills, hope for the future, religious beliefs against suicide, and life satisfaction. Considering these factors, the overall suicide risk at this point appears to be low. Patient is appropriate for outpatient follow up.   Instruments used Clinical interview Screener and opioid assessment for patients with pain/revised Opioid risk tool Alcohol use disorder identification test Drug abuse screening test PHQ-9 GAD-7 C-SSRS  Based on clinical interview instrument used at the time of evaluation the risk is determined to be low.  This note was generated in part or whole with voice recognition software. Voice recognition is usually quite accurate but there are transcription errors that can and very often do occur. I apologize for any typographical errors that were not detected and corrected.      Jomarie Longs, MD 6/13/20229:37 AM

## 2020-09-02 ENCOUNTER — Other Ambulatory Visit: Payer: Self-pay | Admitting: Gastroenterology

## 2020-09-04 LAB — PANCREATIC ELASTASE, FECAL: Pancreatic Elastase, Fecal: 281 ug Elast./g (ref >200)

## 2020-09-05 ENCOUNTER — Encounter: Payer: Self-pay | Admitting: Gastroenterology

## 2020-09-09 ENCOUNTER — Other Ambulatory Visit: Payer: Self-pay

## 2020-09-09 ENCOUNTER — Ambulatory Visit
Payer: BC Managed Care – PPO | Attending: Student in an Organized Health Care Education/Training Program | Admitting: Student in an Organized Health Care Education/Training Program

## 2020-09-09 ENCOUNTER — Encounter: Payer: Self-pay | Admitting: Student in an Organized Health Care Education/Training Program

## 2020-09-09 VITALS — BP 135/93 | HR 67 | Temp 97.1°F | Resp 16 | Ht 72.0 in | Wt 175.0 lb

## 2020-09-09 DIAGNOSIS — Z0289 Encounter for other administrative examinations: Secondary | ICD-10-CM | POA: Insufficient documentation

## 2020-09-09 DIAGNOSIS — G894 Chronic pain syndrome: Secondary | ICD-10-CM | POA: Diagnosis present

## 2020-09-09 DIAGNOSIS — Z9889 Other specified postprocedural states: Secondary | ICD-10-CM | POA: Diagnosis not present

## 2020-09-09 DIAGNOSIS — R1084 Generalized abdominal pain: Secondary | ICD-10-CM

## 2020-09-09 MED ORDER — HYDROCODONE-ACETAMINOPHEN 5-325 MG PO TABS: 1.0000 | ORAL_TABLET | Freq: Every day | ORAL | 0 refills | Status: AC | PRN

## 2020-09-09 NOTE — Progress Notes (Signed)
Safety precautions to be maintained throughout the outpatient stay will include: orient to surroundings, keep bed in low position, maintain call bell within reach at all times, provide assistance with transfer out of bed and ambulation.  

## 2020-09-09 NOTE — Progress Notes (Signed)
PROVIDER NOTE: Information contained herein reflects review and annotations entered in association with encounter. Interpretation of such information and data should be left to medically-trained personnel. Information provided to patient can be located elsewhere in the medical record under "Patient Instructions". Document created using STT-dictation technology, any transcriptional errors that may result from process are unintentional.    Patient: [name]  Service Category: E/M  Provider: Gillis Santa, MD  DOB: 1970/03/25  DOS: 09/09/2020  Specialty: Interventional Pain Management  MRN: [id number]  Setting: Ambulatory outpatient  PCP: Lorelee Market, MD  Type: Established Patient    Referring Provider: Lorelee Market, MD  Location: Office  Delivery: Face-to-face     Primary Reason(s) for Visit: Encounter for evaluation before starting new chronic pain management plan of care (Level of risk: moderate) CC: Abdominal Pain (low)  HPI  [name] is a 50 y.o. year old, male patient, who comes today for a follow-up evaluation to review the test results and decide on a treatment plan. He has Adult pyloric stenosis; Gastroesophageal reflux disease with esophagitis without hemorrhage; Schatzki's ring; Dyspepsia; History of abdominal surgery; Generalized abdominal pain; Chronic pain syndrome; Diverticular disease of colon; and Pain management contract signed on their problem list. His primarily concern today is the Abdominal Pain (low)  Pain Assessment: Location: Lower Abdomen Radiating: radiates throughout abdomen Onset: More than a month ago Duration: Chronic pain Quality: Burning, Stabbing, Tingling Severity: 7 /10 (subjective, self-reported pain score)  Effect on ADL: limits activities Timing: Constant Modifying factors: medication BP: (!) 135/93  HR: 67  [name] comes in today for a follow-up visit after his initial evaluation on 06/24/2020.   -Completed psych eval -UDS up to  date -Patient informed of our pain clinic policy and given pain contract    06/24/20 [name] is a 50 year old male with a complex surgical history requiring multiple interventions to include cholecystectomy, SB resection, colectomy and  ileostomy and hernia repair.  He states that he has visits to the emergency department for severe abdominal pain that usually comes on spontaneously.  He states that this pain is very debilitating and he is unable to function.  Of note patient does have small left inguinal hernia however surgery is not required at this time.  His general surgeon is Dr. Dahlia Byes.  He has tried NSAIDs in the past including ibuprofen, naproxen along with acetaminophen.  He is currently on Bentyl at this time.  He is requesting a small prescription of hydrocodone to be utilized only for severe breakthrough pain.  He does not plan on using this as part of his daily pain regimen.   I informed the patient or protocol regarding chronic opioid therapy.  We will complete a baseline urine toxicology screen and I will send the patient for a psychological risk assessment for substance use disorder prior to initiating chronic opioid therapy specifically for breakthrough abdominal pain given multiple abdominal surgeries and associated leaky gut syndrome.  He is currently not on any opioid analgesics at this time.  Controlled Substance Pharmacotherapy Assessment REMS (Risk Evaluation and Mitigation Strategy)   Monitoring: [place] PMP: PDMP reviewed during this encounter. Online review of the past 69-monthperiod previously conducted. Not applicable at this point since we have not taken over the patient's medication management yet. List of other Serum/Urine Drug Screening Test(s):  No results found for: AMPHSCRSER, BARBSCRSER, BENZOSCRSER, COCAINSCRSER, COCAINSCRNUR, PCPSCRSER, THCSCRSER, THCU, CANNABQUANT, [place] of all UDS test(s) done:  Lab Results  Component Value  Date  SUMMARY Note 06/24/2020   Last UDS on record: Summary  Date Value Ref Range Status  06/24/2020 Note  Final    Comment:    ==================================================================== Compliance Drug Analysis, Ur ==================================================================== Test                             Result       Flag       Units  Drug Absent but Declared for Prescription Verification   Hydrocodone                    Not Detected UNEXPECTED ng/mg creat   Acetaminophen                  Not Detected UNEXPECTED    Acetaminophen, as indicated in the declared medication list, is not    always detected even when used as directed.  ==================================================================== Test                      Result    Flag   Units      Ref Range   Creatinine              164              mg/dL      >=20 ==================================================================== Declared Medications:  The flagging and interpretation on this report are based on the  following declared medications.  Unexpected results may arise from  inaccuracies in the declared medications.   **Note: The testing scope of this panel includes these medications:   Hydrocodone (Norco)   **Note: The testing scope of this panel does not include small to  moderate amounts of these reported medications:   Acetaminophen (Norco)   **Note: The testing scope of this panel does not include the  following reported medications:   Cyanocobalamin  Dicyclomine (Bentyl)  Omeprazole (Prilosec) ==================================================================== For clinical consultation, please call [phone]. ====================================================================    UDS interpretation: No unexpected findings.          Medication Assessment Form: Patient introduced to form today Treatment compliance: Treatment may start today if patient agrees with proposed  plan. Evaluation of compliance is not applicable at this point Risk Assessment Profile: Aberrant behavior: See initial evaluations. None observed or detected today Comorbid factors increasing risk of overdose: See initial evaluation. No additional risks detected today Opioid risk tool (ORT):  Opioid Risk  06/24/2020  Alcohol 0  Illegal Drugs 0  Rx Drugs 0  Alcohol 0  Illegal Drugs 0  Rx Drugs 0  Age between 16-45 years  0  History of Preadolescent Sexual Abuse 0  Psychological Disease 0  Depression 0  Opioid Risk Tool Scoring 0  Opioid Risk Interpretation Low Risk    ORT Scoring interpretation table:  Score <3 = Low Risk for SUD  Score between 4-7 = Moderate Risk for SUD  Score >8 = High Risk for Opioid Abuse   Risk of substance use disorder (SUD): Low-to-Moderate  Risk Mitigation Strategies:  Patient opioid safety counseling: Completed today. Counseling provided to patient as per "Patient Counseling Document". Document signed by patient, attesting to counseling and understanding Patient-Prescriber Agreement (PPA): Obtained today.  Controlled substance notification to other providers: Written and sent today.  Pharmacologic Plan: Today we may be taking over the patient's pharmacological regimen. See below.             Laboratory Chemistry Profile   Renal  Lab Results  Component Value Date   BUN 14 03/14/2020   CREATININE 1.18 03/14/2020   BCR 10 12/20/2019   GFRAA 79 12/20/2019   GFRNONAA >60 03/14/2020   PROTEINUR NEGATIVE 12/02/2014     Electrolytes Lab Results  Component Value Date   NA 136 03/14/2020   K 3.6 03/14/2020   CL 102 03/14/2020   CALCIUM 9.7 03/14/2020   MG 2.0 12/02/2014   PHOS 2.4 (L) 12/02/2014     Hepatic Lab Results  Component Value Date   AST 23 03/14/2020   ALT 17 03/14/2020   ALBUMIN 4.3 03/14/2020   ALKPHOS 61 03/14/2020   AMYLASE 49 09/02/2007   LIPASE 24 03/14/2020     ID Lab Results  Component Value Date   HIV Non Reactive  03/16/2017   SARSCOV2NAA NEGATIVE 03/27/2019   STAPHAUREUS NEGATIVE 01/15/2019   MRSAPCR NEGATIVE 01/15/2019     Bone Lab Results  Component Value Date   VD25OH 24.9 (L) 12/20/2019     Endocrine Lab Results  Component Value Date   GLUCOSE 113 (H) 03/14/2020   GLUCOSEU NEGATIVE 12/02/2014     Neuropathy Lab Results  Component Value Date   VITAMINB12 1,226 12/20/2019   FOLATE 5.7 12/20/2019   HIV Non Reactive 03/16/2017     CNS No results found for: COLORCSF, APPEARCSF, RBCCOUNTCSF, WBCCSF, POLYSCSF, LYMPHSCSF, EOSCSF, PROTEINCSF, GLUCCSF, JCVIRUS, CSFOLI, IGGCSF, LABACHR, ACETBL, LABACHR, ACETBL   Inflammation (CRP: Acute  ESR: Chronic) Lab Results  Component Value Date   CRP <1 02/28/2019   LATICACIDVEN 1.8 03/14/2020     Rheumatology No results found for: RF, ANA, LABURIC, URICUR, LYMEIGGIGMAB, LYMEABIGMQN, HLAB27   Coagulation Lab Results  Component Value Date   INR 1.0 01/12/2019   LABPROT 13.0 01/12/2019   PLT 349 03/14/2020     Cardiovascular Lab Results  Component Value Date   HGB 14.6 03/14/2020   HCT 43.7 03/14/2020     Screening Lab Results  Component Value Date   SARSCOV2NAA NEGATIVE 03/27/2019   STAPHAUREUS NEGATIVE 01/15/2019   MRSAPCR NEGATIVE 01/15/2019   HIV Non Reactive 03/16/2017     Cancer No results found for: CEA, CA125, LABCA2   Allergens No results found for: ALMOND, APPLE, ASPARAGUS, AVOCADO, BANANA, BARLEY, BASIL, BAYLEAF, GREENBEAN, LIMABEAN, WHITEBEAN, BEEFIGE, REDBEET, BLUEBERRY, BROCCOLI, CABBAGE, MELON, CARROT, CASEIN, CASHEWNUT, CAULIFLOWER, CELERY     Note: Lab results reviewed.  Recent Diagnostic Imaging Review   Meds   Current Outpatient Medications:    cholecalciferol (VITAMIN D3) 25 MCG (1000 UNIT) tablet, Take 1,000 Units by mouth daily., Disp: , Rfl:    dicyclomine (BENTYL) 20 MG tablet, Take 1 tablet (20 mg total) by mouth 3 (three) times daily before meals., Disp: 90 tablet, Rfl: 0    HYDROcodone-acetaminophen (NORCO) 5-325 MG tablet, Take 1 tablet by mouth daily as needed for moderate pain., Disp: 30 tablet, Rfl: 0   Iron-FA-B Cmp-C-Biot-Probiotic (FUSION PLUS) CAPS, Take 1 capsule by mouth once., Disp: , Rfl:    omeprazole (PRILOSEC) 40 MG capsule, Take 1 capsule (40 mg total) by mouth daily before breakfast., Disp: 90 capsule, Rfl: 0   vitamin B-12 (CYANOCOBALAMIN) 1000 MCG tablet, Take 1,000 mcg by mouth daily., Disp: , Rfl:   ROS  Constitutional: Denies any fever or chills Gastrointestinal: No reported hemesis, hematochezia, vomiting, or acute GI distress Musculoskeletal: Denies any acute onset joint swelling, redness, loss of ROM, or weakness Neurological: No reported episodes of acute onset apraxia, aphasia, dysarthria, agnosia, amnesia, paralysis, loss of coordination, or  loss of consciousness  Allergies  [name] has No Known Allergies.  [place]  Drug: [name]  reports no history of drug use. Alcohol:  reports no history of alcohol use. Tobacco:  reports that he quit smoking about 5 years ago. His smoking use included cigarettes. He smoked an average of 2.00 packs per day. He has never used smokeless tobacco. Medical:  has a past medical history of Abdominal pain (2011), Collagen vascular disease ([place]), Diverticulitis, History of hiatal hernia, and Small bowel obstruction ([place]). Surgical: [name]  has a past surgical history that includes Colon resection; Cholecystectomy; arm surgery; loop ileostomy and repair of anastomotic leak at sigmoid colon (2011); ileostomy takedown (2011); Hemorrhoid surgery (N/A, 01/16/2019); Colonoscopy with propofol (N/A, 03/05/2019); Esophagogastroduodenoscopy (egd) with propofol (N/A, 03/05/2019); and Colon surgery (2011). Family: family history includes Heart disease in his father and mother.  Constitutional Exam  General appearance: Well nourished, well developed, and well hydrated. In no apparent acute distress Vitals:    09/09/20 0833  BP: (!) 135/93  Pulse: 67  Resp: 16  Temp: (!) 97.1 F (36.2 C)  SpO2: 100%  Weight: 175 lb (79.4 kg)  Height: 6' (1.829 m)   BMI Assessment: Estimated body mass index is 23.73 kg/m as calculated from the following:   Height as of this encounter: 6' (1.829 m).   Weight as of this encounter: 175 lb (79.4 kg).  BMI interpretation table: BMI level Category Range association with higher incidence of chronic pain  <18 kg/m2 Underweight   18.5-24.9 kg/m2 Ideal body weight   25-29.9 kg/m2 Overweight Increased incidence by 20%  30-34.9 kg/m2 Obese (Class I) Increased incidence by 68%  35-39.9 kg/m2 Severe obesity (Class II) Increased incidence by 136%  >40 kg/m2 Extreme obesity (Class III) Increased incidence by 254%   Patient's current BMI Ideal Body weight  Body mass index is 23.73 kg/m. Ideal body weight: 77.6 kg (171 lb 1.2 oz) Adjusted ideal body weight: 78.3 kg (172 lb 10.3 oz)   BMI Readings from Last 4 Encounters:  09/09/20 23.73 kg/m  08/26/20 23.21 kg/m  06/24/20 24.41 kg/m  04/14/20 24.52 kg/m   Wt Readings from Last 4 Encounters:  09/09/20 175 lb (79.4 kg)  08/26/20 171 lb 2 oz (77.6 kg)  06/24/20 180 lb (81.6 kg)  04/14/20 180 lb 12.8 oz (82 kg)    Psych/Mental status: Alert, oriented x 3 (person, place, & time)       Eyes: PERLA Respiratory: No evidence of acute respiratory distress Abdomen: Surgical scar present.  Guarding observed.  Tender to palpation along right and left lower quadrant. Assessment & Plan  Primary Diagnosis & Pertinent Problem List: The primary encounter diagnosis was Chronic pain syndrome. Diagnoses of Generalized abdominal pain, History of abdominal surgery, and Pain management contract signed were also pertinent to this visit.  Visit Diagnosis: 1. Chronic pain syndrome   2. Generalized abdominal pain   3. History of abdominal surgery   4. Pain management contract signed    Problems updated and reviewed during this  visit: Problem  Pain Management Contract Signed   Plan of Care  Pharmacotherapy (Medications Ordered): Meds ordered this encounter  Medications   HYDROcodone-acetaminophen (NORCO) 5-325 MG tablet    Sig: Take 1 tablet by mouth daily as needed for moderate pain.    Dispense:  30 tablet    Refill:  0    Provider-requested follow-up: Return in about 11 weeks (around 11/25/2020) for Medication Management, in person. Recent Visits Date Type  Provider Dept  06/24/20 Office Visit Gillis Santa, MD [facility]  Showing recent visits within past 90 days and meeting all other requirements Today's Visits Date Type Provider Dept  09/09/20 Office Visit Gillis Santa, MD [facility]  Showing today's visits and meeting all other requirements Future Appointments Date Type Provider Dept  11/20/20 Appointment Gillis Santa, MD [facility]  Showing future appointments within next 90 days and meeting all other requirements Primary Care Physician: Lorelee Market, MD Note by: Gillis Santa, MD Date: 09/09/2020; Time: 9:08 AM

## 2020-09-10 ENCOUNTER — Telehealth: Payer: Self-pay

## 2020-09-10 NOTE — Telephone Encounter (Signed)
Pt needs prior auth for medications to be filled

## 2020-11-03 ENCOUNTER — Telehealth: Payer: Self-pay | Admitting: Gastroenterology

## 2020-11-03 DIAGNOSIS — K219 Gastro-esophageal reflux disease without esophagitis: Secondary | ICD-10-CM

## 2020-11-03 MED ORDER — OMEPRAZOLE 40 MG PO CPDR: 40.0000 mg | DELAYED_RELEASE_CAPSULE | Freq: Every day | ORAL | 0 refills | Status: DC

## 2020-11-03 NOTE — Telephone Encounter (Signed)
omeprazole (PRILOSEC) 40 MG capsule (Expired)  90 day refill  [facility] on [address].

## 2020-11-03 NOTE — Telephone Encounter (Signed)
Patient has appointment coming up with Dr. Allegra Lai sent medication to the pharmacy

## 2020-11-14 ENCOUNTER — Encounter: Payer: Self-pay | Admitting: Student in an Organized Health Care Education/Training Program

## 2020-11-17 ENCOUNTER — Ambulatory Visit
Payer: BC Managed Care – PPO | Attending: Student in an Organized Health Care Education/Training Program | Admitting: Student in an Organized Health Care Education/Training Program

## 2020-11-17 ENCOUNTER — Other Ambulatory Visit: Payer: Self-pay

## 2020-11-17 DIAGNOSIS — G894 Chronic pain syndrome: Secondary | ICD-10-CM | POA: Diagnosis not present

## 2020-11-17 DIAGNOSIS — R1084 Generalized abdominal pain: Secondary | ICD-10-CM

## 2020-11-17 DIAGNOSIS — Z0289 Encounter for other administrative examinations: Secondary | ICD-10-CM

## 2020-11-17 DIAGNOSIS — Z9889 Other specified postprocedural states: Secondary | ICD-10-CM | POA: Diagnosis not present

## 2020-11-17 MED ORDER — HYDROCODONE-ACETAMINOPHEN 5-325 MG PO TABS: 1.0000 | ORAL_TABLET | Freq: Every day | ORAL | 0 refills | Status: AC | PRN

## 2020-11-17 NOTE — Progress Notes (Signed)
Patient: [name]  Service Category: E/M  Provider: Gillis Santa, MD  DOB: 10-14-1970  DOS: 11/17/2020  Location: Office  MRN: [id number]  Setting: Ambulatory outpatient  Referring Provider: Lorelee Market, MD  Type: Established Patient  Specialty: Interventional Pain Management  PCP: Lorelee Market, MD  Location: Home  Delivery: TeleHealth     Virtual Encounter - Pain Management PROVIDER NOTE: Information contained herein reflects review and annotations entered in association with encounter. Interpretation of such information and data should be left to medically-trained personnel. Information provided to patient can be located elsewhere in the medical record under "Patient Instructions". Document created using STT-dictation technology, any transcriptional errors that may result from process are unintentional.    Contact & Pharmacy Preferred: [phone] Home: [phone] (home) Mobile: [phone] (mobile) E-mail: [id number] [address] [place] [address] Phone: [phone] Fax: [phone]  [id number] - [place], [address] [place] [address] Phone: [phone] Fax: [phone]   Pre-screening  [name] offered "in-person" vs "virtual" encounter. He indicated preferring virtual for this encounter.   Reason COVID-19*  Social distancing based on CDC and AMA recommendations.   I contacted [place] on 11/17/2020 via telephone.      I clearly identified myself as Gillis Santa, MD. I verified that I was speaking with the correct person using two identifiers (Name: [name], and date of birth: [birth year]).  Consent I sought verbal advanced consent from [name] for virtual visit interactions. I informed [name] of possible security and privacy concerns, risks, and limitations associated with  providing "not-in-person" medical evaluation and management services. I also informed [name] of the availability of "in-person" appointments. Finally, I informed him that there would be a charge for the virtual visit and that he could be  personally, fully or partially, financially responsible for it. [name] expressed understanding and agreed to proceed.   Historic Elements   [name] is a 50 y.o. year old, male patient evaluated today after our last contact on 09/09/2020. [name]  has a past medical history of Abdominal pain (2011), Collagen vascular disease ([place]), Diverticulitis, History of hiatal hernia, and Small bowel obstruction ([place]). He also  has a past surgical history that includes Colon resection; Cholecystectomy; arm surgery; loop ileostomy and repair of anastomotic leak at sigmoid colon (2011); ileostomy takedown (2011); Hemorrhoid surgery (N/A, 01/16/2019); Colonoscopy with propofol (N/A, 03/05/2019); Esophagogastroduodenoscopy (egd) with propofol (N/A, 03/05/2019); and Colon surgery (2011). [name] has a current medication list which includes the following prescription(s): cholecalciferol, [START ON 11/18/2020] hydrocodone-acetaminophen, fusion plus, omeprazole, vitamin b-12, and dicyclomine. He  reports that he quit smoking about 5 years ago. His smoking use included cigarettes. He smoked an average of 2 packs per day. He has never used smokeless tobacco. He reports that he does not drink alcohol and does not use drugs. [name] has No Known Allergies.   HPI  Today, he is being contacted for medication management.  No change in medical history since last visit.  Patient's pain is at baseline.  Patient continues multimodal pain regimen as prescribed.  States that it provides pain relief and improvement in functional status.  States that on certain days he has to take an extra half a tablet and is requesting a small increase in his quantity to reflect that.  2nd visit   note from 09/09/20 -Completed psych eval -UDS up to date -Patient informed of our pain clinic policy and given pain contract   Initial clinic note 06/24/20 [name] is a 50 year old male with a complex surgical history requiring multiple interventions to include cholecystectomy, SB resection, colectomy and  ileostomy and hernia repair.  He states that he has visits to the emergency department for severe abdominal pain that usually comes on spontaneously.  He states that this pain is very debilitating and he is unable to function.  Of note patient does have small left inguinal hernia however surgery is not required at this time.  His general surgeon is Dr. Dahlia Byes.  He has tried NSAIDs in the past including ibuprofen, naproxen along with acetaminophen.  He is currently on Bentyl at this time.  He is requesting a small prescription of hydrocodone to be utilized only for severe breakthrough pain.  He does not plan on using this as part of his daily pain regimen.   I informed the patient or protocol regarding chronic opioid therapy.  We will complete a baseline urine toxicology screen and I will send the patient for a psychological risk assessment for substance use disorder prior to initiating chronic opioid therapy specifically for breakthrough abdominal pain given multiple abdominal surgeries and associated leaky gut syndrome.  He is currently not on any opioid analgesics at this time.   Pharmacotherapy Assessment   Analgesic: Hydrocodone 5 mg prn #30 usually lasts 9-12 weeks   Monitoring: [place] PMP: PDMP reviewed during this encounter.       Pharmacotherapy: No side-effects or adverse reactions reported. Compliance: No problems identified. Effectiveness: Clinically acceptable. Plan: Refer to "POC". UDS:  Summary  Date Value Ref Range Status  06/24/2020 Note  Final    Comment:    ==================================================================== Compliance Drug Analysis,  Ur ==================================================================== Test                             Result       Flag       Units  Drug Absent but Declared for Prescription Verification   Hydrocodone                    Not Detected UNEXPECTED ng/mg creat   Acetaminophen                  Not Detected UNEXPECTED    Acetaminophen, as indicated in the declared medication list, is not    always detected even when used as directed.  ==================================================================== Test                      Result    Flag   Units      Ref Range   Creatinine              164              mg/dL      >=20 ==================================================================== Declared Medications:  The flagging and interpretation on this report are based on the  following declared medications.  Unexpected results may arise from  inaccuracies in the declared medications.   **Note: The testing scope of this panel includes these medications:   Hydrocodone (Norco)   **Note: The testing scope of this panel does not include small to  moderate amounts of these reported medications:   Acetaminophen (Norco)   **Note: The testing scope of this panel does  not include the  following reported medications:   Cyanocobalamin  Dicyclomine (Bentyl)  Omeprazole (Prilosec) ==================================================================== For clinical consultation, please call [phone]. ====================================================================      Laboratory Chemistry Profile   Renal Lab Results  Component Value Date   BUN 14 03/14/2020   CREATININE 1.18 03/14/2020   BCR 10 12/20/2019   GFRAA 79 12/20/2019   GFRNONAA >60 03/14/2020    Hepatic Lab Results  Component Value Date   AST 23 03/14/2020   ALT 17 03/14/2020   ALBUMIN 4.3 03/14/2020   ALKPHOS 61 03/14/2020   AMYLASE 49 09/02/2007   LIPASE 24 03/14/2020    Electrolytes Lab Results   Component Value Date   NA 136 03/14/2020   K 3.6 03/14/2020   CL 102 03/14/2020   CALCIUM 9.7 03/14/2020   MG 2.0 12/02/2014   PHOS 2.4 (L) 12/02/2014    Bone Lab Results  Component Value Date   VD25OH 24.9 (L) 12/20/2019    Inflammation (CRP: Acute Phase) (ESR: Chronic Phase) Lab Results  Component Value Date   CRP <1 02/28/2019   LATICACIDVEN 1.8 03/14/2020         Note: Above Lab results reviewed.  Imaging  CT ABDOMEN PELVIS W CONTRAST CLINICAL DATA:  Periumbilical pain since yesterday. History of multiple small bowel obstructions and surgeries.  EXAM: CT ABDOMEN AND PELVIS WITH CONTRAST  TECHNIQUE: Multidetector CT imaging of the abdomen and pelvis was performed using the standard protocol following bolus administration of intravenous contrast.  CONTRAST:  13mL OMNIPAQUE IOHEXOL 300 MG/ML  SOLN  COMPARISON:  02/12/2019 and plain films of earlier today.  FINDINGS: Lower chest: clear lung bases. Normal heart size without pericardial or pleural effusion.  Hepatobiliary: Moderate hepatic steatosis. Cholecystectomy, without biliary ductal dilatation.  Pancreas: Normal, without mass or ductal dilatation.  Spleen: Normal in size, without focal abnormality.  Adrenals/Urinary Tract: Normal adrenal glands. Normal kidneys, without hydronephrosis. Normal urinary bladder.  Stomach/Bowel: Gastric body underdistention.  Normal caliber of the colon, which is primarily fluid-filled. Normal terminal ileum and appendix.  Pelvic small bowel loops are thickened with mild mucosal hyperenhancement, including on 74/2. Surrounding mesenteric edema and interloop fluid.  The small bowel just distal to this is borderline to mildly dilated, including at 2.8 cm on 62/2.  This small bowel dilatation continues to the level of enterotomy sutures. Adjacent to the enterotomy sutures is an area of relative hyperattenuation including at 1.4 cm on 54/2. This area is not imaged on  kidney delays.  Vascular/Lymphatic: Aortic atherosclerosis. No abdominopelvic adenopathy.  Reproductive: Normal prostate.  Other: No pneumatosis or free intraperitoneal air.  Musculoskeletal: No acute osseous abnormality.  IMPRESSION: 1. Pelvic small bowel wall thickening and mucosal hyperenhancement with surrounding mesenteric edema, suspicious for enteritis. 2. Small bowel dilatation just distal to this area, continuing to the level of a small bowel surgical sutures. Cannot exclude concurrent low-grade partial small bowel obstruction, presumably secondary to adhesions. 3. Focus of hyperattenuation within the small bowel, immediately adjacent to the enterotomy site. Although this could simply represent enteric contents, a small-bowel lesion cannot be excluded. Consider follow-up with CT enterography at 2-3 weeks to confirm resolution of enteritis and exclude small-bowel lesion in this area. 4. Hepatic steatosis. 5.  Aortic Atherosclerosis (ICD10-I70.0).  Electronically Signed   By: Abigail Miyamoto M.D.   On: 03/14/2020 12:03 DG Abdomen 1 View CLINICAL DATA:  Abdominal pain.  History of small-bowel obstruction.  EXAM: ABDOMEN - 1 VIEW  COMPARISON:  02/12/2019 CT  FINDINGS: Two  supine views. Cholecystectomy clips. No gaseous distention of bowel loops. Clear lung bases. Mild right hemidiaphragm elevation. Presumed phleboliths in the pelvis.  IMPRESSION: No acute findings.  Electronically Signed   By: Abigail Miyamoto M.D.   On: 03/14/2020 09:34  Assessment  The primary encounter diagnosis was Chronic pain syndrome. Diagnoses of Generalized abdominal pain, History of abdominal surgery, and Pain management contract signed were also pertinent to this visit.  Plan of Care    [name] has a current medication list which includes the following long-term medication(s): omeprazole and dicyclomine.  Pharmacotherapy (Medications Ordered): Meds ordered this encounter   Medications   HYDROcodone-acetaminophen (NORCO/VICODIN) 5-325 MG tablet    Sig: Take 1-2 tablets by mouth daily as needed for severe pain.    Dispense:  45 tablet    Refill:  0    Chronic Pain: STOP Act (Not applicable) Fill 1 day early if closed on refill date. Avoid benzodiazepines within 8 hours of opioids     Follow-up plan:   Return in about 3 months (around 02/17/2021) for Medication Management, in person.    Recent Visits Date Type Provider Dept  09/09/20 Office Visit Gillis Santa, MD [facility]  Showing recent visits within past 90 days and meeting all other requirements Today's Visits Date Type Provider Dept  11/17/20 Telemedicine Gillis Santa, MD [facility]  Showing today's visits and meeting all other requirements Future Appointments No visits were found meeting these conditions. Showing future appointments within next 90 days and meeting all other requirements I discussed the assessment and treatment plan with the patient. The patient was provided an opportunity to ask questions and all were answered. The patient agreed with the plan and demonstrated an understanding of the instructions.  Patient advised to call back or seek an in-person evaluation if the symptoms or condition worsens.  Duration of encounter: 4minutes.  Note by: Gillis Santa, MD Date: 11/17/2020; Time: 2:55 PM

## 2020-11-20 ENCOUNTER — Encounter: Payer: BC Managed Care – PPO | Admitting: Student in an Organized Health Care Education/Training Program

## 2020-11-26 ENCOUNTER — Ambulatory Visit: Payer: BC Managed Care – PPO | Admitting: Gastroenterology

## 2020-12-01 ENCOUNTER — Ambulatory Visit (INDEPENDENT_AMBULATORY_CARE_PROVIDER_SITE_OTHER): Payer: BC Managed Care – PPO | Admitting: Gastroenterology

## 2020-12-01 ENCOUNTER — Encounter: Payer: Self-pay | Admitting: Gastroenterology

## 2020-12-01 ENCOUNTER — Other Ambulatory Visit: Payer: Self-pay

## 2020-12-01 VITALS — BP 135/83 | HR 64 | Temp 99.1°F | Ht 78.0 in | Wt 165.0 lb

## 2020-12-01 DIAGNOSIS — R140 Abdominal distension (gaseous): Secondary | ICD-10-CM

## 2020-12-01 DIAGNOSIS — K219 Gastro-esophageal reflux disease without esophagitis: Secondary | ICD-10-CM | POA: Diagnosis not present

## 2020-12-01 DIAGNOSIS — R634 Abnormal weight loss: Secondary | ICD-10-CM

## 2020-12-01 DIAGNOSIS — R790 Abnormal level of blood mineral: Secondary | ICD-10-CM | POA: Diagnosis not present

## 2020-12-01 DIAGNOSIS — R109 Unspecified abdominal pain: Secondary | ICD-10-CM

## 2020-12-01 MED ORDER — OMEPRAZOLE 40 MG PO CPDR: 40.0000 mg | DELAYED_RELEASE_CAPSULE | Freq: Every day | ORAL | 2 refills | Status: DC

## 2020-12-01 MED ORDER — DICYCLOMINE HCL 20 MG PO TABS: 20.0000 mg | ORAL_TABLET | Freq: Three times a day (TID) | ORAL | 3 refills | Status: DC

## 2020-12-01 NOTE — Progress Notes (Signed)
[name], MD [address]  [address]  [place], [place] [address]  Main: [phone]  Fax: [phone]     Gastroenterology Consultation  Referring Provider:     [name], MD Primary Care Physician:  [name], MD Primary Gastroenterologist:  Dr. [name] Reason for Consultation: Abdominal pain, h/o sbo, weight loss        HPI:   [name] is a 50 y.o. male referred by Dr. [name], MD  for consultation & management of chronic abdominal pain and recurrent partial SBO.  For last 4-5 years, patient has been experiencing recurrent episodes of severe abdominal pain in different locations, associated with abdominal bloating, nausea and vomiting followed by relief of abdominal pain.  He had multiple ER visits as well as several admissions secondary to partial small bowel obstructions ever since he had complicated surgery in 2011.  Most recently, admitted to [facility] on 01/15/2019 secondary to severe anorectal pain, found to have strangulated internal hemorrhoids, underwent hemorrhoidectomy of the left lateral hemorrhoids by Dr. Everlene Farrier.  Patient is referred to [facility] by Dr. Everlene Farrier for further management of abdominal pain and history of partial SBO and to evaluate for possible Crohn's.  Patient is discharged home on dicyclomine and he thinks that the only medication which has helped him so far.  Today, he does not have active abdominal symptoms since admission.  However, he continues to have mild to moderate flareups at least 2-3 times a month.  He has adjusted his diet to minimize his symptoms.  Cannot tolerate high roughage foods at all.  His weight fluctuates but overall stable within a certain range.  He does not have anemia.  He does report fatigue.  His bowel movements fluctuate from 2-3 times a day to 3 to 4 days without a BM associated with significant straining.  He denies rectal bleeding at this time.  He thinks he is recovering well from  hemorrhoidectomy.  He denies smoking or alcohol use.  He works for [employer], in [place]  Patient has been followed by a gastroenterologist, Dr. [name] in [place] until recently.  Patient tells me that at some point he was told he has Crohn's disease.  Patient had multiple GI surgeries as listed below Hemorrhoidectomy in 2001 Cholecystectomy in 12/2002 Sigmoid colectomy on 07/19/2009 complicated by anastomotic leak of the sigmoid colon with abscesses and fistula resulting in repair of the anastomotic leak, diverting loop ileostomy and small bowel resection in 09/08/2009 Takedown of loop ileostomy 11/20/2009 Hemorrhoidectomy 01/16/2019  Follow-up visit 04/09/2019 Since last visit, patient underwent EGD and colonoscopy and feels significantly better.  He continues to take Bentyl 20 mg as needed which provides significant relief of his abdominal pain and bloating.  He had severe vitamin D deficiency, currently taking 50,000 units weekly.  He reports that his energy levels have improved, gained few pounds. Patient finished 2 weeks course of rifaximin which he thinks has modestly helped  Follow-up visit 07/16/2019 Patient reports that omeprazole and Bentyl have been helping with his GI symptoms.  He also reports abdominal discomfort in the epigastric region associated with early satiety, fullness and bloating.  He had pork chops yesterday and reports that the symptoms are worse today.  He has history of mild erosive esophagitis and continues to take omeprazole 40 mg daily.  His weight has been stable.  He is taking B12 supplements daily  Follow-up visit 12/20/2019 Patient reports ongoing epigastric pain associated with nausea, severe bloating,  the symptoms are predominantly postprandial.  He reports burning pain in the retrosternal area as well.  Currently taking omeprazole 40 mg daily before breakfast.  He does consume a can of carbonated beverage and ice tea every day.  He reports his bowel  movements are somewhat hard and feels gassy all the time.  He has not tried any stool softener.  Follow-up visit 08/26/2020 Patient lost about 9 pounds since last visit.  He said he eliminated ice tea.  He is down to half a can of soda daily.  He reports feeling better overall.  He does have alternating episodes of diarrhea and 2 to 3 days without a BM.  Last night, he had 2-3 runs of diarrhea followed by abdominal cramps.  He is taking omeprazole 40 mg daily.  Follow-up visit 12/01/2020 Patient lost another 10 pounds since last visit.  He reports that he has been drinking 3 bottles of water at a time and feels food and therefore not able to eat.  He thinks his weight loss is secondary to drinking lots of water.  He denies any GI symptoms otherwise.  He continues to have 2-3 soft bowel movements daily.  His pancreatic fecal elastase levels were normal.  Calprotectin levels were also normal.  Patient thinks that iron infusion has caused hard of dental issues and therefore he decided to stop  NSAIDs: None  Antiplts/Anticoagulants/Anti thrombotics: None  GI Procedures: EGD and colonoscopy 03/05/2019  - Normal duodenal bulb and second portion of the duodenum. Biopsied. 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 - Small hiatal hernia. - Normal stomach. Biopsied. - Esophagogastric landmarks identified. - Widely patent Schatzki ring. - LA Grade A reflux esophagitis with no bleeding. - Normal esophagus. Biopsied.  - The examined portion of the ileum was normal. - Patent functional end-to-end colo-colonic anastomosis, characterized by healthy appearing mucosa. - Normal mucosa in the entire examined colon. - Non-bleeding internal hemorrhoids. - No specimens collected.  DIAGNOSIS:  A.  DUODENUM; COLD BIOPSY:  - UNREMARKABLE DUODENAL MUCOSA.  - NEGATIVE FOR FEATURES OF CELIAC DISEASE, DYSPLASIA, AND MALIGNANCY.   B.  STOMACH, RANDOM; COLD BIOPSY:  - ANTRAL MUCOSA WITH FOCAL REACTIVE  GASTRITIS.  - UNREMARKABLE OXYNTIC MUCOSA.  - NEGATIVE FOR H. PYLORI, DYSPLASIA, AND MALIGNANCY.   C.  ESOPHAGUS; COLD BIOPSY:  - UNREMARKABLE SQUAMOUS MUCOSA.  - NEGATIVE FOR INTRAEPITHELIAL EOSINOPHILS, DYSPLASIA, AND MALIGNANCY.  Colonoscopy 11/11/1999 I. SMALL BOWEL, ILEUM BIOPSY: UNREMARKABLE SMALL BOWEL MUCOSA.   NO VILLOUS ATROPHY, INFLAMMATION OR OTHER ABNORMALITIES PRESENT.    II. COLON, BIOPSY: UNREMARKABLE COLONIC MUCOSA. NO SIGNIFICANT   INFLAMMATION OR OTHER ABNORMALITIES IDENTIFIED.   Colonoscopy 11/29/2000 I. SMALL BOWEL, ILEUM BIOPSY: UNREMARKABLE SMALL BOWEL MUCOSA.   NO VILLOUS ATROPHY, INFLAMMATION OR OTHER ABNORMALITIES PRESENT.    II. COLON, BIOPSY: UNREMARKABLE COLONIC MUCOSA. NO SIGNIFICANT   INFLAMMATION OR OTHER ABNORMALITIES IDENTIFIED.   Colonoscopy 02/28/2004 COLON, BIOPSY: NO ACTIVE COLITIS OR DYSPLASIA IDENTIFIED. SEE   COMMENT (BIOPSIES, RECTOSIGMOID)    COMMENT   There is benign colorectal mucosa in which there is mild to   moderate chronic inflammation within the lamina propria. At the   deep edge of one of the fragments there is a superficial aspect   of a mucosal lymphoid aggregate. This could conceivable account   for the nodularity noted colonoscopically. There is a rare gland   that contains a neutrophil but otherwise no active inflammation   is seen and the findings are non-specific. (JAS:gt) 03/02/04  Past  Medical History:  Diagnosis Date   Abdominal pain 2011   Collagen vascular disease (HCC)    Diverticulitis    History of hiatal hernia    Small bowel obstruction [facility])     Past Surgical History:  Procedure Laterality Date   arm surgery     CHOLECYSTECTOMY     COLON RESECTION     COLON SURGERY  2011   small bowel resection   COLONOSCOPY WITH PROPOFOL N/A 03/05/2019   Procedure: COLONOSCOPY WITH PROPOFOL;  Surgeon: Toney Reil, MD;  Location: [facility] ENDOSCOPY;  Service: Gastroenterology;  Laterality: N/A;    ESOPHAGOGASTRODUODENOSCOPY (EGD) WITH PROPOFOL N/A 03/05/2019   Procedure: ESOPHAGOGASTRODUODENOSCOPY (EGD) WITH PROPOFOL;  Surgeon: Toney Reil, MD;  Location: [facility] ENDOSCOPY;  Service: Gastroenterology;  Laterality: N/A;   HEMORRHOID SURGERY N/A 01/16/2019   Procedure: HEMORRHOIDECTOMY;  Surgeon: Leafy Ro, MD;  Location: [facility] ORS;  Service: General;  Laterality: N/A;   ileostomy takedown  2011   loop ileostomy and repair of anastomotic leak at sigmoid colon  2011    Current Outpatient Medications:    HYDROcodone-acetaminophen (NORCO/VICODIN) 5-325 MG tablet, Take 1-2 tablets by mouth daily as needed for severe pain., Disp: 45 tablet, Rfl: 0   Iron-FA-B Cmp-C-Biot-Probiotic (FUSION PLUS) CAPS, Take 1 capsule by mouth once., Disp: , Rfl:    cholecalciferol (VITAMIN D3) 25 MCG (1000 UNIT) tablet, Take 1,000 Units by mouth daily. (Patient not taking: Reported on 12/01/2020), Disp: , Rfl:    dicyclomine (BENTYL) 20 MG tablet, Take 1 tablet (20 mg total) by mouth 3 (three) times daily before meals., Disp: 90 tablet, Rfl: 3   omeprazole (PRILOSEC) 40 MG capsule, Take 1 capsule (40 mg total) by mouth daily before breakfast., Disp: 90 capsule, Rfl: 2   vitamin B-12 (CYANOCOBALAMIN) 1000 MCG tablet, Take 1,000 mcg by mouth daily. (Patient not taking: Reported on 12/01/2020), Disp: , Rfl:    Family History  Problem Relation Age of Onset   Heart disease Mother    Heart disease Father    Mental illness Neg Hx      Social History   Tobacco Use   Smoking status: Former    Packs/day: 2.00    Types: Cigarettes    Quit date: 2017    Years since quitting: 5.6   Smokeless tobacco: Never  Vaping Use   Vaping Use: Former   Devices: rare use  Substance Use Topics   Alcohol use: No   Drug use: No    Allergies as of 12/01/2020   (No Known Allergies)    Review of Systems:    All systems reviewed and negative except where noted in HPI.   Physical Exam:  BP 135/83 (BP Location:  Right Arm, Patient Position: Sitting, Cuff Size: Normal)   Pulse 64   Temp 99.1 F (37.3 C) (Oral)   Ht 6\' 6"  (1.981 m)   Wt 165 lb (74.8 kg)   BMI 19.07 kg/m  No LMP for male patient.  General:   Alert,  Well-developed, well-nourished, pleasant and cooperative in NAD Head:  Normocephalic and atraumatic. Eyes:  Sclera clear, no icterus.   Conjunctiva pink. Ears:  Normal auditory acuity. Nose:  No deformity, discharge, or lesions. Mouth:  No deformity or lesions,oropharynx pink & moist. Neck:  Supple; no masses or thyromegaly. Lungs:  Respirations even and unlabored.  Clear throughout to auscultation.   No wheezes, crackles, or rhonchi. No acute distress. Heart:  Regular rate and rhythm; no murmurs, clicks, rubs, or gallops.  Abdomen:  Normal bowel sounds. Soft, non-tender and mildly distended, tympanic to percussion, several old healed scars from prior surgeries without masses, hepatosplenomegaly or hernias noted.  No guarding or rebound tenderness.   Rectal: Not performed Msk:  Symmetrical without gross deformities. Good, equal movement & strength bilaterally. Pulses:  Normal pulses noted. Extremities:  No clubbing or edema.  No cyanosis. Neurologic:  Alert and oriented x3;  grossly normal neurologically. Skin:  Intact without significant lesions or rashes. No jaundice. Psych:  Alert and cooperative. Normal mood and affect.  Imaging Studies: Reviewed  Assessment and Plan:   [place] P Clearance CootsHarper is a 50 y.o. male with history of sigmoid diverticulitis s/p sigmoid colectomy in 2011 complicated by anastomotic leak with abscesses and fistula, s/p diverting loop ileostomy, repair of fistula, and takedown in 11/2009 with chronic intermittent episodes of abdominal pain associated with abdominal bloating, nausea and vomiting consistent with recurrent partial small bowel obstructions.  Patient had several colonoscopies in the past with no evidence of inflammatory bowel disease including surgical  specimens.  His bowel obstructions are most likely secondary to adhesions.  EGD revealed pyloric stenosis which was dilated.  Duodenal, gastric and esophageal biopsies were unremarkable. Colonoscopy was normal Abdominal pain with bloating has fairly improved since last visit  Epigastric pain, bloating and early satiety and loose stools, weight loss Patient has history of pyloric stenosis, s/p EGD with balloon dilation to 20 mm in 02/2019.  Esophageal, gastric and duodenal biopsies were unremarkable Continue omeprazole to 40 mg daily Pancreatic fecal elastase levels were normal Encouraged him to increase his calorie intake by high protein diet Check TSH, serum cortisol levels  History of small bowel obstructions Fecal calprotectin levels are normal, colonoscopy with TI evaluation was normal in 02/2019.  No evidence of Crohn's disease or ulcerative colitis.  Patient would like to defer MR enterography at this time  History of B12 deficiency, resolved No evidence of anemia Did have history of iron deficiency, s/p iron therapy, recheck levels today  Follow up in 6 months   Arlyss Repressohini R Caiden Arteaga, MD

## 2020-12-02 ENCOUNTER — Encounter: Payer: Self-pay | Admitting: Gastroenterology

## 2020-12-02 LAB — CBC
Hematocrit: 43.3 % (ref 37.5–51.0)
Hemoglobin: 14.8 g/dL (ref 13.0–17.7)
MCH: 30.4 pg (ref 26.6–33.0)
MCHC: 34.2 g/dL (ref 31.5–35.7)
MCV: 89 fL (ref 79–97)
Platelets: 293 10*3/uL (ref 150–450)
RBC: 4.87 x10E6/uL (ref 4.14–5.80)
RDW: 14.3 % (ref 11.6–15.4)
WBC: 6.8 10*3/uL (ref 3.4–10.8)

## 2020-12-02 LAB — IRON,TIBC AND FERRITIN PANEL
Ferritin: 26 ng/mL — ABNORMAL LOW (ref 30–400)
Iron Saturation: 28 % (ref 15–55)
Iron: 90 ug/dL (ref 38–169)
Total Iron Binding Capacity: 320 ug/dL (ref 250–450)
UIBC: 230 ug/dL (ref 111–343)

## 2020-12-02 LAB — CORTISOL: Cortisol: 7.6 ug/dL

## 2020-12-02 LAB — TSH: TSH: 2.07 u[IU]/mL (ref 0.450–4.500)

## 2021-02-17 ENCOUNTER — Other Ambulatory Visit: Payer: Self-pay

## 2021-02-17 ENCOUNTER — Encounter: Payer: Self-pay | Admitting: Student in an Organized Health Care Education/Training Program

## 2021-02-17 ENCOUNTER — Ambulatory Visit
Payer: BC Managed Care – PPO | Attending: Student in an Organized Health Care Education/Training Program | Admitting: Student in an Organized Health Care Education/Training Program

## 2021-02-17 DIAGNOSIS — R1084 Generalized abdominal pain: Secondary | ICD-10-CM | POA: Diagnosis not present

## 2021-02-17 DIAGNOSIS — Z9889 Other specified postprocedural states: Secondary | ICD-10-CM | POA: Diagnosis not present

## 2021-02-17 DIAGNOSIS — Z0289 Encounter for other administrative examinations: Secondary | ICD-10-CM | POA: Diagnosis not present

## 2021-02-17 DIAGNOSIS — G894 Chronic pain syndrome: Secondary | ICD-10-CM

## 2021-02-17 MED ORDER — HYDROCODONE-ACETAMINOPHEN 5-325 MG PO TABS: 1.0000 | ORAL_TABLET | Freq: Every day | ORAL | 0 refills | Status: AC | PRN

## 2021-02-17 NOTE — Progress Notes (Signed)
Patient: [name]  Service Category: E/M  Provider: Gillis Santa, MD  DOB: 1971-03-21  DOS: 02/17/2021  Location: Office  MRN: [id number]  Setting: Ambulatory outpatient  Referring Provider: Lorelee Market, MD  Type: Established Patient  Specialty: Interventional Pain Management  PCP: Lorelee Market, MD  Location: Home  Delivery: TeleHealth     Virtual Encounter - Pain Management PROVIDER NOTE: Information contained herein reflects review and annotations entered in association with encounter. Interpretation of such information and data should be left to medically-trained personnel. Information provided to patient can be located elsewhere in the medical record under "Patient Instructions". Document created using STT-dictation technology, any transcriptional errors that may result from process are unintentional.    Contact & Pharmacy Preferred: [phone] Home: [phone] (home) Mobile: [phone] (mobile) E-mail: [id number] [address] [place] [address] Phone: [phone] Fax: [phone]  [id number] [place] [place] [address] Phone: [phone] Fax: [phone]   Pre-screening  Mr. Jodi Mourning offered "in-person" vs "virtual" encounter. He indicated preferring virtual for this encounter.   Reason COVID-19*  Social distancing based on CDC and AMA recommendations.   I contacted [place] on 02/17/2021 via telephone.      I clearly identified myself as Gillis Santa, MD. I verified that I was speaking with the correct person using two identifiers (Name: Hale CHING RABIDEAU, and date of birth: 06/24/1970).  Consent I sought verbal advanced consent from [place] for virtual visit interactions. I informed Mr. Pembroke of possible security and privacy concerns, risks, and limitations associated with  providing "not-in-person" medical evaluation and management services. I also informed Mr. Sattar of the availability of "in-person" appointments. Finally, I informed him that there would be a charge for the virtual visit and that he could be  personally, fully or partially, financially responsible for it. Mr. Knecht expressed understanding and agreed to proceed.   Historic Elements   Mr. Pryor Guettler Repka is a 50 y.o. year old, male patient evaluated today after our last contact on 11/17/20 Mr. Statzer  has a past medical history of Abdominal pain (2011), Collagen vascular disease ([place]), Diverticulitis, History of hiatal hernia, and Small bowel obstruction ([place]). He also  has a past surgical history that includes Colon resection; Cholecystectomy; arm surgery; loop ileostomy and repair of anastomotic leak at sigmoid colon (2011); ileostomy takedown (2011); Hemorrhoid surgery (N/A, 01/16/2019); Colonoscopy with propofol (N/A, 03/05/2019); Esophagogastroduodenoscopy (egd) with propofol (N/A, 03/05/2019); and Colon surgery (2011). Mr. Vecchio has a current medication list which includes the following prescription(s): hydrocodone-acetaminophen, cholecalciferol, dicyclomine, fusion plus, omeprazole, and vitamin b-12. He  reports that he quit smoking about 5 years ago. His smoking use included cigarettes. He smoked an average of 2 packs per day. He has never used smokeless tobacco. He reports that he does not drink alcohol and does not use drugs. Mr. Volkert has No Known Allergies.   HPI  Today, he is being contacted for medication management.  Medication refill of hydrocodone.  At his last visit, his quantity was increased from 30-->45.  He states that this is helpful.  He takes 0.5 to 1 tablet daily as needed.  His last prescription was written on 11/17/2020 for quantity of 45. No side effects with current medication.  Endorses analgesic and functional benefit.  2nd visit  note from 09/09/20 -Completed  psych eval -UDS  up to date -Patient informed of our pain clinic policy and given pain contract   Initial clinic note 06/24/20 Vernon is a 50 year old male with a complex surgical history requiring multiple interventions to include cholecystectomy, SB resection, colectomy and  ileostomy and hernia repair.  He states that he has visits to the emergency department for severe abdominal pain that usually comes on spontaneously.  He states that this pain is very debilitating and he is unable to function.  Of note patient does have small left inguinal hernia however surgery is not required at this time.  His general surgeon is Dr. Dahlia Byes.  He has tried NSAIDs in the past including ibuprofen, naproxen along with acetaminophen.  He is currently on Bentyl at this time.  He is requesting a small prescription of hydrocodone to be utilized only for severe breakthrough pain.  He does not plan on using this as part of his daily pain regimen.   I informed the patient or protocol regarding chronic opioid therapy.  We will complete a baseline urine toxicology screen and I will send the patient for a psychological risk assessment for substance use disorder prior to initiating chronic opioid therapy specifically for breakthrough abdominal pain given multiple abdominal surgeries and associated leaky gut syndrome.  He is currently not on any opioid analgesics at this time.   Pharmacotherapy Assessment   Analgesic: Hydrocodone 5 mg prn #45 usually lasts 11-12 weeks   Monitoring: [place] PMP: PDMP reviewed during this encounter.       Pharmacotherapy: No side-effects or adverse reactions reported. Compliance: No problems identified. Effectiveness: Clinically acceptable. Plan: Refer to "POC". UDS:  Summary  Date Value Ref Range Status  06/24/2020 Note  Final    Comment:    ==================================================================== Compliance Drug Analysis, Ur ==================================================================== Test                              Result       Flag       Units  Drug Absent but Declared for Prescription Verification   Hydrocodone                    Not Detected UNEXPECTED ng/mg creat   Acetaminophen                  Not Detected UNEXPECTED    Acetaminophen, as indicated in the declared medication list, is not    always detected even when used as directed.  ==================================================================== Test                      Result    Flag   Units      Ref Range   Creatinine              164              mg/dL      >=20 ==================================================================== Declared Medications:  The flagging and interpretation on this report are based on the  following declared medications.  Unexpected results may arise from  inaccuracies in the declared medications.   **Note: The testing scope of this panel includes these medications:   Hydrocodone (Norco)   **Note: The testing scope of this panel does not include small to  moderate amounts of these reported medications:   Acetaminophen (Norco)   **Note: The testing scope of this panel does not include the  following reported medications:  Cyanocobalamin  Dicyclomine (Bentyl)  Omeprazole (Prilosec) ==================================================================== For clinical consultation, please call [phone]. ====================================================================      Laboratory Chemistry Profile   Renal Lab Results  Component Value Date   BUN 14 03/14/2020   CREATININE 1.18 03/14/2020   BCR 10 12/20/2019   GFRAA 79 12/20/2019   GFRNONAA >60 03/14/2020    Hepatic Lab Results  Component Value Date   AST 23 03/14/2020   ALT 17 03/14/2020   ALBUMIN 4.3 03/14/2020   ALKPHOS 61 03/14/2020   AMYLASE 49 09/02/2007   LIPASE 24 03/14/2020    Electrolytes Lab Results  Component Value Date   NA 136 03/14/2020   K 3.6 03/14/2020   CL 102 03/14/2020    CALCIUM 9.7 03/14/2020   MG 2.0 12/02/2014   PHOS 2.4 (L) 12/02/2014    Bone Lab Results  Component Value Date   VD25OH 24.9 (L) 12/20/2019    Inflammation (CRP: Acute Phase) (ESR: Chronic Phase) Lab Results  Component Value Date   CRP <1 02/28/2019   LATICACIDVEN 1.8 03/14/2020         Note: Above Lab results reviewed.  Imaging  CT ABDOMEN PELVIS W CONTRAST CLINICAL DATA:  Periumbilical pain since yesterday. History of multiple small bowel obstructions and surgeries.  EXAM: CT ABDOMEN AND PELVIS WITH CONTRAST  TECHNIQUE: Multidetector CT imaging of the abdomen and pelvis was performed using the standard protocol following bolus administration of intravenous contrast.  CONTRAST:  146mL OMNIPAQUE IOHEXOL 300 MG/ML  SOLN  COMPARISON:  02/12/2019 and plain films of earlier today.  FINDINGS: Lower chest: clear lung bases. Normal heart size without pericardial or pleural effusion.  Hepatobiliary: Moderate hepatic steatosis. Cholecystectomy, without biliary ductal dilatation.  Pancreas: Normal, without mass or ductal dilatation.  Spleen: Normal in size, without focal abnormality.  Adrenals/Urinary Tract: Normal adrenal glands. Normal kidneys, without hydronephrosis. Normal urinary bladder.  Stomach/Bowel: Gastric body underdistention.  Normal caliber of the colon, which is primarily fluid-filled. Normal terminal ileum and appendix.  Pelvic small bowel loops are thickened with mild mucosal hyperenhancement, including on 74/2. Surrounding mesenteric edema and interloop fluid.  The small bowel just distal to this is borderline to mildly dilated, including at 2.8 cm on 62/2.  This small bowel dilatation continues to the level of enterotomy sutures. Adjacent to the enterotomy sutures is an area of relative hyperattenuation including at 1.4 cm on 54/2. This area is not imaged on kidney delays.  Vascular/Lymphatic: Aortic atherosclerosis. No  abdominopelvic adenopathy.  Reproductive: Normal prostate.  Other: No pneumatosis or free intraperitoneal air.  Musculoskeletal: No acute osseous abnormality.  IMPRESSION: 1. Pelvic small bowel wall thickening and mucosal hyperenhancement with surrounding mesenteric edema, suspicious for enteritis. 2. Small bowel dilatation just distal to this area, continuing to the level of a small bowel surgical sutures. Cannot exclude concurrent low-grade partial small bowel obstruction, presumably secondary to adhesions. 3. Focus of hyperattenuation within the small bowel, immediately adjacent to the enterotomy site. Although this could simply represent enteric contents, a small-bowel lesion cannot be excluded. Consider follow-up with CT enterography at 2-3 weeks to confirm resolution of enteritis and exclude small-bowel lesion in this area. 4. Hepatic steatosis. 5.  Aortic Atherosclerosis (ICD10-I70.0).  Electronically Signed   By: Abigail Miyamoto M.D.   On: 03/14/2020 12:03 DG Abdomen 1 View CLINICAL DATA:  Abdominal pain.  History of small-bowel obstruction.  EXAM: ABDOMEN - 1 VIEW  COMPARISON:  02/12/2019 CT  FINDINGS: Two supine views. Cholecystectomy clips. No gaseous distention of bowel  loops. Clear lung bases. Mild right hemidiaphragm elevation. Presumed phleboliths in the pelvis.  IMPRESSION: No acute findings.  Electronically Signed   By: Abigail Miyamoto M.D.   On: 03/14/2020 09:34  Assessment  The primary encounter diagnosis was Chronic pain syndrome. Diagnoses of Generalized abdominal pain, History of abdominal surgery, and Pain management contract signed were also pertinent to this visit.  Plan of Care    Mr. Zaiden Ludlum Noda has a current medication list which includes the following long-term medication(s): dicyclomine and omeprazole.  Pharmacotherapy (Medications Ordered): Meds ordered this encounter  Medications   HYDROcodone-acetaminophen (NORCO/VICODIN) 5-325 MG  tablet    Sig: Take 1-2 tablets by mouth daily as needed for severe pain.    Dispense:  45 tablet    Refill:  0    Chronic Pain: STOP Act (Not applicable) Fill 1 day early if closed on refill date. Avoid benzodiazepines within 8 hours of opioids   Each prescription to last 90 days.   Follow-up plan:   Return in about 3 months (around 05/19/2021) for Medication Management, in person.    Recent Visits No visits were found meeting these conditions. Showing recent visits within past 90 days and meeting all other requirements Today's Visits Date Type Provider Dept  02/17/21 Office Visit Gillis Santa, MD [facility]  Showing today's visits and meeting all other requirements Future Appointments No visits were found meeting these conditions. Showing future appointments within next 90 days and meeting all other requirements I discussed the assessment and treatment plan with the patient. The patient was provided an opportunity to ask questions and all were answered. The patient agreed with the plan and demonstrated an understanding of the instructions.  Patient advised to call back or seek an in-person evaluation if the symptoms or condition worsens.  Duration of encounter: 74minutes.  Note by: Gillis Santa, MD Date: 02/17/2021; Time: 11:29 AM

## 2021-04-06 ENCOUNTER — Telehealth: Payer: Self-pay

## 2021-04-06 NOTE — Telephone Encounter (Signed)
Patient called and wanted a appointment for medicine refill so made him an appointment

## 2021-04-13 ENCOUNTER — Ambulatory Visit (INDEPENDENT_AMBULATORY_CARE_PROVIDER_SITE_OTHER): Payer: No Typology Code available for payment source | Admitting: Gastroenterology

## 2021-04-13 ENCOUNTER — Encounter: Payer: Self-pay | Admitting: Gastroenterology

## 2021-04-13 DIAGNOSIS — R140 Abdominal distension (gaseous): Secondary | ICD-10-CM | POA: Diagnosis not present

## 2021-04-13 DIAGNOSIS — R109 Unspecified abdominal pain: Secondary | ICD-10-CM

## 2021-04-13 DIAGNOSIS — K219 Gastro-esophageal reflux disease without esophagitis: Secondary | ICD-10-CM | POA: Diagnosis not present

## 2021-04-13 MED ORDER — OMEPRAZOLE 40 MG PO CPDR: 40.0000 mg | DELAYED_RELEASE_CAPSULE | Freq: Every day | ORAL | 1 refills | Status: DC

## 2021-04-13 MED ORDER — DICYCLOMINE HCL 20 MG PO TABS: 20.0000 mg | ORAL_TABLET | Freq: Three times a day (TID) | ORAL | 1 refills | Status: DC

## 2021-04-13 NOTE — Progress Notes (Signed)
g

## 2021-04-13 NOTE — Progress Notes (Signed)
[name], MD [address]  [address]  [place], [place] [address]  Main: [phone]  Fax: [phone]     Gastroenterology Consultation  Referring Provider:     [name], MD Primary Care Physician:  [name], MD Primary Gastroenterologist:  Dr. [name] Reason for Consultation: Abdominal pain, h/o sbo, weight loss        HPI:   [name] is a 51 y.o. male referred by Dr. [name], MD  for consultation & management of chronic abdominal pain and recurrent partial SBO.  For last 4-5 years, patient has been experiencing recurrent episodes of severe abdominal pain in different locations, associated with abdominal bloating, nausea and vomiting followed by relief of abdominal pain.  He had multiple ER visits as well as several admissions secondary to partial small bowel obstructions ever since he had complicated surgery in 2011.  Most recently, admitted to [facility] on 01/15/2019 secondary to severe anorectal pain, found to have strangulated internal hemorrhoids, underwent hemorrhoidectomy of the left lateral hemorrhoids by Dr. Everlene Farrier.  Patient is referred to [facility] by Dr. Everlene Farrier for further management of abdominal pain and history of partial SBO and to evaluate for possible Crohn's.  Patient is discharged home on dicyclomine and he thinks that the only medication which has helped him so far.  Today, he does not have active abdominal symptoms since admission.  However, he continues to have mild to moderate flareups at least 2-3 times a month.  He has adjusted his diet to minimize his symptoms.  Cannot tolerate high roughage foods at all.  His weight fluctuates but overall stable within a certain range.  He does not have anemia.  He does report fatigue.  His bowel movements fluctuate from 2-3 times a day to 3 to 4 days without a BM associated with significant straining.  He denies rectal bleeding at this time.  He thinks he is recovering well from  hemorrhoidectomy.  He denies smoking or alcohol use.  He works for [employer], in [place]  Patient has been followed by a gastroenterologist, Dr. [name] in [place] until recently.  Patient tells me that at some point he was told he has Crohn's disease.  Patient had multiple GI surgeries as listed below Hemorrhoidectomy in 2001 Cholecystectomy in 12/2002 Sigmoid colectomy on 07/19/2009 complicated by anastomotic leak of the sigmoid colon with abscesses and fistula resulting in repair of the anastomotic leak, diverting loop ileostomy and small bowel resection in 09/08/2009 Takedown of loop ileostomy 11/20/2009 Hemorrhoidectomy 01/16/2019  Follow-up visit 04/09/2019 Since last visit, patient underwent EGD and colonoscopy and feels significantly better.  He continues to take Bentyl 20 mg as needed which provides significant relief of his abdominal pain and bloating.  He had severe vitamin D deficiency, currently taking 50,000 units weekly.  He reports that his energy levels have improved, gained few pounds. Patient finished 2 weeks course of rifaximin which he thinks has modestly helped  Follow-up visit 07/16/2019 Patient reports that omeprazole and Bentyl have been helping with his GI symptoms.  He also reports abdominal discomfort in the epigastric region associated with early satiety, fullness and bloating.  He had pork chops yesterday and reports that the symptoms are worse today.  He has history of mild erosive esophagitis and continues to take omeprazole 40 mg daily.  His weight has been stable.  He is taking B12 supplements daily  Follow-up visit 12/20/2019 Patient reports ongoing epigastric pain associated with nausea, severe bloating,  the symptoms are predominantly postprandial.  He reports burning pain in the retrosternal area as well.  Currently taking omeprazole 40 mg daily before breakfast.  He does consume a can of carbonated beverage and ice tea every day.  He reports his bowel  movements are somewhat hard and feels gassy all the time.  He has not tried any stool softener.  Follow-up visit 08/26/2020 Patient lost about 9 pounds since last visit.  He said he eliminated ice tea.  He is down to half a can of soda daily.  He reports feeling better overall.  He does have alternating episodes of diarrhea and 2 to 3 days without a BM.  Last night, he had 2-3 runs of diarrhea followed by abdominal cramps.  He is taking omeprazole 40 mg daily.  Follow-up visit 12/01/2020 Patient lost another 10 pounds since last visit.  He reports that he has been drinking 3 bottles of water at a time and feels food and therefore not able to eat.  He thinks his weight loss is secondary to drinking lots of water.  He denies any GI symptoms otherwise.  He continues to have 2-3 soft bowel movements daily.  His pancreatic fecal elastase levels were normal.  Calprotectin levels were also normal.  Patient thinks that iron infusion has caused hard of dental issues and therefore he decided to stop  Follow-up visit 04/13/2021 Patient is here for routine follow-up.  He is pleased that he has been gaining weight.  He does not have any active GI issues today other than chronic symptoms of early satiety, upper abdominal discomfort for which she takes Bentyl.  He is also on omeprazole 40 mg daily.  He does have known pyloric stenosis which was dilated in the past.  Patient has adapted small frequent meals which is helping to gain weight with increased p.o. intake.  Patient does not have any concerns today.  He had a big, heavy breakfast at [facility] the other day which made him feel miserable with epigastric discomfort for 2 hours and then finally subsided  NSAIDs: None  Antiplts/Anticoagulants/Anti thrombotics: None  GI Procedures: EGD and colonoscopy 03/05/2019  - Normal duodenal bulb and second portion of the duodenum. Biopsied. 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 - Small hiatal hernia. -  Normal stomach. Biopsied. - Esophagogastric landmarks identified. - Widely patent Schatzki ring. - LA Grade A reflux esophagitis with no bleeding. - Normal esophagus. Biopsied.  - The examined portion of the ileum was normal. - Patent functional end-to-end colo-colonic anastomosis, characterized by healthy appearing mucosa. - Normal mucosa in the entire examined colon. - Non-bleeding internal hemorrhoids. - No specimens collected.  DIAGNOSIS:  A.  DUODENUM; COLD BIOPSY:  - UNREMARKABLE DUODENAL MUCOSA.  - NEGATIVE FOR FEATURES OF CELIAC DISEASE, DYSPLASIA, AND MALIGNANCY.   B.  STOMACH, RANDOM; COLD BIOPSY:  - ANTRAL MUCOSA WITH FOCAL REACTIVE GASTRITIS.  - UNREMARKABLE OXYNTIC MUCOSA.  - NEGATIVE FOR H. PYLORI, DYSPLASIA, AND MALIGNANCY.   C.  ESOPHAGUS; COLD BIOPSY:  - UNREMARKABLE SQUAMOUS MUCOSA.  - NEGATIVE FOR INTRAEPITHELIAL EOSINOPHILS, DYSPLASIA, AND MALIGNANCY.  Colonoscopy 11/11/1999 I. SMALL BOWEL, ILEUM BIOPSY: UNREMARKABLE SMALL BOWEL MUCOSA.   NO VILLOUS ATROPHY, INFLAMMATION OR OTHER ABNORMALITIES PRESENT.    II. COLON, BIOPSY: UNREMARKABLE COLONIC MUCOSA. NO SIGNIFICANT   INFLAMMATION OR OTHER ABNORMALITIES IDENTIFIED.   Colonoscopy 11/29/2000 I. SMALL BOWEL, ILEUM BIOPSY: UNREMARKABLE SMALL BOWEL MUCOSA.   NO VILLOUS ATROPHY, INFLAMMATION OR OTHER ABNORMALITIES PRESENT.    II. COLON,  BIOPSY: UNREMARKABLE COLONIC MUCOSA. NO SIGNIFICANT   INFLAMMATION OR OTHER ABNORMALITIES IDENTIFIED.   Colonoscopy 02/28/2004 COLON, BIOPSY: NO ACTIVE COLITIS OR DYSPLASIA IDENTIFIED. SEE   COMMENT (BIOPSIES, RECTOSIGMOID)    COMMENT   There is benign colorectal mucosa in which there is mild to   moderate chronic inflammation within the lamina propria. At the   deep edge of one of the fragments there is a superficial aspect   of a mucosal lymphoid aggregate. This could conceivable account   for the nodularity noted colonoscopically. There is a rare gland   that contains a  neutrophil but otherwise no active inflammation   is seen and the findings are non-specific. (JAS:gt) 03/02/04  Past Medical History:  Diagnosis Date   Abdominal pain 2011   Collagen vascular disease ([place])    Diverticulitis    History of hiatal hernia    Small bowel obstruction [facility])     Past Surgical History:  Procedure Laterality Date   arm surgery     CHOLECYSTECTOMY     COLON RESECTION     COLON SURGERY  2011   small bowel resection   COLONOSCOPY WITH PROPOFOL N/A 03/05/2019   Procedure: COLONOSCOPY WITH PROPOFOL;  Surgeon: Lin Landsman, MD;  Location: [facility] ENDOSCOPY;  Service: Gastroenterology;  Laterality: N/A;   ESOPHAGOGASTRODUODENOSCOPY (EGD) WITH PROPOFOL N/A 03/05/2019   Procedure: ESOPHAGOGASTRODUODENOSCOPY (EGD) WITH PROPOFOL;  Surgeon: Lin Landsman, MD;  Location[facility] ENDOSCOPY;  Service: Gastroenterology;  Laterality: N/A;   HEMORRHOID SURGERY N/A 01/16/2019   Procedure: HEMORRHOIDECTOMY;  Surgeon: Jules Husbands, MD;  Location: [facility] ORS;  Service: General;  Laterality: N/A;   ileostomy takedown  2011   loop ileostomy and repair of anastomotic leak at sigmoid colon  2011    Current Outpatient Medications:    cholecalciferol (VITAMIN D3) 25 MCG (1000 UNIT) tablet, Take 1,000 Units by mouth daily., Disp: , Rfl:    vitamin B-12 (CYANOCOBALAMIN) 1000 MCG tablet, Take 1,000 mcg by mouth daily., Disp: , Rfl:    dicyclomine (BENTYL) 20 MG tablet, Take 1 tablet (20 mg total) by mouth 3 (three) times daily before meals., Disp: 90 tablet, Rfl: 1   omeprazole (PRILOSEC) 40 MG capsule, Take 1 capsule (40 mg total) by mouth daily before breakfast., Disp: 90 capsule, Rfl: 1   Family History  Problem Relation Age of Onset   Heart disease Mother    Heart disease Father    Mental illness Neg Hx      Social History   Tobacco Use   Smoking status: Former    Packs/day: 2.00    Types: Cigarettes    Quit date: 2017    Years since quitting: 6.0   Smokeless  tobacco: Never  Vaping Use   Vaping Use: Former   Devices: rare use  Substance Use Topics   Alcohol use: No   Drug use: No    Allergies as of 04/13/2021 - Review Complete 04/13/2021  Allergen Reaction Noted   Prednisone Other (See Comments) 04/13/2021    Review of Systems:    All systems reviewed and negative except where noted in HPI.   Physical Exam:  BP (!) 142/79 (BP Location: Left Arm, Patient Position: Sitting, Cuff Size: Normal)    Pulse 64    Temp 98.2 F (36.8 C) (Oral)    Ht 6\' 6"  (1.981 m)    Wt 177 lb 6 oz (80.5 kg)    BMI 20.50 kg/m  No LMP for male patient.  General:  Alert,  Well-developed, well-nourished, pleasant and cooperative in NAD Head:  Normocephalic and atraumatic. Eyes:  Sclera clear, no icterus.   Conjunctiva pink. Ears:  Normal auditory acuity. Nose:  No deformity, discharge, or lesions. Mouth:  No deformity or lesions,oropharynx pink & moist. Neck:  Supple; no masses or thyromegaly. Lungs:  Respirations even and unlabored.  Clear throughout to auscultation.   No wheezes, crackles, or rhonchi. No acute distress. Heart:  Regular rate and rhythm; no murmurs, clicks, rubs, or gallops. Abdomen:  Normal bowel sounds. Soft, non-tender and mildly distended, tympanic to percussion, several old healed scars from prior surgeries without masses, hepatosplenomegaly or hernias noted.  No guarding or rebound tenderness.   Rectal: Not performed Msk:  Symmetrical without gross deformities. Good, equal movement & strength bilaterally. Pulses:  Normal pulses noted. Extremities:  No clubbing or edema.  No cyanosis. Neurologic:  Alert and oriented x3;  grossly normal neurologically. Skin:  Intact without significant lesions or rashes. No jaundice. Psych:  Alert and cooperative. Normal mood and affect.  Imaging Studies: Reviewed  Assessment and Plan:   Mats P Duel is a 51 y.o. male with history of sigmoid diverticulitis s/p sigmoid colectomy in [id number] complicated  by anastomotic leak with abscesses and fistula, s/p diverting loop ileostomy, repair of fistula, and takedown in 11/2009 with chronic intermittent episodes of abdominal pain associated with abdominal bloating, nausea and vomiting consistent with recurrent partial small bowel obstructions.  Patient had several colonoscopies in the past with no evidence of inflammatory bowel disease including surgical specimens.  His bowel obstructions are most likely secondary to adhesions.  EGD revealed pyloric stenosis which was dilated.  Duodenal, gastric and esophageal biopsies were unremarkable. Colonoscopy was normal Abdominal pain with bloating has fairly improved and stable  Epigastric pain, bloating and early satiety  Patient has history of pyloric stenosis, s/p EGD with balloon dilation to 20 mm in 02/2019.  Esophageal, gastric and duodenal biopsies were unremarkable Patient states that he cannot afford another EGD at this time Continue omeprazole to 40 mg daily Pancreatic fecal elastase levels were normal Encouraged him to increase his calorie intake by high protein diet, small frequent meals TSH, serum cortisol levels are normal  Erosive esophagitis Continue omeprazole 40 mg daily, patient cannot afford repeat EGD at this time  History of small bowel obstructions Fecal calprotectin levels are normal, colonoscopy with TI evaluation was normal in 02/2019.  No evidence of Crohn's disease or ulcerative colitis.  Patient would like to defer MR enterography at this time  History of B12 deficiency, resolved No evidence of anemia Did have history of iron deficiency, s/p iron therapy Refill meds today  Follow up in 6 months   Cephas Darby, MD

## 2021-05-14 ENCOUNTER — Other Ambulatory Visit: Payer: Self-pay

## 2021-05-14 ENCOUNTER — Encounter: Payer: Self-pay | Admitting: Student in an Organized Health Care Education/Training Program

## 2021-05-14 ENCOUNTER — Ambulatory Visit
Payer: No Typology Code available for payment source | Attending: Student in an Organized Health Care Education/Training Program | Admitting: Student in an Organized Health Care Education/Training Program

## 2021-05-14 DIAGNOSIS — Z9889 Other specified postprocedural states: Secondary | ICD-10-CM

## 2021-05-14 DIAGNOSIS — G894 Chronic pain syndrome: Secondary | ICD-10-CM

## 2021-05-14 DIAGNOSIS — Z0289 Encounter for other administrative examinations: Secondary | ICD-10-CM

## 2021-05-14 DIAGNOSIS — R1084 Generalized abdominal pain: Secondary | ICD-10-CM

## 2021-05-14 MED ORDER — HYDROCODONE-ACETAMINOPHEN 5-325 MG PO TABS: 1.0000 | ORAL_TABLET | Freq: Every day | ORAL | 0 refills | Status: AC | PRN

## 2021-05-14 NOTE — Progress Notes (Signed)
Patient: [name]  Service Category: E/M  Provider: Gillis Santa, MD  DOB: 16-Sep-1970  DOS: 05/14/2021  Location: Office  MRN: [id number]  Setting: Ambulatory outpatient  Referring Provider: Lorelee Market, MD  Type: Established Patient  Specialty: Interventional Pain Management  PCP: George Market, MD  Location: Home  Delivery: TeleHealth     Virtual Encounter - Pain Management PROVIDER NOTE: Information contained herein reflects review and annotations entered in association with encounter. Interpretation of such information and data should be left to medically-trained personnel. Information provided to patient can be located elsewhere in the medical record under "Patient Instructions". Document created using STT-dictation technology, any transcriptional errors that may result from process are unintentional.    Contact & Pharmacy Preferred: [phone] Home: [phone] (home) Mobile: [phone] (mobile) E-mail: [email or web address] .[id number] [address], [address] [place] [address] Phone: [phone] Fax: [phone]  [id number] [place] [place]: [phone]: [phone]   Pre-screening  [name] "in-person" vs "virtual" encounter. He indicated preferring virtual for this encounter.   Reason COVID-19*   Social distancing based on CDC and AMA recommendations.   I contacted George Bridgeon 53/23/2025 via telephone.      I clearly identified myself as BGillis Santa MD. I verified that I was speaking with the correct person using two identifiers (Name: [name] and date of birth: [birth year].  Consent I sought verbal advanced consent from LGardnerfor virtual visit interactions. I informed Mr. HJuarbeof possible security and privacy concerns, risks, and limitations associated with  providing "not-in-person" medical evaluation and management services. I also informed Mr. HGuymonof the availability of "in-person" appointments. Finally, I informed him that there would be a charge for the virtual visit and that he could be  personally, fully or partially, financially responsible for it. Mr. HGoinsexpressed understanding and agreed to proceed.   Historic Elements   [name] a 51y.o. year old, male patient evaluated today after our last contact on 11/17/20 [name] has a past medical history of Abdominal pain (2011), Collagen vascular disease ([place], Diverticulitis, History of hiatal hernia, and Small bowel obstruction (HGeorge [name]. He also  has a past surgical history that includes Colon resection; Cholecystectomy; arm surgery; loop ileostomy and repair of anastomotic leak at sigmoid colon (2011); ileostomy takedown (2011); Hemorrhoid surgery (N/A, 01/16/2019); Colonoscopy with propofol (N/A, 03/05/2019); Esophagogastroduodenoscopy (egd) with propofol (N/A, 03/05/2019); and Colon surgery (2011). [name] a current medication list which includes the following prescription(s): hydrocodone-acetaminophen, cholecalciferol, dicyclomine, omeprazole, and vitamin b-12. He  reports that he quit smoking about 6 years ago. His smoking use included cigarettes. He smoked an average of 2 packs per day. He has never used smokeless tobacco. He reports that he does not drink alcohol and does not use drugs. [name] allergic to prednisone.   HPI  Today, he is being contacted for medication management.  Follows up for medication management.  Patient is requesting to increase his quantity from 45--->60 which last him 3 months.  He takes half tablet to 1 tablet daily as needed but he has been more active and has been having increased abdominal bloating and increased abdominal related pain for which she is taking more frequent hydrocodone.  I was very transparent to be  clear with [name]  indicating that we initially started off on the 30th and escalated to quantity 45 and now he is requesting to increase to a quantity of 60.  He does not take his medications daily and I do feel reassured by the fact that his PDMP is accurate.  I informed him that the escalating his dosing further based on this and that his current prescription will be hydrocodone 5 mg to take daily as needed for breakthrough pain with a quantity of 60 to last 3 months.  02/17/21 Medication refill of hydrocodone.  At his last visit, his quantity was increased from 30-->45.  He states that this is helpful.  He takes 0.5 to 1 tablet daily as needed.  His last prescription was written on 11/17/2020 for quantity of 45. No side effects with current medication.  Endorses analgesic and functional benefit.  2nd visit  note from 09/09/20 -Completed psych eval -UDS up to date -Patient informed of our pain clinic policy and given pain contract   Initial clinic note 06/24/20 [name] is a 51 year old male with a complex surgical history requiring multiple interventions to include cholecystectomy, SB resection, colectomy and  ileostomy and hernia repair.  He states that he has visits to the emergency department for severe abdominal pain that usually comes on spontaneously.  He states that this pain is very debilitating and he is unable to function.  Of note patient does have small left inguinal hernia however surgery is not required at this time.  His general surgeon is Dr. Dahlia Byes.  He has tried NSAIDs in the past including ibuprofen, naproxen along with acetaminophen.  He is currently on Bentyl at this time.  He is requesting a small prescription of hydrocodone to be utilized only for severe breakthrough pain.  He does not plan on using this as part of his daily pain regimen.   I informed the patient or protocol regarding chronic opioid therapy.  We will complete a baseline urine toxicology screen and I will send the patient for a  psychological risk assessment for substance use disorder prior to initiating chronic opioid therapy specifically for breakthrough abdominal pain given multiple abdominal surgeries and associated leaky gut syndrome.  He is currently not on any opioid analgesics at this time.   Pharmacotherapy Assessment   Analgesic: Hydrocodone 5 mg prn #60 usually lasts 11-12 weeks   Monitoring: [place] PMP: PDMP reviewed during this encounter.       Pharmacotherapy: No side-effects or adverse reactions reported. Compliance: No problems identified. Effectiveness: Clinically acceptable. Plan: Refer to "POC". UDS:  Summary  Date Value Ref Range Status  06/24/2020 Note  Final    Comment:    ==================================================================== Compliance Drug Analysis, Ur ==================================================================== Test                             Result       Flag       Units  Drug Absent but Declared for Prescription Verification   Hydrocodone                    Not Detected UNEXPECTED ng/mg creat   Acetaminophen                  Not Detected UNEXPECTED    Acetaminophen, as indicated in the declared medication list, is not    always detected even when used as directed.  ==================================================================== Test  Result    Flag   Units      Ref Range   Creatinine              164              mg/dL      >=20 ==================================================================== Declared Medications:  The flagging and interpretation on this report are based on the  following declared medications.  Unexpected results may arise from  inaccuracies in the declared medications.   **Note: The testing scope of this panel includes these medications:   Hydrocodone (Norco)   **Note: The testing scope of this panel does not include small to  moderate amounts of these reported medications:   Acetaminophen (Norco)    **Note: The testing scope of this panel does not include the  following reported medications:   Cyanocobalamin  Dicyclomine (Bentyl)  Omeprazole (Prilosec) ==================================================================== For clinical consultation, please call [phone]. ====================================================================      Laboratory Chemistry Profile   Renal Lab Results  Component Value Date   BUN 14 03/14/2020   CREATININE 1.18 03/14/2020   BCR 10 12/20/2019   GFRAA 79 12/20/2019   GFRNONAA >60 03/14/2020    Hepatic Lab Results  Component Value Date   AST 23 03/14/2020   ALT 17 03/14/2020   ALBUMIN 4.3 03/14/2020   ALKPHOS 61 03/14/2020   AMYLASE 49 09/02/2007   LIPASE 24 03/14/2020    Electrolytes Lab Results  Component Value Date   NA 136 03/14/2020   K 3.6 03/14/2020   CL 102 03/14/2020   CALCIUM 9.7 03/14/2020   MG 2.0 12/02/2014   PHOS 2.4 (L) 12/02/2014    Bone Lab Results  Component Value Date   VD25OH 24.9 (L) 12/20/2019    Inflammation (CRP: Acute Phase) (ESR: Chronic Phase) Lab Results  Component Value Date   CRP <1 02/28/2019   LATICACIDVEN 1.8 03/14/2020         Note: Above Lab results reviewed.  Imaging  CT ABDOMEN PELVIS W CONTRAST CLINICAL DATA:  Periumbilical pain since yesterday. History of multiple small bowel obstructions and surgeries.  EXAM: CT ABDOMEN AND PELVIS WITH CONTRAST  TECHNIQUE: Multidetector CT imaging of the abdomen and pelvis was performed using the standard protocol following bolus administration of intravenous contrast.  CONTRAST:  163m OMNIPAQUE IOHEXOL 300 MG/ML  SOLN  COMPARISON:  02/12/2019 and plain films of earlier today.  FINDINGS: Lower chest: clear lung bases. Normal heart size without pericardial or pleural effusion.  Hepatobiliary: Moderate hepatic steatosis. Cholecystectomy, without biliary ductal dilatation.  Pancreas: Normal, without mass or ductal  dilatation.  Spleen: Normal in size, without focal abnormality.  Adrenals/Urinary Tract: Normal adrenal glands. Normal kidneys, without hydronephrosis. Normal urinary bladder.  Stomach/Bowel: Gastric body underdistention.  Normal caliber of the colon, which is primarily fluid-filled. Normal terminal ileum and appendix.  Pelvic small bowel loops are thickened with mild mucosal hyperenhancement, including on 74/2. Surrounding mesenteric edema and interloop fluid.  The small bowel just distal to this is borderline to mildly dilated, including at 2.8 cm on 62/2.  This small bowel dilatation continues to the level of enterotomy sutures. Adjacent to the enterotomy sutures is an area of relative hyperattenuation including at 1.4 cm on 54/2. This area is not imaged on kidney delays.  Vascular/Lymphatic: Aortic atherosclerosis. No abdominopelvic adenopathy.  Reproductive: Normal prostate.  Other: No pneumatosis or free intraperitoneal air.  Musculoskeletal: No acute osseous abnormality.  IMPRESSION: 1. Pelvic small bowel wall thickening and mucosal hyperenhancement with surrounding  mesenteric edema, suspicious for enteritis. 2. Small bowel dilatation just distal to this area, continuing to the level of a small bowel surgical sutures. Cannot exclude concurrent low-grade partial small bowel obstruction, presumably secondary to adhesions. 3. Focus of hyperattenuation within the small bowel, immediately adjacent to the enterotomy site. Although this could simply represent enteric contents, a small-bowel lesion cannot be excluded. Consider follow-up with CT enterography at 2-3 weeks to confirm resolution of enteritis and exclude small-bowel lesion in this area. 4. Hepatic steatosis. 5.  Aortic Atherosclerosis (ICD10-I70.0).  Electronically Signed   By: Abigail Miyamoto M.D.   On: 03/14/2020 12:03 DG Abdomen 1 View CLINICAL DATA:  Abdominal pain.  History of small-bowel  obstruction.  EXAM: ABDOMEN - 1 VIEW  COMPARISON:  02/12/2019 CT  FINDINGS: Two supine views. Cholecystectomy clips. No gaseous distention of bowel loops. Clear lung bases. Mild right hemidiaphragm elevation. Presumed phleboliths in the pelvis.  IMPRESSION: No acute findings.  Electronically Signed   By: Abigail Miyamoto M.D.   On: 03/14/2020 09:34   Assessment  The primary encounter diagnosis was Chronic pain syndrome. Diagnoses of Generalized abdominal pain, History of abdominal surgery, and Pain management contract signed were also pertinent to this visit.  Plan of Care    Mr. Adekunle Rohrbach Jain has a current medication list which includes the following long-term medication(s): dicyclomine and omeprazole.  Pharmacotherapy (Medications Ordered): Meds ordered this encounter  Medications   HYDROcodone-acetaminophen (NORCO/VICODIN) 5-325 MG tablet    Sig: Take 1-2 tablets by mouth daily as needed for severe pain. Must last 30 days.    Dispense:  60 tablet    Refill:  0    Chronic Pain: STOP Act (Not applicable) Fill 1 day early if closed on refill date. Avoid benzodiazepines within 8 hours of opioids   Each prescription to last 90 days.   Follow-up plan:   Return in about 3 months (around 08/11/2021) for Medication Management, in person (schedule AM visit).    Recent Visits Date Type Provider Dept  02/17/21 Office Visit Gillis Santa, MD [facility]  Showing recent visits within past 90 days and meeting all other requirements Today's Visits Date Type Provider Dept  05/14/21 Office Visit Gillis Santa, MD [facility]  Showing today's visits and meeting all other requirements Future Appointments No visits were found meeting these conditions. Showing future appointments within next 90 days and meeting all other requirements  I discussed the assessment and treatment plan with the patient. The patient was provided an opportunity to ask questions and all were  answered. The patient agreed with the plan and demonstrated an understanding of the instructions.  Patient advised to call back or seek an in-person evaluation if the symptoms or condition worsens.  Duration of encounter: 50mnutes.  Note by: BGillis Santa MD Date: 05/14/2021; Time: 11:14 AM

## 2021-06-01 IMAGING — CT CT ABD-PELV W/ CM
2 of 5 series · 16 of 46 positions shown, 18 images · IV contrast (omnipaque)
Comparison: CT dated 01/12/2019

CLINICAL DATA: Hernia. Mid abdominal pain.

EXAM:
CT ABDOMEN AND PELVIS WITH CONTRAST
TECHNIQUE: Multidetector CT imaging of the abdomen and pelvis was performed
using the standard protocol following bolus administration of
intravenous contrast.
CONTRAST:  100mL OMNIPAQUE IOHEXOL 300 MG/ML  SOLN

[Series 2: abd pelvis 5.00 · axial · 0.80mm/px · z∈[-1537,-1157]mm · 13 of 90 slices shown, 15 images]
[im 7/90  soft-tissue]
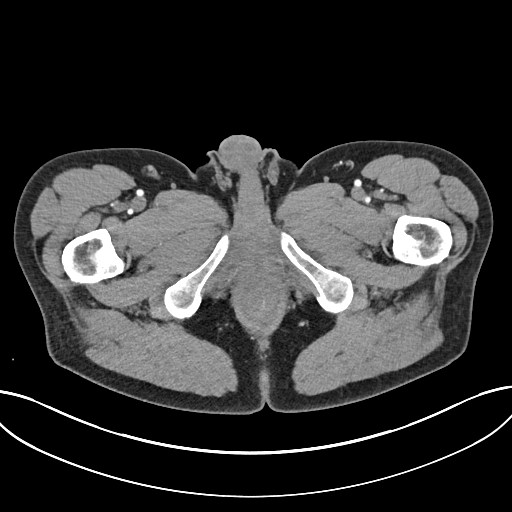
[im 7/90  bone]
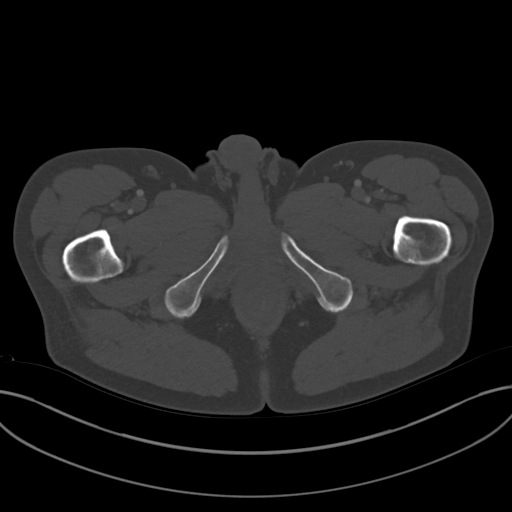
[im 13/90  soft-tissue]
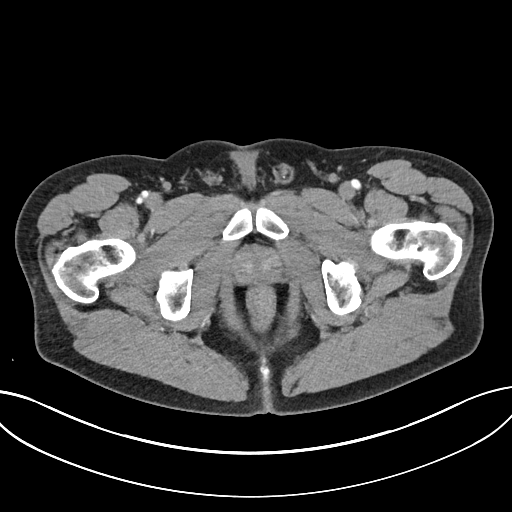
[im 20/90  soft-tissue]
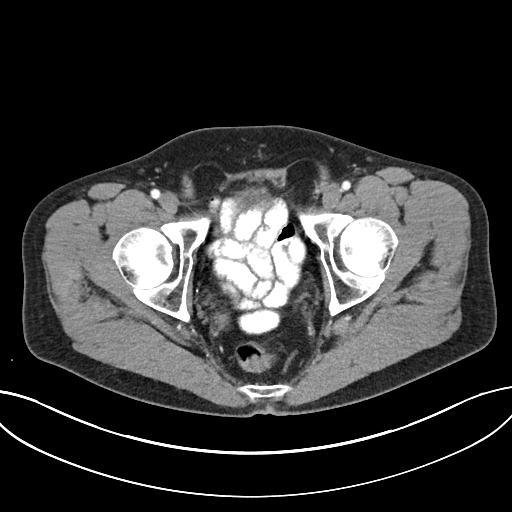
[im 26/90  soft-tissue]
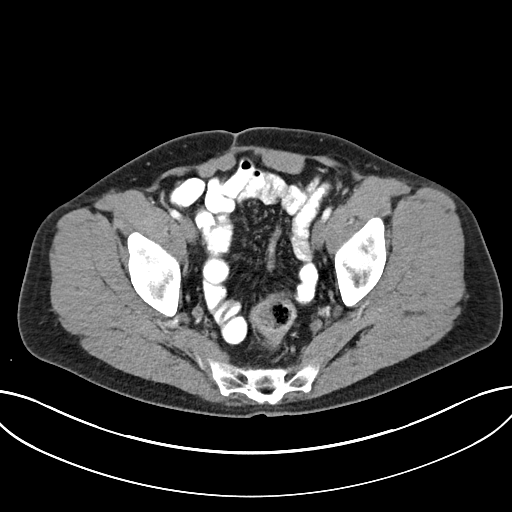
[im 32/90  soft-tissue]
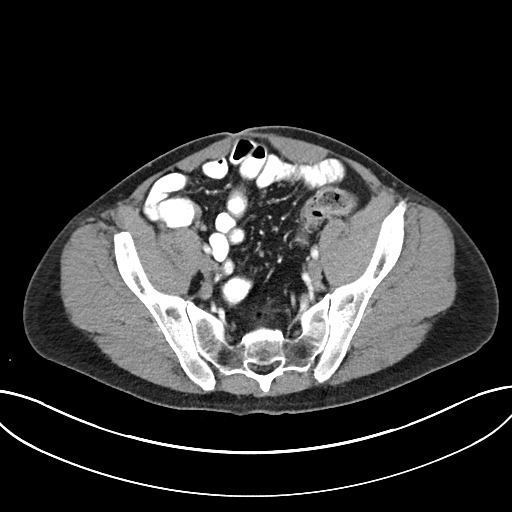
[im 39/90  soft-tissue]
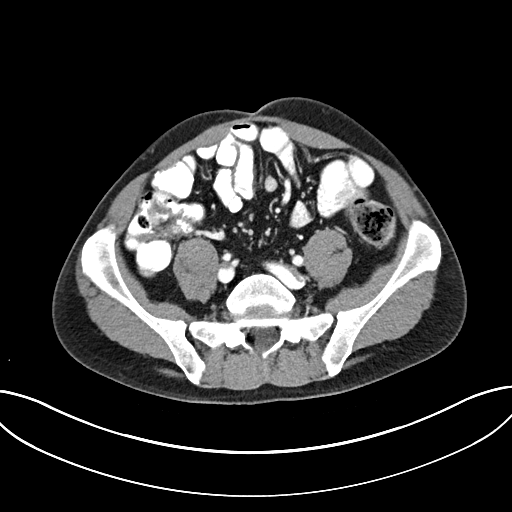
[im 45/90  soft-tissue]
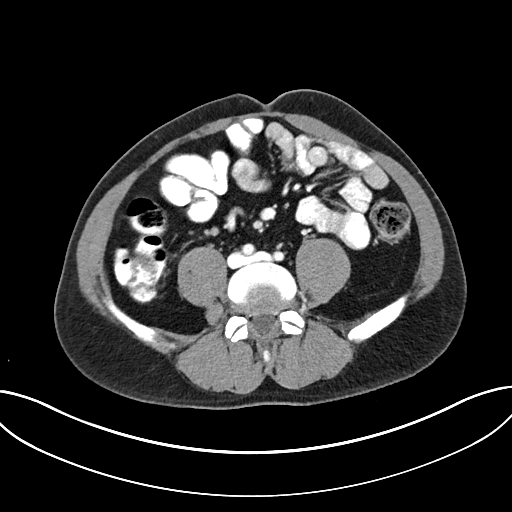
[im 51/90  soft-tissue]
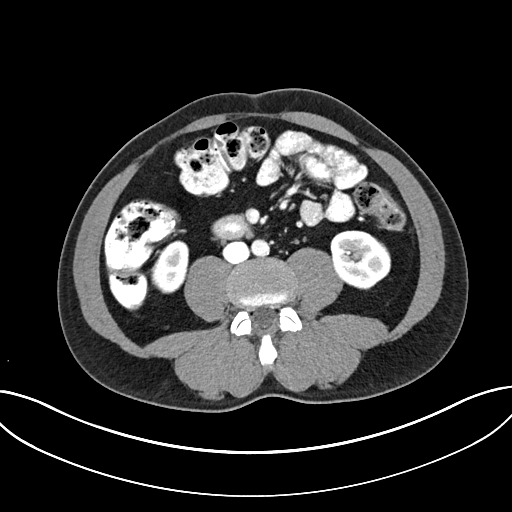
[im 58/90  soft-tissue]
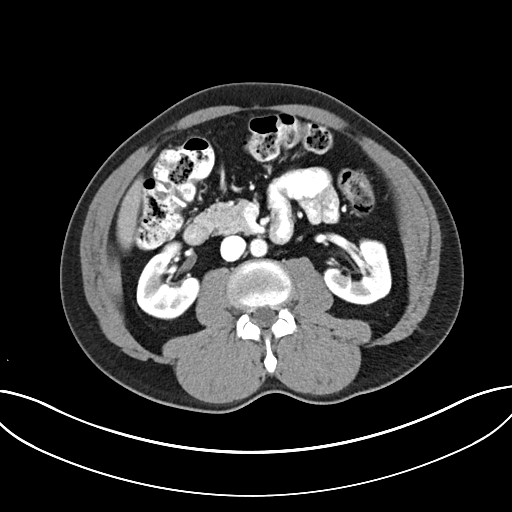
[im 58/90  bone]
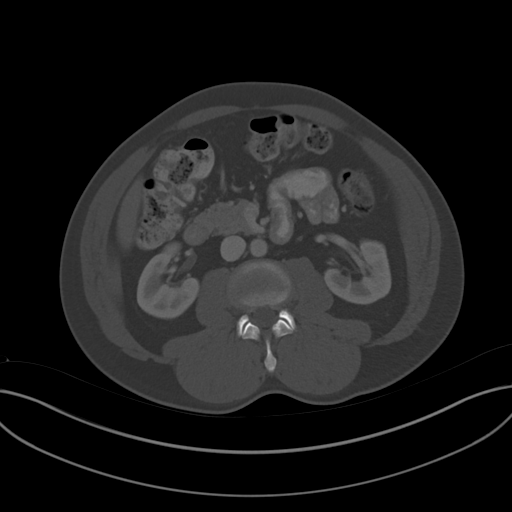
[im 64/90  soft-tissue]
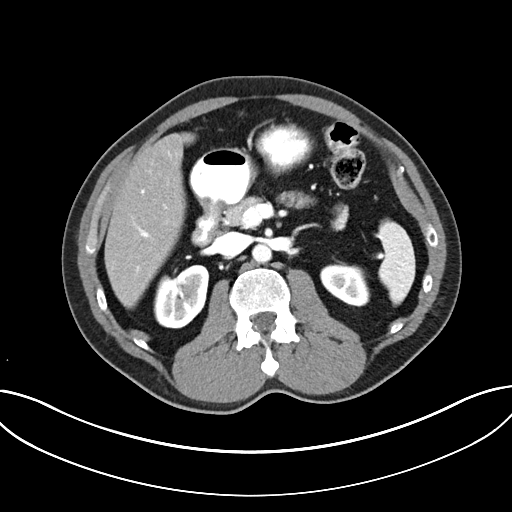
[im 70/90  soft-tissue]
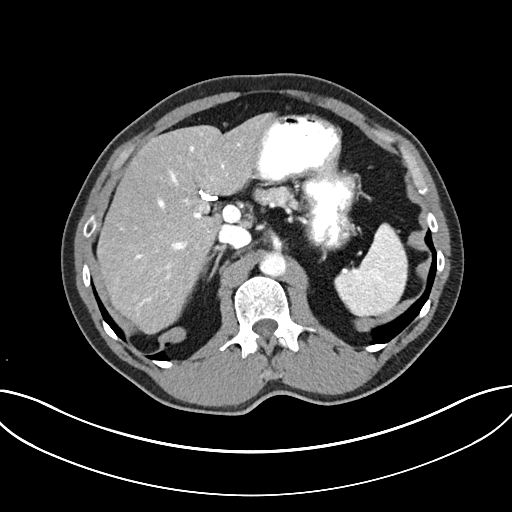
[im 77/90  soft-tissue]
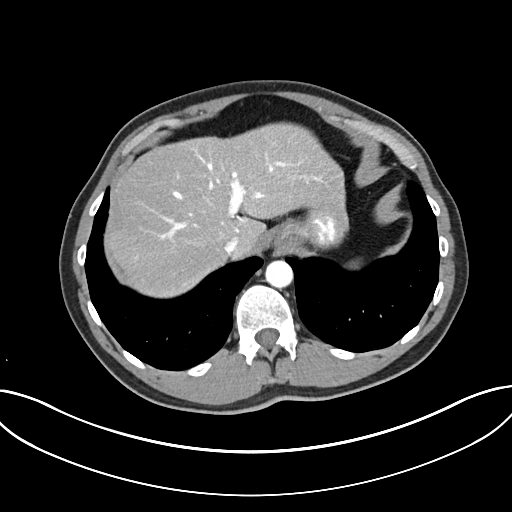
[im 83/90  soft-tissue]
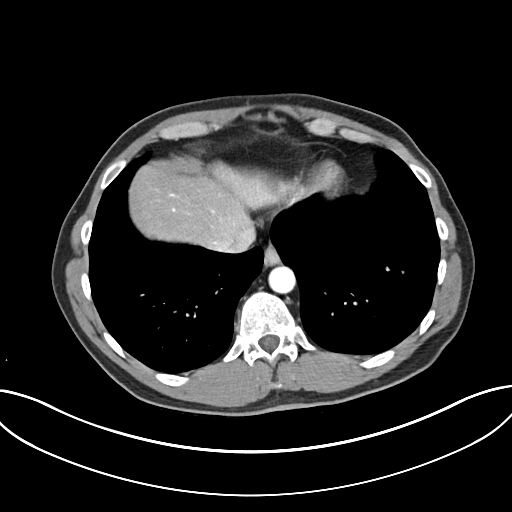

[Series 4: coronals abd pelvis 2.00 cor · coronal · 0.76mm/px · 3 of 139 slices shown]
[im 47/139  soft-tissue]
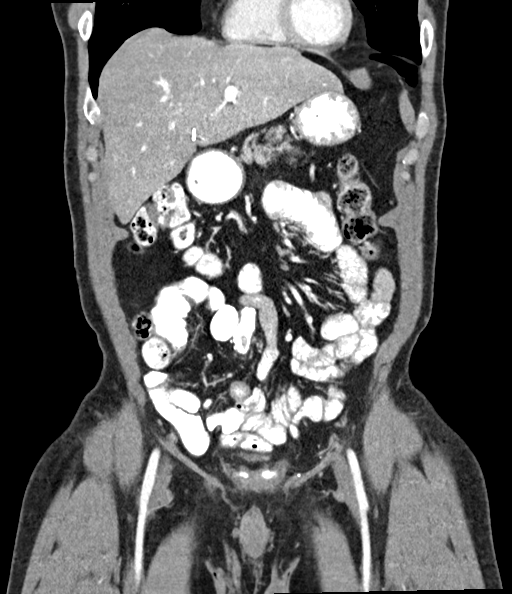
[im 62/139  soft-tissue]
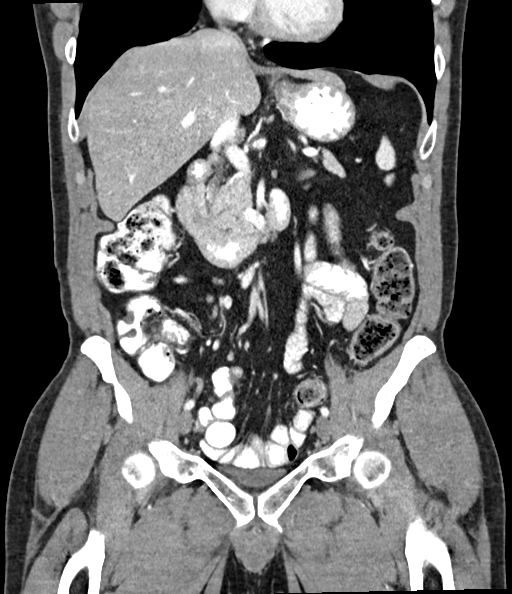
[im 77/139  soft-tissue]
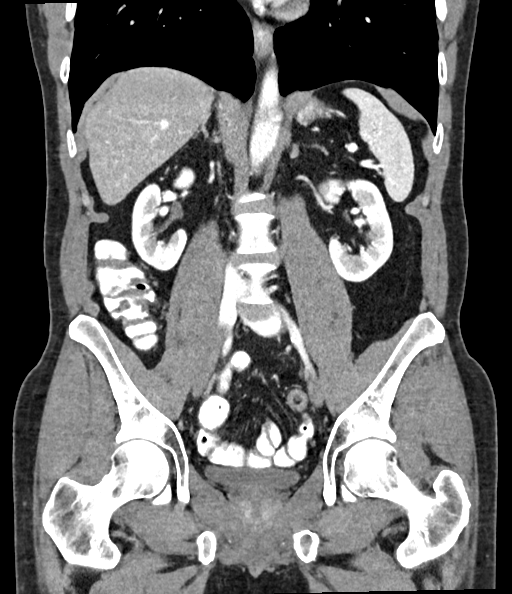

[16 of 46 positions shown; findings below may reference images not displayed]

FINDINGS: Lower chest: The lung bases are clear. The heart size is normal.

Hepatobiliary: There is decreased hepatic attenuation suggestive of
hepatic steatosis. Status post cholecystectomy.There is no biliary
ductal dilation.

Pancreas: Normal contours without ductal dilatation. No
peripancreatic fluid collection.

Spleen: No splenic laceration or hematoma.

Adrenals/Urinary Tract:

--Adrenal glands: No adrenal hemorrhage.

--Right kidney/ureter: No hydronephrosis or perinephric hematoma.

--Left kidney/ureter: No hydronephrosis or perinephric hematoma.

--Urinary bladder: Unremarkable.

Stomach/Bowel:

--Stomach/Duodenum: No hiatal hernia or other gastric abnormality.
Normal duodenal course and caliber.

--Small bowel: No dilatation or inflammation.

--Colon: No focal abnormality.

--Appendix: Not visualized. No right lower quadrant inflammation or
free fluid.

Vascular/Lymphatic: Atherosclerotic calcification is present within
the non-aneurysmal abdominal aorta, without hemodynamically
significant stenosis.

--No retroperitoneal lymphadenopathy.

--No mesenteric lymphadenopathy.

--No pelvic or inguinal lymphadenopathy.

Reproductive: Unremarkable

Other: No ascites or free air. There are bilateral fat containing
inguinal hernias, right greater than left.

Musculoskeletal. No acute displaced fractures.
IMPRESSION: 1. No acute abdominopelvic abnormality. Interval resolution of the
previously demonstrated findings concerning for they have ileitis.
2. Bilateral fat containing inguinal hernias, right greater than
left.
3. Hepatic steatosis.
4. Aortic atherosclerosis.

Aortic Atherosclerosis ([id number].J).

## 2021-08-06 ENCOUNTER — Encounter: Payer: Self-pay | Admitting: Student in an Organized Health Care Education/Training Program

## 2021-08-06 ENCOUNTER — Ambulatory Visit
Payer: No Typology Code available for payment source | Attending: Student in an Organized Health Care Education/Training Program | Admitting: Student in an Organized Health Care Education/Training Program

## 2021-08-06 VITALS — BP 134/83 | HR 66 | Temp 97.2°F | Resp 16 | Ht 72.0 in | Wt 176.0 lb

## 2021-08-06 DIAGNOSIS — Z0289 Encounter for other administrative examinations: Secondary | ICD-10-CM | POA: Diagnosis present

## 2021-08-06 DIAGNOSIS — G894 Chronic pain syndrome: Secondary | ICD-10-CM | POA: Diagnosis present

## 2021-08-06 DIAGNOSIS — Z9889 Other specified postprocedural states: Secondary | ICD-10-CM | POA: Diagnosis present

## 2021-08-06 DIAGNOSIS — K5730 Diverticulosis of large intestine without perforation or abscess without bleeding: Secondary | ICD-10-CM | POA: Diagnosis present

## 2021-08-06 DIAGNOSIS — R1084 Generalized abdominal pain: Secondary | ICD-10-CM | POA: Diagnosis present

## 2021-08-06 MED ORDER — HYDROCODONE-ACETAMINOPHEN 5-325 MG PO TABS: 1.0000 | ORAL_TABLET | Freq: Every day | ORAL | 0 refills | Status: AC | PRN

## 2021-08-06 MED ORDER — HYDROCODONE-ACETAMINOPHEN 5-325 MG PO TABS: 1.0000 | ORAL_TABLET | Freq: Every day | ORAL | 0 refills | Status: DC | PRN

## 2021-08-06 NOTE — Progress Notes (Signed)
Nursing Pain Medication Assessment:  Safety precautions to be maintained throughout the outpatient stay will include: orient to surroundings, keep bed in low position, maintain call bell within reach at all times, provide assistance with transfer out of bed and ambulation.  Medication Inspection Compliance: Pill count conducted under aseptic conditions, in front of the patient. Neither the pills nor the bottle was removed from the patient's sight at any time. Once count was completed pills were immediately returned to the patient in their original bottle.  Medication: See above Pill/Patch Count: 0/60 tabs  Pill/Patch Appearance: Markings consistent with prescribed medication Bottle Appearance: Standard pharmacy container. Clearly labeled. Filled Date: 2 / 23 / 2023 Last Medication intake:  Today

## 2021-08-06 NOTE — Progress Notes (Signed)
PROVIDER NOTE: Information contained herein reflects review and annotations entered in association with encounter. Interpretation of such information and data should be left to medically-trained personnel. Information provided to patient can be located elsewhere in the medical record under "Patient Instructions". Document created using STT-dictation technology, any transcriptional errors that may result from process are unintentional.    Patient: [name]  Service Category: E/M  Provider: Gillis Santa, MD  DOB: [birth year]  DOS: 08/06/2021  Specialty: Interventional Pain Management  MRN: [id number]  Setting: Ambulatory outpatient  PCP: Lorelee Market, MD  Type: Established Patient    Referring Provider: Lorelee Market, MD  Location: Office  Delivery: Face-to-face     HPI  [name], a 51 y.o. year old male, is here today because of his Pain management contract signed [[email or web address]]. [name] primary complain today is Abdominal Pain Last encounter: My last encounter with him was on 05/14/2021. Pertinent problems: [name] has History of abdominal surgery; Generalized abdominal pain; Chronic pain syndrome; Diverticular disease of colon; and Pain management contract signed on their pertinent problem list. Pain Assessment: Severity of Chronic pain is reported as a  /10. Location: Abdomen Lower/local lower abd. Onset: More than a month ago. Quality: Aching, Crushing, Crying, Discomfort, Nagging, Pins and needles, Pressure, Burning, Stabbing. Timing: Constant. Modifying factor(s): meds, rest. Vitals:  height is 6' (1.829 m) and weight is 176 lb (79.8 kg). His temperature is 97.2 F (36.2 C) (abnormal). His blood pressure is 134/83 and his pulse is 66. His respiration is 16 and oxygen saturation is 99%.   Reason for encounter: medication management.   Patient presents today for medication management.  No significant change in his medical history since her last clinic visit.  He is working  and finds that taking 1 hydrocodone a day helps his functional status and helps to manage his pain.  He is requesting a prescription to reflect 1 tablet/day as needed.  I informed him about chronic opioid therapy as a pertains to generalized abdominal pain.  We will make this 1 last change and keep his dose stable and constant.  Pharmacotherapy Assessment  Analgesic: Hydrocodone 5 mg daily prn    Monitoring: [place] PMP: PDMP reviewed during this encounter.       Pharmacotherapy: No side-effects or adverse reactions reported. Compliance: No problems identified. Effectiveness: Clinically acceptable.  George Specking, RN  08/06/2021 10:56 AM  Sign when Signing Visit Nursing Pain Medication Assessment:  Safety precautions to be maintained throughout the outpatient stay will include: orient to surroundings, keep bed in low position, maintain call bell within reach at all times, provide assistance with transfer out of bed and ambulation.  Medication Inspection Compliance: Pill count conducted under aseptic conditions, in front of the patient. Neither the pills nor the bottle was removed from the patient's sight at any time. Once count was completed pills were immediately returned to the patient in their original bottle.  Medication: See above Pill/Patch Count: 0/60 tabs  Pill/Patch Appearance: Markings consistent with prescribed medication Bottle Appearance: Standard pharmacy container. Clearly labeled. Filled Date: 2 / 23 / 2023 Last Medication intake:  Today    UDS:  Summary  Date Value Ref Range Status  06/24/2020 Note  Final    Comment:    ==================================================================== Compliance Drug Analysis, Ur ==================================================================== Test                             Result  Flag       Units  Drug Absent but Declared for Prescription Verification   Hydrocodone                    Not Detected UNEXPECTED ng/mg  creat   Acetaminophen                  Not Detected UNEXPECTED    Acetaminophen, as indicated in the declared medication list, is not    always detected even when used as directed.  ==================================================================== Test                      Result    Flag   Units      Ref Range   Creatinine              164              mg/dL      >=20 ==================================================================== Declared Medications:  The flagging and interpretation on this report are based on the  following declared medications.  Unexpected results may arise from  inaccuracies in the declared medications.   **Note: The testing scope of this panel includes these medications:   Hydrocodone (Norco)   **Note: The testing scope of this panel does not include small to  moderate amounts of these reported medications:   Acetaminophen (Norco)   **Note: The testing scope of this panel does not include the  following reported medications:   Cyanocobalamin  Dicyclomine (Bentyl)  Omeprazole (Prilosec) ==================================================================== For clinical consultation, please call [phone]. ====================================================================      ROS  Constitutional: Denies any fever or chills Gastrointestinal:  Chronic abdominal pain Musculoskeletal:  Abdominal pain Neurological: No reported episodes of acute onset apraxia, aphasia, dysarthria, agnosia, amnesia, paralysis, loss of coordination, or loss of consciousness  Medication Review  HYDROcodone-acetaminophen, [facility] Adult, dicyclomine, and omeprazole  History Review  Allergy: [name] is allergic to prednisone. Drug: [name]  reports no history of drug use. Alcohol:  reports no history of alcohol use. Tobacco:  reports that he quit smoking about 6 years ago. His smoking use included cigarettes. He smoked an average of 2 packs per day. He has  never used smokeless tobacco. Social: [name]  reports that he quit smoking about 6 years ago. His smoking use included cigarettes. He smoked an average of 2 packs per day. He has never used smokeless tobacco. He reports that he does not drink alcohol and does not use drugs. Medical:  has a past medical history of Abdominal pain (2011), Collagen vascular disease ([place]), Diverticulitis, History of hiatal hernia, and Small bowel obstruction ([place]). Surgical: Mr. Guedes  has a past surgical history that includes Colon resection; Cholecystectomy; arm surgery; loop ileostomy and repair of anastomotic leak at sigmoid colon (2011); ileostomy takedown (2011); Hemorrhoid surgery (N/A, 01/16/2019); Colonoscopy with propofol (N/A, 03/05/2019); Esophagogastroduodenoscopy (egd) with propofol (N/A, 03/05/2019); and Colon surgery (2011). Family: family history includes Heart disease in his father and mother.  Laboratory Chemistry Profile   Renal Lab Results  Component Value Date   BUN 14 03/14/2020   CREATININE 1.18 03/14/2020   BCR 10 12/20/2019   GFRAA 79 12/20/2019   GFRNONAA >60 03/14/2020    Hepatic Lab Results  Component Value Date   AST 23 03/14/2020   ALT 17 03/14/2020   ALBUMIN 4.3 03/14/2020   ALKPHOS 61 03/14/2020   AMYLASE 49 09/02/2007   LIPASE 24 03/14/2020    Electrolytes Lab  Results  Component Value Date   NA 136 03/14/2020   K 3.6 03/14/2020   CL 102 03/14/2020   CALCIUM 9.7 03/14/2020   MG 2.0 12/02/2014   PHOS 2.4 (L) 12/02/2014    Bone Lab Results  Component Value Date   VD25OH 24.9 (L) 12/20/2019    Inflammation (CRP: Acute Phase) (ESR: Chronic Phase) Lab Results  Component Value Date   CRP <1 02/28/2019   LATICACIDVEN 1.8 03/14/2020         Note: Above Lab results reviewed.  Recent Imaging Review  CT ABDOMEN PELVIS W CONTRAST CLINICAL DATA:  Periumbilical pain since yesterday. History of multiple small bowel obstructions and surgeries.  EXAM: CT ABDOMEN  AND PELVIS WITH CONTRAST  TECHNIQUE: Multidetector CT imaging of the abdomen and pelvis was performed using the standard protocol following bolus administration of intravenous contrast.  CONTRAST:  120m OMNIPAQUE IOHEXOL 300 MG/ML  SOLN  COMPARISON:  02/12/2019 and plain films of earlier today.  FINDINGS: Lower chest: clear lung bases. Normal heart size without pericardial or pleural effusion.  Hepatobiliary: Moderate hepatic steatosis. Cholecystectomy, without biliary ductal dilatation.  Pancreas: Normal, without mass or ductal dilatation.  Spleen: Normal in size, without focal abnormality.  Adrenals/Urinary Tract: Normal adrenal glands. Normal kidneys, without hydronephrosis. Normal urinary bladder.  Stomach/Bowel: Gastric body underdistention.  Normal caliber of the colon, which is primarily fluid-filled. Normal terminal ileum and appendix.  Pelvic small bowel loops are thickened with mild mucosal hyperenhancement, including on 74/2. Surrounding mesenteric edema and interloop fluid.  The small bowel just distal to this is borderline to mildly dilated, including at 2.8 cm on 62/2.  This small bowel dilatation continues to the level of enterotomy sutures. Adjacent to the enterotomy sutures is an area of relative hyperattenuation including at 1.4 cm on 54/2. This area is not imaged on kidney delays.  Vascular/Lymphatic: Aortic atherosclerosis. No abdominopelvic adenopathy.  Reproductive: Normal prostate.  Other: No pneumatosis or free intraperitoneal air.  Musculoskeletal: No acute osseous abnormality.  IMPRESSION: 1. Pelvic small bowel wall thickening and mucosal hyperenhancement with surrounding mesenteric edema, suspicious for enteritis. 2. Small bowel dilatation just distal to this area, continuing to the level of a small bowel surgical sutures. Cannot exclude concurrent low-grade partial small bowel obstruction, presumably secondary to adhesions. 3.  Focus of hyperattenuation within the small bowel, immediately adjacent to the enterotomy site. Although this could simply represent enteric contents, a small-bowel lesion cannot be excluded. Consider follow-up with CT enterography at 2-3 weeks to confirm resolution of enteritis and exclude small-bowel lesion in this area. 4. Hepatic steatosis. 5.  Aortic Atherosclerosis (ICD10-I70.0).  Electronically Signed   By: KAbigail MiyamotoM.D.   On: 03/14/2020 12:03 DG Abdomen 1 View CLINICAL DATA:  Abdominal pain.  History of small-bowel obstruction.  EXAM: ABDOMEN - 1 VIEW  COMPARISON:  02/12/2019 CT  FINDINGS: Two supine views. Cholecystectomy clips. No gaseous distention of bowel loops. Clear lung bases. Mild right hemidiaphragm elevation. Presumed phleboliths in the pelvis.  IMPRESSION: No acute findings.  Electronically Signed   By: KAbigail MiyamotoM.D.   On: 03/14/2020 09:34 Note: Reviewed        Physical Exam  General appearance: Well nourished, well developed, and well hydrated. In no apparent acute distress Mental status: Alert, oriented x 3 (person, place, & time)       Respiratory: No evidence of acute respiratory distress Eyes: PERLA Vitals: BP 134/83   Pulse 66   Temp (!) 97.2 F (36.2 C)  Resp 16   Ht 6' (1.829 m)   Wt 176 lb (79.8 kg)   SpO2 99%   BMI 23.87 kg/m  BMI: Estimated body mass index is 23.87 kg/m as calculated from the following:   Height as of this encounter: 6' (1.829 m).   Weight as of this encounter: 176 lb (79.8 kg). Ideal: Ideal body weight: 77.6 kg (171 lb 1.2 oz) Adjusted ideal body weight: 78.5 kg (173 lb 0.7 oz)  Assessment   Diagnosis Status  1. Pain management contract signed   2. Diverticular disease of colon   3. Chronic pain syndrome   4. Generalized abdominal pain   5. History of abdominal surgery    Controlled Controlled Controlled   Updated Problems: Problem  Pain Management Contract Signed  Diverticular Disease of  Colon  History of Abdominal Surgery  Generalized Abdominal Pain  Chronic Pain Syndrome    Plan of Care   Mr. Casen Pryor Pember has a current medication list which includes the following long-term medication(s): dicyclomine and omeprazole.  Pharmacotherapy (Medications Ordered): Meds ordered this encounter  Medications   HYDROcodone-acetaminophen (NORCO/VICODIN) 5-325 MG tablet    Sig: Take 1 tablet by mouth daily as needed for moderate pain.    Dispense:  30 tablet    Refill:  0   HYDROcodone-acetaminophen (NORCO/VICODIN) 5-325 MG tablet    Sig: Take 1 tablet by mouth daily as needed for moderate pain.    Dispense:  30 tablet    Refill:  0   HYDROcodone-acetaminophen (NORCO/VICODIN) 5-325 MG tablet    Sig: Take 1 tablet by mouth daily as needed for moderate pain.    Dispense:  30 tablet    Refill:  0   Orders:  Orders Placed This Encounter  Procedures   ToxASSURE Select 13 (MW), Urine    Volume: 30 ml(s). Minimum 3 ml of urine is needed. Document temperature of fresh sample. Indications: Long term (current) use of opiate analgesic [phone])    Order Specific Question:   Release to patient    Answer:   Immediate   Follow-up plan:   Return in about 3 months (around 11/06/2021) for Medication Management, in person.    Recent Visits Date Type Provider Dept  05/14/21 Office Visit Gillis Santa, MD [facility]  Showing recent visits within past 90 days and meeting all other requirements Today's Visits Date Type Provider Dept  08/06/21 Office Visit Gillis Santa, MD [facility]  Showing today's visits and meeting all other requirements Future Appointments Date Type Provider Dept  11/03/21 Appointment Gillis Santa, MD [facility]  Showing future appointments within next 90 days and meeting all other requirements  I discussed the assessment and treatment plan with the patient. The patient was provided an opportunity to ask questions and all were  answered. The patient agreed with the plan and demonstrated an understanding of the instructions.  Patient advised to call back or seek an in-person evaluation if the symptoms or condition worsens.  Duration of encounter: 44mnutes.  Note by: BGillis Santa MD Date: 08/06/2021; Time: 11:34 AM

## 2021-08-11 LAB — TOXASSURE SELECT 13 (MW), URINE

## 2021-10-12 ENCOUNTER — Ambulatory Visit (INDEPENDENT_AMBULATORY_CARE_PROVIDER_SITE_OTHER): Payer: No Typology Code available for payment source | Admitting: Gastroenterology

## 2021-10-12 ENCOUNTER — Encounter: Payer: Self-pay | Admitting: Gastroenterology

## 2021-10-12 VITALS — BP 144/84 | HR 62 | Temp 98.7°F | Ht 72.0 in | Wt 166.5 lb

## 2021-10-12 DIAGNOSIS — K219 Gastro-esophageal reflux disease without esophagitis: Secondary | ICD-10-CM | POA: Diagnosis not present

## 2021-10-12 DIAGNOSIS — R140 Abdominal distension (gaseous): Secondary | ICD-10-CM

## 2021-10-12 DIAGNOSIS — R1013 Epigastric pain: Secondary | ICD-10-CM | POA: Diagnosis not present

## 2021-10-12 DIAGNOSIS — R109 Unspecified abdominal pain: Secondary | ICD-10-CM | POA: Diagnosis not present

## 2021-10-12 MED ORDER — OMEPRAZOLE 40 MG PO CPDR: 40.0000 mg | DELAYED_RELEASE_CAPSULE | Freq: Every day | ORAL | 3 refills | Status: DC

## 2021-10-12 MED ORDER — DICYCLOMINE HCL 20 MG PO TABS: 20.0000 mg | ORAL_TABLET | Freq: Three times a day (TID) | ORAL | 3 refills | Status: DC

## 2021-10-12 NOTE — Progress Notes (Signed)
[name], MD [address]  [address]  [place], [place] [address]  Main: [phone]  Fax: [phone]     Gastroenterology Consultation  Referring Provider:     [name], MD Primary Care Physician:  [name], MD Primary Gastroenterologist:  Dr. [name] Reason for Consultation: Upper abdominal bloating, h/o sbo, intermittent weight loss        HPI:   [name] is a 51 y.o. male referred by Dr. [name], MD  for consultation & management of chronic abdominal pain and recurrent partial SBO.  For last 4-5 years, patient has been experiencing recurrent episodes of severe abdominal pain in different locations, associated with abdominal bloating, nausea and vomiting followed by relief of abdominal pain.  He had multiple ER visits as well as several admissions secondary to partial small bowel obstructions ever since he had complicated surgery in 2011.  Most recently, admitted to [facility] on 01/15/2019 secondary to severe anorectal pain, found to have strangulated internal hemorrhoids, underwent hemorrhoidectomy of the left lateral hemorrhoids by Dr. Everlene Farrier.  Patient is referred to [facility] by Dr. Everlene Farrier for further management of abdominal pain and history of partial SBO and to evaluate for possible Crohn's.  Patient is discharged home on dicyclomine and he thinks that the only medication which has helped him so far.  Today, he does not have active abdominal symptoms since admission.  However, he continues to have mild to moderate flareups at least 2-3 times a month.  He has adjusted his diet to minimize his symptoms.  Cannot tolerate high roughage foods at all.  His weight fluctuates but overall stable within a certain range.  He does not have anemia.  He does report fatigue.  His bowel movements fluctuate from 2-3 times a day to 3 to 4 days without a BM associated with significant straining.  He denies rectal bleeding at this time.  He thinks he is  recovering well from hemorrhoidectomy.  He denies smoking or alcohol use.  He works for [employer], in [place]  Patient has been followed by a gastroenterologist, Dr. [name] in [place] until recently.  Patient tells me that at some point he was told he has Crohn's disease.  Patient had multiple GI surgeries as listed below Hemorrhoidectomy in 2001 Cholecystectomy in 12/2002 Sigmoid colectomy on 07/19/2009 complicated by anastomotic leak of the sigmoid colon with abscesses and fistula resulting in repair of the anastomotic leak, diverting loop ileostomy and small bowel resection in 09/08/2009 Takedown of loop ileostomy 11/20/2009 Hemorrhoidectomy 01/16/2019  Follow-up visit 04/09/2019 Since last visit, patient underwent EGD and colonoscopy and feels significantly better.  He continues to take Bentyl 20 mg as needed which provides significant relief of his abdominal pain and bloating.  He had severe vitamin D deficiency, currently taking 50,000 units weekly.  He reports that his energy levels have improved, gained few pounds. Patient finished 2 weeks course of rifaximin which he thinks has modestly helped  Follow-up visit 07/16/2019 Patient reports that omeprazole and Bentyl have been helping with his GI symptoms.  He also reports abdominal discomfort in the epigastric region associated with early satiety, fullness and bloating.  He had pork chops yesterday and reports that the symptoms are worse today.  He has history of mild erosive esophagitis and continues to take omeprazole 40 mg daily.  His weight has been stable.  He is taking B12 supplements daily  Follow-up visit 12/20/2019 Patient reports ongoing epigastric pain associated with nausea,  severe bloating, the symptoms are predominantly postprandial.  He reports burning pain in the retrosternal area as well.  Currently taking omeprazole 40 mg daily before breakfast.  He does consume a can of carbonated beverage and ice tea every day.   He reports his bowel movements are somewhat hard and feels gassy all the time.  He has not tried any stool softener.  Follow-up visit 08/26/2020 Patient lost about 9 pounds since last visit.  He said he eliminated ice tea.  He is down to half a can of soda daily.  He reports feeling better overall.  He does have alternating episodes of diarrhea and 2 to 3 days without a BM.  Last night, he had 2-3 runs of diarrhea followed by abdominal cramps.  He is taking omeprazole 40 mg daily.  Follow-up visit 12/01/2020 Patient lost another 10 pounds since last visit.  He reports that he has been drinking 3 bottles of water at a time and feels food and therefore not able to eat.  He thinks his weight loss is secondary to drinking lots of water.  He denies any GI symptoms otherwise.  He continues to have 2-3 soft bowel movements daily.  His pancreatic fecal elastase levels were normal.  Calprotectin levels were also normal.  Patient thinks that iron infusion has caused hard of dental issues and therefore he decided to stop  Follow-up visit 04/13/2021 Patient is here for routine follow-up.  He is pleased that he has been gaining weight.  He does not have any active GI issues today other than chronic symptoms of early satiety, upper abdominal discomfort for which she takes Bentyl.  He is also on omeprazole 40 mg daily.  He does have known pyloric stenosis which was dilated in the past.  Patient has adapted small frequent meals which is helping to gain weight with increased p.o. intake.  Patient does not have any concerns today.  He had a big, heavy breakfast at [facility] the other day which made him feel miserable with epigastric discomfort for 2 hours and then finally subsided  Follow-up visit 10/12/2021 Patient is here for follow-up of his upper GI symptoms of dyspepsia.  Patient reports intermittent episodes of abdominal bloating and cramps particularly after eating vegetables despite thorough cooking.  He has been  taking omeprazole 40 mg every other day and Bentyl as needed which have significantly improved his symptoms.  However, patient has lost about 8 to 10 pounds since last visit.  He states that during summer months he generally loses few pounds because he has been very active outdoors and sweating and he is single, manages all his household chores.  Today, patient is mostly concerned about postprandial upper abdominal bloating and cramping after eating vegetables.  He is still debating whether or not to undergo upper endoscopy although he knows he was benefited for few months since dilation of pyloric stenosis due to financial reasons.  NSAIDs: None  Antiplts/Anticoagulants/Anti thrombotics: None  GI Procedures: EGD and colonoscopy 03/05/2019  - Normal duodenal bulb and second portion of the duodenum. Biopsied. 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 - Small hiatal hernia. - Normal stomach. Biopsied. - Esophagogastric landmarks identified. - Widely patent Schatzki ring. - LA Grade A reflux esophagitis with no bleeding. - Normal esophagus. Biopsied.  - The examined portion of the ileum was normal. - Patent functional end-to-end colo-colonic anastomosis, characterized by healthy appearing mucosa. - Normal mucosa in the entire examined colon. - Non-bleeding internal hemorrhoids. - No  specimens collected.  DIAGNOSIS:  A.  DUODENUM; COLD BIOPSY:  - UNREMARKABLE DUODENAL MUCOSA.  - NEGATIVE FOR FEATURES OF CELIAC DISEASE, DYSPLASIA, AND MALIGNANCY.   B.  STOMACH, RANDOM; COLD BIOPSY:  - ANTRAL MUCOSA WITH FOCAL REACTIVE GASTRITIS.  - UNREMARKABLE OXYNTIC MUCOSA.  - NEGATIVE FOR H. PYLORI, DYSPLASIA, AND MALIGNANCY.   C.  ESOPHAGUS; COLD BIOPSY:  - UNREMARKABLE SQUAMOUS MUCOSA.  - NEGATIVE FOR INTRAEPITHELIAL EOSINOPHILS, DYSPLASIA, AND MALIGNANCY.  Colonoscopy 11/11/1999 I. SMALL BOWEL, ILEUM BIOPSY: UNREMARKABLE SMALL BOWEL MUCOSA.   NO VILLOUS ATROPHY, INFLAMMATION OR  OTHER ABNORMALITIES PRESENT.    II. COLON, BIOPSY: UNREMARKABLE COLONIC MUCOSA. NO SIGNIFICANT   INFLAMMATION OR OTHER ABNORMALITIES IDENTIFIED.   Colonoscopy 11/29/2000 I. SMALL BOWEL, ILEUM BIOPSY: UNREMARKABLE SMALL BOWEL MUCOSA.   NO VILLOUS ATROPHY, INFLAMMATION OR OTHER ABNORMALITIES PRESENT.    II. COLON, BIOPSY: UNREMARKABLE COLONIC MUCOSA. NO SIGNIFICANT   INFLAMMATION OR OTHER ABNORMALITIES IDENTIFIED.   Colonoscopy 02/28/2004 COLON, BIOPSY: NO ACTIVE COLITIS OR DYSPLASIA IDENTIFIED. SEE   COMMENT (BIOPSIES, RECTOSIGMOID)    COMMENT   There is benign colorectal mucosa in which there is mild to   moderate chronic inflammation within the lamina propria. At the   deep edge of one of the fragments there is a superficial aspect   of a mucosal lymphoid aggregate. This could conceivable account   for the nodularity noted colonoscopically. There is a rare gland   that contains a neutrophil but otherwise no active inflammation   is seen and the findings are non-specific. (JAS:gt) 03/02/04  Past Medical History:  Diagnosis Date   Abdominal pain 2011   Collagen vascular disease ([place])    Diverticulitis    History of hiatal hernia    Small bowel obstruction [facility])     Past Surgical History:  Procedure Laterality Date   arm surgery     CHOLECYSTECTOMY     COLON RESECTION     COLON SURGERY  2011   small bowel resection   COLONOSCOPY WITH PROPOFOL N/A 03/05/2019   Procedure: COLONOSCOPY WITH PROPOFOL;  Surgeon: Lin Landsman, MD;  Location: [facility] ENDOSCOPY;  Service: Gastroenterology;  Laterality: N/A;   ESOPHAGOGASTRODUODENOSCOPY (EGD) WITH PROPOFOL N/A 03/05/2019   Procedure: ESOPHAGOGASTRODUODENOSCOPY (EGD) WITH PROPOFOL;  Surgeon: Lin Landsman, MD;  Location: [facility] ENDOSCOPY;  Service: Gastroenterology;  Laterality: N/A;   HEMORRHOID SURGERY N/A 01/16/2019   Procedure: HEMORRHOIDECTOMY;  Surgeon: Jules Husbands, MD;  Location: [facility] ORS;  Service: General;   Laterality: N/A;   ileostomy takedown  2011   loop ileostomy and repair of anastomotic leak at sigmoid colon  2011    Current Outpatient Medications:    HYDROcodone-acetaminophen (NORCO/VICODIN) 5-325 MG tablet, Take 1 tablet by mouth daily as needed for moderate pain., Disp: 30 tablet, Rfl: 0   Multiple Vitamins-Minerals (SENTRY ADULT) TABS, Take by mouth., Disp: , Rfl:    dicyclomine (BENTYL) 20 MG tablet, Take 1 tablet (20 mg total) by mouth 3 (three) times daily before meals., Disp: 90 tablet, Rfl: 3   omeprazole (PRILOSEC) 40 MG capsule, Take 1 capsule (40 mg total) by mouth daily before breakfast., Disp: 90 capsule, Rfl: 3   Family History  Problem Relation Age of Onset   Heart disease Mother    Heart disease Father    Mental illness Neg Hx      Social History   Tobacco Use   Smoking status: Former    Packs/day: 2.00    Types: Cigarettes    Quit date: 2017  Years since quitting: 6.5   Smokeless tobacco: Never  Vaping Use   Vaping Use: Former   Devices: rare use  Substance Use Topics   Alcohol use: No   Drug use: No    Allergies as of 10/12/2021 - Review Complete 10/12/2021  Allergen Reaction Noted   Prednisone Other (See Comments) 04/13/2021    Review of Systems:    All systems reviewed and negative except where noted in HPI.   Physical Exam:  BP (!) 144/84 (BP Location: Left Arm, Patient Position: Sitting, Cuff Size: Normal)   Pulse 62   Temp 98.7 F (37.1 C) (Oral)   Ht 6' (1.829 m)   Wt 166 lb 8 oz (75.5 kg)   BMI 22.58 kg/m  No LMP for male patient.  General:   Alert,  Well-developed, well-nourished, pleasant and cooperative in NAD Head:  Normocephalic and atraumatic. Eyes:  Sclera clear, no icterus.   Conjunctiva pink. Ears:  Normal auditory acuity. Nose:  No deformity, discharge, or lesions. Mouth:  No deformity or lesions,oropharynx pink & moist. Neck:  Supple; no masses or thyromegaly. Lungs:  Respirations even and unlabored.  Clear  throughout to auscultation.   No wheezes, crackles, or rhonchi. No acute distress. Heart:  Regular rate and rhythm; no murmurs, clicks, rubs, or gallops. Abdomen:  Normal bowel sounds. Soft, non-tender and mildly distended, tympanic to percussion, several old healed scars from prior surgeries without masses, hepatosplenomegaly or hernias noted.  No guarding or rebound tenderness.   Rectal: Not performed Msk:  Symmetrical without gross deformities. Good, equal movement & strength bilaterally. Pulses:  Normal pulses noted. Extremities:  No clubbing or edema.  No cyanosis. Neurologic:  Alert and oriented x3;  grossly normal neurologically. Skin:  Intact without significant lesions or rashes. No jaundice. Psych:  Alert and cooperative. Normal mood and affect.  Imaging Studies: Reviewed  Assessment and Plan:   [name] is a 51 y.o. male with history of sigmoid diverticulitis s/p sigmoid colectomy in 2011 complicated by anastomotic leak with abscesses and fistula, s/p diverting loop ileostomy, repair of fistula, and takedown in 11/2009 with chronic intermittent episodes of abdominal pain associated with abdominal bloating, nausea and vomiting consistent with recurrent partial small bowel obstructions.  Patient had several colonoscopies in the past with no evidence of inflammatory bowel disease including surgical specimens.  His bowel obstructions are most likely secondary to adhesions.  EGD revealed pyloric stenosis which was dilated.  Duodenal, gastric and esophageal biopsies were unremarkable. Colonoscopy was normal Patient continues to experience intermittent abdominal bloating associated with cramping and discomfort triggered after eating high-fiber foods.   Pancreatic fecal elastase levels, fecal calprotectin were normal.  Epigastric pain, bloating and early satiety  Patient has history of pyloric stenosis, s/p EGD with balloon dilation to 20 mm in 02/2019.  Esophageal, gastric and duodenal  biopsies were unremarkable Today, I have discussed with him regarding EGD again and he would like to get it scheduled in in December or January.  I discussed with him regarding dilation of pyloric stenosis with intralesional steroid injection this time if he has severe pyloric stenosis Continue omeprazole to 40 mg every other day Pancreatic fecal elastase levels were normal Encouraged him to increase his calorie intake by high protein diet, small frequent meals TSH, serum cortisol levels are normal Trial of FD guard for abdominal bloating, samples provided  Erosive esophagitis Continue omeprazole 40 mg daily or every other day, repeat EGD in December 2023 or 03/2022  History of small  bowel obstructions Fecal calprotectin levels are normal, colonoscopy with TI evaluation was normal in 02/2019.  No evidence of Crohn's disease or ulcerative colitis.    History of B12 deficiency, resolved No evidence of anemia Did have history of iron deficiency, s/p iron therapy Refill meds today  Follow up in 6 months   Cephas Darby, MD

## 2021-11-03 ENCOUNTER — Encounter: Payer: Self-pay | Admitting: Student in an Organized Health Care Education/Training Program

## 2021-11-03 ENCOUNTER — Ambulatory Visit
Payer: No Typology Code available for payment source | Attending: Student in an Organized Health Care Education/Training Program | Admitting: Student in an Organized Health Care Education/Training Program

## 2021-11-03 VITALS — BP 138/94 | HR 60 | Temp 97.2°F | Resp 16 | Ht 72.0 in | Wt 170.0 lb

## 2021-11-03 DIAGNOSIS — Z9889 Other specified postprocedural states: Secondary | ICD-10-CM | POA: Insufficient documentation

## 2021-11-03 DIAGNOSIS — G894 Chronic pain syndrome: Secondary | ICD-10-CM | POA: Insufficient documentation

## 2021-11-03 DIAGNOSIS — K5730 Diverticulosis of large intestine without perforation or abscess without bleeding: Secondary | ICD-10-CM | POA: Insufficient documentation

## 2021-11-03 DIAGNOSIS — R1084 Generalized abdominal pain: Secondary | ICD-10-CM | POA: Diagnosis present

## 2021-11-03 DIAGNOSIS — Z0289 Encounter for other administrative examinations: Secondary | ICD-10-CM | POA: Insufficient documentation

## 2021-11-03 MED ORDER — HYDROCODONE-ACETAMINOPHEN 5-325 MG PO TABS: 1.0000 | ORAL_TABLET | Freq: Every day | ORAL | 0 refills | Status: AC | PRN

## 2021-11-03 MED ORDER — HYDROCODONE-ACETAMINOPHEN 5-325 MG PO TABS: 1.0000 | ORAL_TABLET | Freq: Every day | ORAL | 0 refills | Status: DC | PRN

## 2021-11-03 NOTE — Progress Notes (Signed)
Nursing Pain Medication Assessment:  Safety precautions to be maintained throughout the outpatient stay will include: orient to surroundings, keep bed in low position, maintain call bell within reach at all times, provide assistance with transfer out of bed and ambulation.  Medication Inspection Compliance: Pill count conducted under aseptic conditions, in front of the patient. Neither the pills nor the bottle was removed from the patient's sight at any time. Once count was completed pills were immediately returned to the patient in their original bottle.  Medication: Hydrocodone/APAP Pill/Patch Count:  0 of 30 pills remain Pill/Patch Appearance:  no pills in bottle to assess Bottle Appearance: Standard pharmacy container. Clearly labeled. Filled Date: 07 / 18 / 2023 Last Medication intake:  Yesterday

## 2021-11-03 NOTE — Progress Notes (Signed)
PROVIDER NOTE: Information contained herein reflects review and annotations entered in association with encounter. Interpretation of such information and data should be left to medically-trained personnel. Information provided to patient can be located elsewhere in the medical record under "Patient Instructions". Document created using STT-dictation technology, any transcriptional errors that may result from process are unintentional.    Patient: [name]  Service Category: E/M  Provider: Gillis Santa, MD  DOB: [birth year]  DOS: 11/03/2021  Specialty: Interventional Pain Management  MRN: [id number]  Setting: Ambulatory outpatient  PCP: George Market, MD  Type: Established Patient    Referring Provider: Lorelee Market, MD  Location: Office  Delivery: Face-to-face     HPI  [name], a 51 y.o. year old male, is here today because of his Pain management contract signed [[email or web address]]. [name] primary complain today is Abdominal Pain Last encounter: My last encounter with him was on 08/06/21 Pertinent problems: [name] has History of abdominal surgery; Generalized abdominal pain; Chronic pain syndrome; Diverticular disease of colon; and Pain management contract signed on their pertinent problem list. Pain Assessment: Severity of Chronic pain is reported as a 8 /10. Location: Abdomen Lower/groin to upper abdomen. Onset: More than a month ago. Quality: Aching, Burning, Stabbing, Constant. Timing: Constant. Modifying factor(s): meds. Vitals:  height is 6' (1.829 m) and weight is 170 lb (77.1 kg). His temporal temperature is 97.2 F (36.2 C) (abnormal). His blood pressure is 138/94 (abnormal) and his pulse is 60. His respiration is 16 and oxygen saturation is 98%.   Reason for encounter: medication management.   Patient presents today for medication management.  No significant change in his medical history since her last clinic visit.  He is working and finds that taking 1 hydrocodone a  day helps his functional status and helps to manage his pain.     Pharmacotherapy Assessment  Analgesic: Hydrocodone 5 mg daily prn    Monitoring: [place] PMP: PDMP reviewed during this encounter.       Pharmacotherapy: No side-effects or adverse reactions reported. Compliance: No problems identified. Effectiveness: Clinically acceptable.  George Patience, RN  11/03/2021 10:59 AM  Sign when Signing Visit Nursing Pain Medication Assessment:  Safety precautions to be maintained throughout the outpatient stay will include: orient to surroundings, keep bed in low position, maintain call bell within reach at all times, provide assistance with transfer out of bed and ambulation.  Medication Inspection Compliance: Pill count conducted under aseptic conditions, in front of the patient. Neither the pills nor the bottle was removed from the patient's sight at any time. Once count was completed pills were immediately returned to the patient in their original bottle.  Medication: Hydrocodone/APAP Pill/Patch Count:  0 of 30 pills remain Pill/Patch Appearance:  no pills in bottle to assess Bottle Appearance: Standard pharmacy container. Clearly labeled. Filled Date: 07 / 18 / 2023 Last Medication intake:  Yesterday  UDS:  Summary  Date Value Ref Range Status  08/06/2021 Note  Final    Comment:    ==================================================================== [facility] (MW) ==================================================================== Test                             Result       Flag       Units  Drug Present and Declared for Prescription Verification   Hydrocodone                    943  EXPECTED   ng/mg creat   Norhydrocodone                 1343         EXPECTED   ng/mg creat    Sources of hydrocodone include scheduled prescription medications.    Norhydrocodone is an expected metabolite of  hydrocodone.  ==================================================================== Test                      Result    Flag   Units      Ref Range   Creatinine              40               mg/dL      >=20 ==================================================================== Declared Medications:  The flagging and interpretation on this report are based on the  following declared medications.  Unexpected results may arise from  inaccuracies in the declared medications.   **Note: The testing scope of this panel includes these medications:   Hydrocodone (Norco)   **Note: The testing scope of this panel does not include the  following reported medications:   Acetaminophen (Norco)  Dicyclomine (Bentyl)  Multivitamin  Omeprazole (Prilosec) ==================================================================== For clinical consultation, please call [phone]. ====================================================================      ROS  Constitutional: Denies any fever or chills Gastrointestinal:  Chronic abdominal pain Musculoskeletal:  Abdominal pain Neurological: No reported episodes of acute onset apraxia, aphasia, dysarthria, agnosia, amnesia, paralysis, loss of coordination, or loss of consciousness  Medication Review  HYDROcodone-acetaminophen, [facility], dicyclomine, and omeprazole  History Review  Allergy: [name] is allergic to prednisone. Drug: [name]  reports no history of drug use. Alcohol:  reports no history of alcohol use. Tobacco:  reports that he quit smoking about 6 years ago. His smoking use included cigarettes. He smoked an average of 2 packs per day. He has never used smokeless tobacco. Social: [name]  reports that he quit smoking about 6 years ago. His smoking use included cigarettes. He smoked an average of 2 packs per day. He has never used smokeless tobacco. He reports that he does not drink alcohol and does not use drugs. Medical:  has a  past medical history of Abdominal pain (2011), Collagen vascular disease ([place]), Diverticulitis, History of hiatal hernia, and Small bowel obstruction ([place]). Surgical: [name]  has a past surgical history that includes Colon resection; Cholecystectomy; arm surgery; loop ileostomy and repair of anastomotic leak at sigmoid colon (2011); ileostomy takedown (2011); Hemorrhoid surgery (N/A, 01/16/2019); Colonoscopy with propofol (N/A, 03/05/2019); Esophagogastroduodenoscopy (egd) with propofol (N/A, 03/05/2019); and Colon surgery (2011). Family: family history includes Heart disease in his father and mother.  Laboratory Chemistry Profile   Renal Lab Results  Component Value Date   BUN 14 03/14/2020   CREATININE 1.18 03/14/2020   BCR 10 12/20/2019   GFRAA 79 12/20/2019   GFRNONAA >60 03/14/2020    Hepatic Lab Results  Component Value Date   AST 23 03/14/2020   ALT 17 03/14/2020   ALBUMIN 4.3 03/14/2020   ALKPHOS 61 03/14/2020   AMYLASE 49 09/02/2007   LIPASE 24 03/14/2020    Electrolytes Lab Results  Component Value Date   NA 136 03/14/2020   K 3.6 03/14/2020   CL 102 03/14/2020   CALCIUM 9.7 03/14/2020   MG 2.0 12/02/2014   PHOS 2.4 (L) 12/02/2014    Bone Lab Results  Component Value Date   VD25OH 24.9 (L) 12/20/2019  Inflammation (CRP: Acute Phase) (ESR: Chronic Phase) Lab Results  Component Value Date   CRP <1 02/28/2019   LATICACIDVEN 1.8 03/14/2020         Note: Above Lab results reviewed.  Recent Imaging Review  CT ABDOMEN PELVIS W CONTRAST CLINICAL DATA:  Periumbilical pain since yesterday. History of multiple small bowel obstructions and surgeries.  EXAM: CT ABDOMEN AND PELVIS WITH CONTRAST  TECHNIQUE: Multidetector CT imaging of the abdomen and pelvis was performed using the standard protocol following bolus administration of intravenous contrast.  CONTRAST:  139mL OMNIPAQUE IOHEXOL 300 MG/ML  SOLN  COMPARISON:  02/12/2019 and plain films of  earlier today.  FINDINGS: Lower chest: clear lung bases. Normal heart size without pericardial or pleural effusion.  Hepatobiliary: Moderate hepatic steatosis. Cholecystectomy, without biliary ductal dilatation.  Pancreas: Normal, without mass or ductal dilatation.  Spleen: Normal in size, without focal abnormality.  Adrenals/Urinary Tract: Normal adrenal glands. Normal kidneys, without hydronephrosis. Normal urinary bladder.  Stomach/Bowel: Gastric body underdistention.  Normal caliber of the colon, which is primarily fluid-filled. Normal terminal ileum and appendix.  Pelvic small bowel loops are thickened with mild mucosal hyperenhancement, including on 74/2. Surrounding mesenteric edema and interloop fluid.  The small bowel just distal to this is borderline to mildly dilated, including at 2.8 cm on 62/2.  This small bowel dilatation continues to the level of enterotomy sutures. Adjacent to the enterotomy sutures is an area of relative hyperattenuation including at 1.4 cm on 54/2. This area is not imaged on kidney delays.  Vascular/Lymphatic: Aortic atherosclerosis. No abdominopelvic adenopathy.  Reproductive: Normal prostate.  Other: No pneumatosis or free intraperitoneal air.  Musculoskeletal: No acute osseous abnormality.  IMPRESSION: 1. Pelvic small bowel wall thickening and mucosal hyperenhancement with surrounding mesenteric edema, suspicious for enteritis. 2. Small bowel dilatation just distal to this area, continuing to the level of a small bowel surgical sutures. Cannot exclude concurrent low-grade partial small bowel obstruction, presumably secondary to adhesions. 3. Focus of hyperattenuation within the small bowel, immediately adjacent to the enterotomy site. Although this could simply represent enteric contents, a small-bowel lesion cannot be excluded. Consider follow-up with CT enterography at 2-3 weeks to confirm resolution of enteritis and exclude  small-bowel lesion in this area. 4. Hepatic steatosis. 5.  Aortic Atherosclerosis (ICD10-I70.0).  Electronically Signed   By: Abigail Miyamoto M.D.   On: 03/14/2020 12:03 DG Abdomen 1 View CLINICAL DATA:  Abdominal pain.  History of small-bowel obstruction.  EXAM: ABDOMEN - 1 VIEW  COMPARISON:  02/12/2019 CT  FINDINGS: Two supine views. Cholecystectomy clips. No gaseous distention of bowel loops. Clear lung bases. Mild right hemidiaphragm elevation. Presumed phleboliths in the pelvis.  IMPRESSION: No acute findings.  Electronically Signed   By: Abigail Miyamoto M.D.   On: 03/14/2020 09:34 Note: Reviewed        Physical Exam  General appearance: Well nourished, well developed, and well hydrated. In no apparent acute distress Mental status: Alert, oriented x 3 (person, place, & time)       Respiratory: No evidence of acute respiratory distress Eyes: PERLA Vitals: BP (!) 138/94   Pulse 60   Temp (!) 97.2 F (36.2 C) (Temporal)   Resp 16   Ht 6' (1.829 m)   Wt 170 lb (77.1 kg)   SpO2 98%   BMI 23.06 kg/m  BMI: Estimated body mass index is 23.06 kg/m as calculated from the following:   Height as of this encounter: 6' (1.829 m).   Weight as of  this encounter: 170 lb (77.1 kg). Ideal: Ideal body weight: 77.6 kg (171 lb 1.2 oz)  Assessment   Diagnosis Status  1. Pain management contract signed   2. Diverticular disease of colon   3. Generalized abdominal pain   4. History of abdominal surgery   5. Chronic pain syndrome    Controlled Controlled Controlled    Plan of Care   [name] has a current medication list which includes the following long-term medication(s): dicyclomine and omeprazole.  Pharmacotherapy (Medications Ordered): Meds ordered this encounter  Medications   HYDROcodone-acetaminophen (NORCO/VICODIN) 5-325 MG tablet    Sig: Take 1 tablet by mouth daily as needed for moderate pain.    Dispense:  30 tablet    Refill:  0    HYDROcodone-acetaminophen (NORCO/VICODIN) 5-325 MG tablet    Sig: Take 1 tablet by mouth daily as needed for moderate pain.    Dispense:  30 tablet    Refill:  0   HYDROcodone-acetaminophen (NORCO/VICODIN) 5-325 MG tablet    Sig: Take 1 tablet by mouth daily as needed for moderate pain.    Dispense:  30 tablet    Refill:  0   Orders:  No orders of the defined types were placed in this encounter.  Follow-up plan:   Return in about 3 months (around 02/03/2022) for Medication Management, in person.    Recent Visits Date Type Provider Dept  08/06/21 Office Visit George Santa, MD [facility]  Showing recent visits within past 90 days and meeting all other requirements Today's Visits Date Type Provider Dept  11/03/21 Office Visit George Santa, MD [facility]  Showing today's visits and meeting all other requirements Future Appointments Date Type Provider Dept  01/28/22 Appointment George Santa, MD [facility]  Showing future appointments within next 90 days and meeting all other requirements  I discussed the assessment and treatment plan with the patient. The patient was provided an opportunity to ask questions and all were answered. The patient agreed with the plan and demonstrated an understanding of the instructions.  Patient advised to call back or seek an in-person evaluation if the symptoms or condition worsens.  Duration of encounter: 36minutes.  Note by: George Santa, MD Date: 11/03/2021; Time: 11:37 AM

## 2022-01-28 ENCOUNTER — Encounter: Payer: Self-pay | Admitting: Student in an Organized Health Care Education/Training Program

## 2022-01-28 ENCOUNTER — Ambulatory Visit
Payer: No Typology Code available for payment source | Attending: Student in an Organized Health Care Education/Training Program | Admitting: Student in an Organized Health Care Education/Training Program

## 2022-01-28 VITALS — BP 137/88 | HR 61 | Temp 97.1°F | Ht 72.0 in | Wt 173.0 lb

## 2022-01-28 DIAGNOSIS — R1084 Generalized abdominal pain: Secondary | ICD-10-CM

## 2022-01-28 DIAGNOSIS — G894 Chronic pain syndrome: Secondary | ICD-10-CM | POA: Diagnosis present

## 2022-01-28 DIAGNOSIS — Z0289 Encounter for other administrative examinations: Secondary | ICD-10-CM | POA: Diagnosis present

## 2022-01-28 DIAGNOSIS — K5730 Diverticulosis of large intestine without perforation or abscess without bleeding: Secondary | ICD-10-CM | POA: Diagnosis present

## 2022-01-28 DIAGNOSIS — Z9889 Other specified postprocedural states: Secondary | ICD-10-CM | POA: Diagnosis present

## 2022-01-28 MED ORDER — HYDROCODONE-ACETAMINOPHEN 5-325 MG PO TABS: 1.0000 | ORAL_TABLET | Freq: Every day | ORAL | 0 refills | Status: DC | PRN

## 2022-01-28 MED ORDER — HYDROCODONE-ACETAMINOPHEN 5-325 MG PO TABS: 1.0000 | ORAL_TABLET | Freq: Every day | ORAL | 0 refills | Status: AC | PRN

## 2022-01-28 NOTE — Progress Notes (Signed)
Nursing Pain Medication Assessment:  Safety precautions to be maintained throughout the outpatient stay will include: orient to surroundings, keep bed in low position, maintain call bell within reach at all times, provide assistance with transfer out of bed and ambulation.  Medication Inspection Compliance: Pill count conducted under aseptic conditions, in front of the patient. Neither the pills nor the bottle was removed from the patient's sight at any time. Once count was completed pills were immediately returned to the patient in their original bottle.  Medication: See above Pill/Patch Count:  4 of 30 pills remain Pill/Patch Appearance: Markings consistent with prescribed medication Bottle Appearance: Standard pharmacy container. Clearly labeled. Filled Date: 10 / 14 / 2023 Last Medication intake:  TodaySafety precautions to be maintained throughout the outpatient stay will include: orient to surroundings, keep bed in low position, maintain call bell within reach at all times, provide assistance with transfer out of bed and ambulation.

## 2022-01-28 NOTE — Progress Notes (Signed)
PROVIDER NOTE: Information contained herein reflects review and annotations entered in association with encounter. Interpretation of such information and data should be left to medically-trained personnel. Information provided to patient can be located elsewhere in the medical record under "Patient Instructions". Document created using STT-dictation technology, any transcriptional errors that may result from process are unintentional.    Patient: [name]  Service Category: E/M  Provider: Gillis Santa, MD  DOB: Dec 20, 1970  DOS: 01/28/2022  Specialty: Interventional Pain Management  MRN: [id number]  Setting: Ambulatory outpatient  PCP: George Market, MD  Type: Established Patient    Referring Provider: Lorelee Market, MD  Location: Office  Delivery: Face-to-face     HPI  [name], a 51 y.o. year old male, is here today because of his Pain management contract signed [[email or web address]]. [name] primary complain today is Abdominal Pain Last encounter: My last encounter with him was on 08/06/21 Pertinent problems: [name] has History of abdominal surgery; Generalized abdominal pain; Chronic pain syndrome; Diverticular disease of colon; and Pain management contract signed on their pertinent problem list. Pain Assessment: Severity of Chronic pain is reported as a 6 /10. Location: Abdomen Medial/all over abdomen. Onset: More than a month ago. Quality: Sharp, Constant. Timing: Constant. Modifying factor(s): meds. Vitals:  height is 6' (1.829 m) and weight is 173 lb (78.5 kg). His temporal temperature is 97.1 F (36.2 C) (abnormal). His blood pressure is 137/88 and his pulse is 61. His oxygen saturation is 99%.   Reason for encounter: medication management.   Patient presents today for medication management.  No significant change in his medical history since her last clinic visit.  He is working and finds that taking 1 hydrocodone a day helps his functional status and helps to manage his  pain.     Pharmacotherapy Assessment  Analgesic: Hydrocodone 5 mg daily prn    Monitoring: [place] PMP: PDMP reviewed during this encounter.       Pharmacotherapy: No side-effects or adverse reactions reported. Compliance: No problems identified. Effectiveness: Clinically acceptable.  George Colt, RN  01/28/2022 11:30 AM  Sign when Signing Visit Nursing Pain Medication Assessment:  Safety precautions to be maintained throughout the outpatient stay will include: orient to surroundings, keep bed in low position, maintain call bell within reach at all times, provide assistance with transfer out of bed and ambulation.  Medication Inspection Compliance: Pill count conducted under aseptic conditions, in front of the patient. Neither the pills nor the bottle was removed from the patient's sight at any time. Once count was completed pills were immediately returned to the patient in their original bottle.  Medication: See above Pill/Patch Count:  4 of 30 pills remain Pill/Patch Appearance: Markings consistent with prescribed medication Bottle Appearance: Standard pharmacy container. Clearly labeled. Filled Date: 10 / 14 / 2023 Last Medication intake:  TodaySafety precautions to be maintained throughout the outpatient stay will include: orient to surroundings, keep bed in low position, maintain call bell within reach at all times, provide assistance with transfer out of bed and ambulation.     UDS:  Summary  Date Value Ref Range Status  08/06/2021 Note  Final    Comment:    ==================================================================== [facility] Select 13 (MW) ==================================================================== Test                             Result       Flag       Units  Drug Present and Declared for Prescription Verification   Hydrocodone                    943          EXPECTED   ng/mg creat   Norhydrocodone                 1343         EXPECTED   ng/mg creat     Sources of hydrocodone include scheduled prescription medications.    Norhydrocodone is an expected metabolite of hydrocodone.  ==================================================================== Test                      Result    Flag   Units      Ref Range   Creatinine              40               mg/dL      >=20 ==================================================================== Declared Medications:  The flagging and interpretation on this report are based on the  following declared medications.  Unexpected results may arise from  inaccuracies in the declared medications.   **Note: The testing scope of this panel includes these medications:   Hydrocodone (Norco)   **Note: The testing scope of this panel does not include the  following reported medications:   Acetaminophen (Norco)  Dicyclomine (Bentyl)  Multivitamin  Omeprazole (Prilosec) ==================================================================== For clinical consultation, please call [phone]. ====================================================================      ROS  Constitutional: Denies any fever or chills Gastrointestinal:  Chronic abdominal pain Musculoskeletal:  Abdominal pain Neurological: No reported episodes of acute onset apraxia, aphasia, dysarthria, agnosia, amnesia, paralysis, loss of coordination, or loss of consciousness  Medication Review  HYDROcodone-acetaminophen, [facility] Adult, dicyclomine, and omeprazole  History Review  Allergy: [name] is allergic to prednisone. Drug: [name]  reports no history of drug use. Alcohol:  reports no history of alcohol use. Tobacco:  reports that he quit smoking about 6 years ago. His smoking use included cigarettes. He smoked an average of 2 packs per day. He has never used smokeless tobacco. Social: [name]  reports that he quit smoking about 6 years ago. His smoking use included cigarettes. He smoked an average of 2 packs per day. He  has never used smokeless tobacco. He reports that he does not drink alcohol and does not use drugs. Medical:  has a past medical history of Abdominal pain (2011), Collagen vascular disease ([place]), Diverticulitis, History of hiatal hernia, and Small bowel obstruction ([place]). Surgical: [name]  has a past surgical history that includes Colon resection; Cholecystectomy; arm surgery; loop ileostomy and repair of anastomotic leak at sigmoid colon (2011); ileostomy takedown (2011); Hemorrhoid surgery (N/A, 01/16/2019); Colonoscopy with propofol (N/A, 03/05/2019); Esophagogastroduodenoscopy (egd) with propofol (N/A, 03/05/2019); and Colon surgery (2011). Family: family history includes Heart disease in his father and mother.  Laboratory Chemistry Profile   Renal Lab Results  Component Value Date   BUN 14 03/14/2020   CREATININE 1.18 03/14/2020   BCR 10 12/20/2019   GFRAA 79 12/20/2019   GFRNONAA >60 03/14/2020    Hepatic Lab Results  Component Value Date   AST 23 03/14/2020   ALT 17 03/14/2020   ALBUMIN 4.3 03/14/2020   ALKPHOS 61 03/14/2020   AMYLASE 49 09/02/2007   LIPASE 24 03/14/2020    Electrolytes Lab Results  Component Value Date   NA 136 03/14/2020   K  3.6 03/14/2020   CL 102 03/14/2020   CALCIUM 9.7 03/14/2020   MG 2.0 12/02/2014   PHOS 2.4 (L) 12/02/2014    Bone Lab Results  Component Value Date   VD25OH 24.9 (L) 12/20/2019    Inflammation (CRP: Acute Phase) (ESR: Chronic Phase) Lab Results  Component Value Date   CRP <1 02/28/2019   LATICACIDVEN 1.8 03/14/2020         Note: Above Lab results reviewed.  Recent Imaging Review  CT ABDOMEN PELVIS W CONTRAST CLINICAL DATA:  Periumbilical pain since yesterday. History of multiple small bowel obstructions and surgeries.  EXAM: CT ABDOMEN AND PELVIS WITH CONTRAST  TECHNIQUE: Multidetector CT imaging of the abdomen and pelvis was performed using the standard protocol following bolus administration  of intravenous contrast.  CONTRAST:  121m OMNIPAQUE IOHEXOL 300 MG/ML  SOLN  COMPARISON:  02/12/2019 and plain films of earlier today.  FINDINGS: Lower chest: clear lung bases. Normal heart size without pericardial or pleural effusion.  Hepatobiliary: Moderate hepatic steatosis. Cholecystectomy, without biliary ductal dilatation.  Pancreas: Normal, without mass or ductal dilatation.  Spleen: Normal in size, without focal abnormality.  Adrenals/Urinary Tract: Normal adrenal glands. Normal kidneys, without hydronephrosis. Normal urinary bladder.  Stomach/Bowel: Gastric body underdistention.  Normal caliber of the colon, which is primarily fluid-filled. Normal terminal ileum and appendix.  Pelvic small bowel loops are thickened with mild mucosal hyperenhancement, including on 74/2. Surrounding mesenteric edema and interloop fluid.  The small bowel just distal to this is borderline to mildly dilated, including at 2.8 cm on 62/2.  This small bowel dilatation continues to the level of enterotomy sutures. Adjacent to the enterotomy sutures is an area of relative hyperattenuation including at 1.4 cm on 54/2. This area is not imaged on kidney delays.  Vascular/Lymphatic: Aortic atherosclerosis. No abdominopelvic adenopathy.  Reproductive: Normal prostate.  Other: No pneumatosis or free intraperitoneal air.  Musculoskeletal: No acute osseous abnormality.  IMPRESSION: 1. Pelvic small bowel wall thickening and mucosal hyperenhancement with surrounding mesenteric edema, suspicious for enteritis. 2. Small bowel dilatation just distal to this area, continuing to the level of a small bowel surgical sutures. Cannot exclude concurrent low-grade partial small bowel obstruction, presumably secondary to adhesions. 3. Focus of hyperattenuation within the small bowel, immediately adjacent to the enterotomy site. Although this could simply represent enteric contents, a small-bowel  lesion cannot be excluded. Consider follow-up with CT enterography at 2-3 weeks to confirm resolution of enteritis and exclude small-bowel lesion in this area. 4. Hepatic steatosis. 5.  Aortic Atherosclerosis (ICD10-I70.0).  Electronically Signed   By: KAbigail MiyamotoM.D.   On: 03/14/2020 12:03 DG Abdomen 1 View CLINICAL DATA:  Abdominal pain.  History of small-bowel obstruction.  EXAM: ABDOMEN - 1 VIEW  COMPARISON:  02/12/2019 CT  FINDINGS: Two supine views. Cholecystectomy clips. No gaseous distention of bowel loops. Clear lung bases. Mild right hemidiaphragm elevation. Presumed phleboliths in the pelvis.  IMPRESSION: No acute findings.  Electronically Signed   By: KAbigail MiyamotoM.D.   On: 03/14/2020 09:34 Note: Reviewed        Physical Exam  General appearance: Well nourished, well developed, and well hydrated. In no apparent acute distress Mental status: Alert, oriented x 3 (person, place, & time)       Respiratory: No evidence of acute respiratory distress Eyes: PERLA Vitals: BP 137/88 (BP Location: Right Arm, Patient Position: Sitting, Cuff Size: Normal)   Pulse 61   Temp (!) 97.1 F (36.2 C) (Temporal)   Ht 6' (  1.829 m)   Wt 173 lb (78.5 kg)   SpO2 99%   BMI 23.46 kg/m  BMI: Estimated body mass index is 23.46 kg/m as calculated from the following:   Height as of this encounter: 6' (1.829 m).   Weight as of this encounter: 173 lb (78.5 kg). Ideal: Ideal body weight: 77.6 kg (171 lb 1.2 oz) Adjusted ideal body weight: 77.9 kg (171 lb 13.5 oz)  Assessment   Diagnosis Status  1. Pain management contract signed   2. Diverticular disease of colon   3. Generalized abdominal pain   4. History of abdominal surgery   5. Chronic pain syndrome     Controlled Controlled Controlled    Plan of Care   [name] has a current medication list which includes the following long-term medication(s): dicyclomine and omeprazole.  Pharmacotherapy (Medications  Ordered): Meds ordered this encounter  Medications   HYDROcodone-acetaminophen (NORCO/VICODIN) 5-325 MG tablet    Sig: Take 1 tablet by mouth daily as needed for moderate pain.    Dispense:  30 tablet    Refill:  0   HYDROcodone-acetaminophen (NORCO/VICODIN) 5-325 MG tablet    Sig: Take 1 tablet by mouth daily as needed for moderate pain.    Dispense:  30 tablet    Refill:  0   HYDROcodone-acetaminophen (NORCO/VICODIN) 5-325 MG tablet    Sig: Take 1 tablet by mouth daily as needed for moderate pain.    Dispense:  30 tablet    Refill:  0   Orders:  No orders of the defined types were placed in this encounter.  Follow-up plan:   Return in about 3 months (around 04/30/2022) for Medication Management, in person.    Recent Visits Date Type Provider Dept  11/03/21 Office Visit George Santa, MD [facility]  Showing recent visits within past 90 days and meeting all other requirements Today's Visits Date Type Provider Dept  01/28/22 Office Visit George Santa, MD [facility]  Showing today's visits and meeting all other requirements Future Appointments No visits were found meeting these conditions. Showing future appointments within next 90 days and meeting all other requirements  I discussed the assessment and treatment plan with the patient. The patient was provided an opportunity to ask questions and all were answered. The patient agreed with the plan and demonstrated an understanding of the instructions.  Patient advised to call back or seek an in-person evaluation if the symptoms or condition worsens.  Duration of encounter: 5mnutes.  Note by: BGillis Santa MD Date: 01/28/2022; Time: 11:46 AM

## 2022-02-12 ENCOUNTER — Emergency Department: Payer: No Typology Code available for payment source

## 2022-02-12 ENCOUNTER — Inpatient Hospital Stay
Admission: EM | Admit: 2022-02-12 | Discharge: 2022-02-12 | DRG: 390 | Discharge status: Against Medical Advice | Payer: No Typology Code available for payment source | Attending: Internal Medicine | Admitting: Internal Medicine

## 2022-02-12 ENCOUNTER — Other Ambulatory Visit: Payer: Self-pay

## 2022-02-12 ENCOUNTER — Encounter: Payer: Self-pay | Admitting: Emergency Medicine

## 2022-02-12 DIAGNOSIS — Z79899 Other long term (current) drug therapy: Secondary | ICD-10-CM

## 2022-02-12 DIAGNOSIS — Z87891 Personal history of nicotine dependence: Secondary | ICD-10-CM | POA: Diagnosis not present

## 2022-02-12 DIAGNOSIS — K56600 Partial intestinal obstruction, unspecified as to cause: Secondary | ICD-10-CM

## 2022-02-12 DIAGNOSIS — K219 Gastro-esophageal reflux disease without esophagitis: Secondary | ICD-10-CM | POA: Diagnosis present

## 2022-02-12 DIAGNOSIS — Z9049 Acquired absence of other specified parts of digestive tract: Secondary | ICD-10-CM | POA: Diagnosis not present

## 2022-02-12 DIAGNOSIS — K567 Ileus, unspecified: Principal | ICD-10-CM | POA: Diagnosis present

## 2022-02-12 DIAGNOSIS — Z5321 Procedure and treatment not carried out due to patient leaving prior to being seen by health care provider: Secondary | ICD-10-CM | POA: Diagnosis present

## 2022-02-12 DIAGNOSIS — G894 Chronic pain syndrome: Secondary | ICD-10-CM | POA: Diagnosis present

## 2022-02-12 LAB — COMPREHENSIVE METABOLIC PANEL
ALT: 18 U/L (ref 0–44)
AST: 24 U/L (ref 15–41)
Albumin: 4.3 g/dL (ref 3.5–5.0)
Alkaline Phosphatase: 46 U/L (ref 38–126)
Anion gap: 11 (ref 5–15)
BUN: 18 mg/dL (ref 6–20)
CO2: 24 mmol/L (ref 22–32)
Calcium: 9.5 mg/dL (ref 8.9–10.3)
Chloride: 105 mmol/L (ref 98–111)
Creatinine, Ser: 1 mg/dL (ref 0.61–1.24)
GFR, Estimated: >60 mL/min (ref >60)
Glucose, Bld: 156 mg/dL — ABNORMAL HIGH (ref 70–99)
Potassium: 3.6 mmol/L (ref 3.5–5.1)
Sodium: 140 mmol/L (ref 135–145)
Total Bilirubin: 2 mg/dL — ABNORMAL HIGH (ref 0.3–1.2)
Total Protein: 8.3 g/dL — ABNORMAL HIGH (ref 6.5–8.1)

## 2022-02-12 LAB — CBC
HCT: 45.6 % (ref 39.0–52.0)
Hemoglobin: 15.4 g/dL (ref 13.0–17.0)
MCH: 29.9 pg (ref 26.0–34.0)
MCHC: 33.8 g/dL (ref 30.0–36.0)
MCV: 88.5 fL (ref 80.0–100.0)
Platelets: 297 10*3/uL (ref 150–400)
RBC: 5.15 MIL/uL (ref 4.22–5.81)
RDW: 12.4 % (ref 11.5–15.5)
WBC: 15.1 10*3/uL — ABNORMAL HIGH (ref 4.0–10.5)
nRBC: 0 % (ref 0.0–0.2)

## 2022-02-12 LAB — LIPASE, BLOOD: Lipase: 29 U/L (ref 11–51)

## 2022-02-12 MED ORDER — ENOXAPARIN SODIUM 40 MG/0.4ML IJ SOSY: 40.0000 mg | PREFILLED_SYRINGE | INTRAMUSCULAR | Status: DC

## 2022-02-12 MED ORDER — SODIUM CHLORIDE 0.9 % IV SOLN: INTRAVENOUS | Status: DC

## 2022-02-12 MED ORDER — MORPHINE SULFATE (PF) 4 MG/ML IV SOLN
4.0000 mg | Freq: Once | INTRAVENOUS | Status: AC
  Administered 2022-02-12: 4 mg via INTRAVENOUS
  Filled 2022-02-12: qty 1

## 2022-02-12 MED ORDER — SODIUM CHLORIDE 0.9 % IV BOLUS
1000.0000 mL | Freq: Once | INTRAVENOUS | Status: AC
  Administered 2022-02-12: 1000 mL via INTRAVENOUS

## 2022-02-12 MED ORDER — IOHEXOL 300 MG/ML  SOLN
100.0000 mL | Freq: Once | INTRAMUSCULAR | Status: AC | PRN
  Administered 2022-02-12: 100 mL via INTRAVENOUS

## 2022-02-12 MED ORDER — ALBUTEROL SULFATE (2.5 MG/3ML) 0.083% IN NEBU: 2.5000 mg | INHALATION_SOLUTION | RESPIRATORY_TRACT | Status: DC | PRN

## 2022-02-12 MED ORDER — ONDANSETRON HCL 4 MG PO TABS: 4.0000 mg | ORAL_TABLET | Freq: Four times a day (QID) | ORAL | Status: DC | PRN

## 2022-02-12 MED ORDER — OXYCODONE HCL 5 MG PO TABS: 5.0000 mg | ORAL_TABLET | ORAL | Status: DC | PRN

## 2022-02-12 MED ORDER — POLYETHYLENE GLYCOL 3350 17 G PO PACK: 17.0000 g | PACK | Freq: Every day | ORAL | Status: DC | PRN

## 2022-02-12 MED ORDER — ONDANSETRON HCL 4 MG/2ML IJ SOLN: 4.0000 mg | Freq: Four times a day (QID) | INTRAMUSCULAR | Status: DC | PRN

## 2022-02-12 MED ORDER — ONDANSETRON HCL 4 MG/2ML IJ SOLN
4.0000 mg | Freq: Once | INTRAMUSCULAR | Status: AC
  Administered 2022-02-12: 4 mg via INTRAVENOUS
  Filled 2022-02-12: qty 2

## 2022-02-12 MED ORDER — MORPHINE SULFATE (PF) 2 MG/ML IV SOLN: 2.0000 mg | INTRAVENOUS | Status: DC | PRN

## 2022-02-12 NOTE — ED Notes (Signed)
Pt stated, "I have a small bowel obstruction. I need some IV fluids, some Dilaudid, then I need somewhere to lay down, and when I wake up I need some scans done." Pt asking for h2o in triage. Pt made aware that he could not have any h2o until results were back.  Pt unable to urinate at this time. Pt given specimen cup for when is able to do so.

## 2022-02-12 NOTE — ED Notes (Signed)
Pt told Clinical research associate he wanted to be d/c'd, Clinical research associate notified MD Allena Katz. Pt asked to wait until MD can speak with him to leave. Pt stating his ride is already on the way and he is leaving once they arrive. MD Allena Katz notified, pt left AMA.

## 2022-02-12 NOTE — Consult Note (Signed)
[facility] SURGICAL CONSULTATION NOTE (initial) - cpt: 34196   HISTORY OF PRESENT ILLNESS (HPI):  51 y.o. male presented to [facility] ED today for evaluation of partial small bowel obstruction. 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  Very well versed at what to expect, clearly not interested in any surgical fix understanding pros and cons of surgical intervention in this situation. He suspects that it was a combination of some frying oil, and perhaps a little alcohol yesterday that may have exacerbated this event.  He reports he had several episodes of vomiting overnight, while still passing flatus yet this morning.  His last bowel movement was about 3 PM yesterday, which was loose.  Noted has had some pain medication, he feels significantly better.  To the point where he admits he may not spend the night. Reports the last attack was 6 months ago.  Surgery is consulted by ED physician Dr. Joice Lofts in this context for evaluation and management of recurrent partial small bowel obstruction.  PAST MEDICAL HISTORY (PMH):  Past Medical History:  Diagnosis Date   Abdominal pain 2011   Collagen vascular disease (HCC)    Diverticulitis    History of hiatal hernia    Small bowel obstruction (HCC)      PAST SURGICAL HISTORY (PSH):  Past Surgical History:  Procedure Laterality Date   arm surgery     CHOLECYSTECTOMY     COLON RESECTION     COLON SURGERY  2011   small bowel resection   COLONOSCOPY WITH PROPOFOL N/A 03/05/2019   Procedure: COLONOSCOPY WITH PROPOFOL;  Surgeon: Toney Reil, MD;  Location: [facility] ENDOSCOPY;  Service: Gastroenterology;  Laterality: N/A;   ESOPHAGOGASTRODUODENOSCOPY (EGD) WITH PROPOFOL N/A 03/05/2019   Procedure: ESOPHAGOGASTRODUODENOSCOPY (EGD) WITH PROPOFOL;  Surgeon: Toney Reil, MD;  Location: [facility] ENDOSCOPY;  Service:  Gastroenterology;  Laterality: N/A;   HEMORRHOID SURGERY N/A 01/16/2019   Procedure: HEMORRHOIDECTOMY;  Surgeon: Leafy Ro, MD;  Location: [facility] ORS;  Service: General;  Laterality: N/A;   ileostomy takedown  2011   loop ileostomy and repair of anastomotic leak at sigmoid colon  2011     MEDICATIONS:  Prior to Admission medications   Medication Sig Start Date End Date Taking? Authorizing Provider  dicyclomine (BENTYL) 20 MG tablet Take 1 tablet (20 mg total) by mouth 3 (three) times daily before meals. 10/12/21   Toney Reil, MD  HYDROcodone-acetaminophen (NORCO/VICODIN) 5-325 MG tablet Take 1 tablet by mouth daily as needed for moderate pain. 02/01/22 03/03/22  Edward Jolly, MD  HYDROcodone-acetaminophen (NORCO/VICODIN) 5-325 MG tablet Take 1 tablet by mouth daily as needed for moderate pain. 03/03/22 04/02/22  Edward Jolly, MD  HYDROcodone-acetaminophen (NORCO/VICODIN) 5-325 MG tablet Take 1 tablet by mouth daily as needed for moderate pain. 04/02/22 05/02/22  Edward Jolly, MD  Multiple Vitamins-Minerals (SENTRY ADULT) TABS Take by mouth.    [provider]  omeprazole (PRILOSEC) 40 MG capsule Take 1 capsule (40 mg total) by mouth daily before breakfast. 10/12/21   Vanga, Loel Dubonnet, MD     ALLERGIES:  Allergies  Allergen Reactions   Prednisone Other (See Comments)    angry     SOCIAL HISTORY:  Social History   Socioeconomic History   Marital status: Significant Other    Spouse name: Raynelle Fanning   Number of children: Not on file   Years of education: Not on file  Highest education level: Not on file  Occupational History   Occupation: boss at work  Tobacco Use   Smoking status: Former    Packs/day: 2.00    Types: Cigarettes    Quit date: 2017    Years since quitting: 6.8   Smokeless tobacco: Never  Vaping Use   Vaping Use: Former   Devices: rare use  Substance and Sexual Activity   Alcohol use: No   Drug use: No   Sexual activity: Yes  Other Topics  Concern   Not on file  Social History Narrative   Not on file   Social Determinants of Health   Financial Resource Strain: Not on file  Food Insecurity: Not on file  Transportation Needs: Not on file  Physical Activity: Not on file  Stress: Not on file  Social Connections: Not on file  Intimate Partner Violence: Not on file     FAMILY HISTORY:  Family History  Problem Relation Age of Onset   Heart disease Mother    Heart disease Father    Mental illness Neg Hx     VITAL SIGNS:  Temp:  [97.9 F (36.6 C)] 97.9 F (36.6 C) (11/24 0644) Pulse Rate:  [96] 96 (11/24 0644) Resp:  [18] 18 (11/24 0644) BP: (119)/(78) 119/78 (11/24 0644) SpO2:  [98 %] 98 % (11/24 0644) Weight:  [78.5 kg-79 kg] 78.5 kg (11/24 0644)     Height: 6' (182.9 cm) Weight: 78.5 kg BMI (Calculated): 23.46   INTAKE/OUTPUT:  No intake/output data recorded.  PHYSICAL EXAM:  Physical Exam Blood pressure 119/78, pulse 96, temperature 97.9 F (36.6 C), temperature source Oral, resp. rate 18, height 6' (1.829 m), weight 78.5 kg, SpO2 98 %. Last Weight  Most recent update: 02/12/2022  6:44 AM    Weight  78.5 kg (173 lb)             CONSTITUTIONAL: Well developed, and nourished, appropriately responsive and aware without distress.   EYES: Sclera non-icteric.   EARS, NOSE, MOUTH AND THROAT:  The oropharynx is clear. Oral mucosa is pink and moist.    Hearing is intact to voice.  NECK: Trachea is midline, and there is no jugular venous distension.  LYMPH NODES:  Lymph nodes in the neck are not enlarged. RESPIRATORY:  Lungs are clear, and breath sounds are equal bilaterally. Normal respiratory effort without pathologic use of accessory muscles. CARDIOVASCULAR: Heart is regular in rate and rhythm. GI: The abdomen is notable for midline and right lower quadrant transverse scar.  Soft, nontender, and nondistended. There were no palpable masses. I did not appreciate hepatosplenomegaly. There were normal bowel  sounds. MUSCULOSKELETAL:  Symmetrical muscle tone appreciated in all four extremities.    SKIN: Skin turgor is normal. No pathologic skin lesions appreciated.  NEUROLOGIC:  Motor and sensation appear grossly normal.  Cranial nerves are grossly without defect. PSYCH:  Alert and oriented to person, place and time. Affect is appropriate for situation.  Data Reviewed I have personally reviewed what is currently available of the patient's imaging, recent labs and medical records.    Labs:     Latest Ref Rng & Units 02/12/2022    6:45 AM 12/01/2020    4:01 PM 03/14/2020    7:45 AM  CBC  WBC 4.0 - 10.5 K/uL 15.1  6.8  15.0   Hemoglobin 13.0 - 17.0 g/dL 98.9  21.1  94.1   Hematocrit 39.0 - 52.0 % 45.6  43.3  43.7   Platelets 150 -  400 K/uL 297  293  349       Latest Ref Rng & Units 02/12/2022    6:45 AM 03/14/2020    7:45 AM 12/20/2019    2:20 PM  CMP  Glucose 70 - 99 mg/dL 098156  119113  147100   BUN 6 - 20 mg/dL 18  14  12    Creatinine 0.61 - 1.24 mg/dL 8.291.00  5.621.18  1.301.22   Sodium 135 - 145 mmol/L 140  136  140   Potassium 3.5 - 5.1 mmol/L 3.6  3.6  4.0   Chloride 98 - 111 mmol/L 105  102  103   CO2 22 - 32 mmol/L 24  21  23    Calcium 8.9 - 10.3 mg/dL 9.5  9.7  8.9   Total Protein 6.5 - 8.1 g/dL 8.3  8.7  7.2   Total Bilirubin 0.3 - 1.2 mg/dL 2.0  1.1  0.7   Alkaline Phos 38 - 126 U/L 46  61  82   AST 15 - 41 U/L 24  23  14    ALT 0 - 44 U/L 18  17  12       Imaging studies:   Last 24 hrs: CT ABDOMEN PELVIS W CONTRAST  Result Date: 02/12/2022 CLINICAL DATA:  Generalized abdominal pain which is acute since last night. History of small-bowel obstructions. EXAM: CT ABDOMEN AND PELVIS WITH CONTRAST TECHNIQUE: Multidetector CT imaging of the abdomen and pelvis was performed using the standard protocol following bolus administration of intravenous contrast. RADIATION DOSE REDUCTION: This exam was performed according to the departmental dose-optimization program which includes automated exposure  control, adjustment of the mA and/or kV according to patient size and/or use of iterative reconstruction technique. CONTRAST:  100mL OMNIPAQUE IOHEXOL 300 MG/ML  SOLN COMPARISON:  03/14/2020 FINDINGS: Lower chest: Lung bases are normal. Possible small sliding hiatal hernia. Hepatobiliary: Previous cholecystectomy. Liver and biliary tree are normal. Pancreas: Normal. Spleen: Normal. Adrenals/Urinary Tract: Adrenal glands are normal. Kidneys are normal size without focal mass, hydronephrosis or nephrolithiasis. Ureters and bladder are normal. Stomach/Bowel: Possible small sliding hiatal hernia as the stomach is otherwise unremarkable. Small bowel demonstrates a surgical suture line over the ileum in the right lower quadrant. There is a second suture line over a loop of small bowel also in the right lower quadrant more towards the midline as this loop of bowel is chronically dilated and measures 4 cm in diameter. There is an abrupt transition of this chronic dilated postsurgical loop to a normal caliber segment of ileum in the right lower quadrant. There is interval worsening of fluid-filled dilated small bowel loops in the pelvis measuring up to 3.4 cm in diameter. There is an abrupt transition of normal caliber jejunal loops to this more distally dilated segment in the left mid to lower abdomen. There is wall thickening of the small bowel segment just proximal to these dilated small bowel loops and abrupt transition. Findings may be due to an underlying enteritis. Colon is unremarkable. Appendix is normal. Vascular/Lymphatic: Minimal calcified plaque over the abdominal aorta which is normal in caliber. No significant adenopathy. Reproductive: Normal. Other: Tiny amount of free fluid in the pelvis. No free peritoneal air. Musculoskeletal: No focal abnormality. IMPRESSION: 1. Evidence of previous small-bowel surgery in the right lower quadrant. There is interval worsening of fluid-filled dilated small bowel loops in  the pelvis measuring up to 3.4 cm in diameter. There is wall thickening of a short segment of small bowel in the left mid  to lower abdomen just proximal to these dilated small bowel loops. Findings may be due to early/partial small bowel obstruction and may be related to an underlying enteritis. Tiny amount of free fluid in the pelvis. 2. Possible small sliding hiatal hernia. 3. Aortic atherosclerosis. Aortic Atherosclerosis (ICD10-I70.0). Electronically Signed   By: Elberta Fortis M.D.   On: 02/12/2022 08:31     Assessment/Plan:  51 y.o. 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  -I do not believe an NG tube is warranted at this time considering the lack of dilation of proximal small bowel, and an empty stomach.  -PPI prophylaxis, we anticipate following with you.  - DVT prophylaxis  All of the above findings and recommendations were discussed with the patient, and all of patient's questions were answered to their expressed satisfaction.  Thank you for the opportunity to participate in this patient's care.   -- Campbell Lerner, M.D., FACS 02/12/2022, 11:32 AM

## 2022-02-12 NOTE — ED Triage Notes (Signed)
Pt presents via EMS with complaints of AP with associated N/V since 2100 yesterday. Hx of SBO. Received 4mg  zofran from EMS via 18G in LAC. Denies CP or SOB.   VS with EMS.  176/100 70HR 184 CBG

## 2022-02-12 NOTE — ED Notes (Signed)
Pt called for CT scan but not answering. This Clinical research associate called pt's cellphone, no answer.

## 2022-02-12 NOTE — Progress Notes (Signed)
Patient left the emergency room against medical advice before me seeing patient.  No discharge summary or H and P  No charge

## 2022-02-12 NOTE — ED Provider Notes (Signed)
[facility] Provider Note    Event Date/Time   First MD Initiated Contact with Patient 02/12/22 [phone]     (approximate)   History   Abdominal Pain   HPI  [name] is a 51 y.o. male with a past medical history of complicated abdominal surgery including sigmoid colectomy on 07/19/2009 complicated by anastomotic leak of the sigmoid colon with abscesses and fistula resulting in repair of the anastomotic leak, diverting loop ileostomy and small bowel resection on 09/08/2009 with takedown of loop ileostomy on 11/20/2009, with multiple partial SBO who presents today for evaluation of abdominal pain, nausea, vomiting.  Patient reports that he is still passing flatus.  He had a loose bowel movement yesterday at around 3:00.  He reports that his pain began around 7:30 PM last night.  He ate [place] yesterday.  He has had several episodes of vomiting overnight.  He is still passing flatus.  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   History of abdominal surgery 06/24/2020   Generalized abdominal pain 06/24/2020   Chronic pain syndrome 06/24/2020   Adult pyloric stenosis    Gastroesophageal reflux disease with esophagitis without hemorrhage    Schatzki's ring    Dyspepsia           Physical Exam   Triage Vital Signs: ED Triage Vitals  Enc Vitals Group     BP 02/12/22 0644 119/78     Pulse Rate 02/12/22 0644 96     Resp 02/12/22 0644 18     Temp 02/12/22 0644 97.9 F (36.6 C)     Temp Source 02/12/22 0644 Oral     SpO2 02/12/22 0644 98 %     Weight 02/12/22 0637 174 lb 2.6 oz (79 kg)     Height 02/12/22 0637 6' (1.829 m)     Head Circumference --      Peak Flow --      Pain Score --      Pain Loc --      Pain Edu? --      Excl. in GC? --     Most recent vital signs: Vitals:   02/12/22 0644  BP: 119/78  Pulse: 96  Resp: 18   Temp: 97.9 F (36.6 C)  SpO2: 98%    Physical Exam Vitals and nursing note reviewed.  Constitutional:      General: Awake and alert. Uncomfortable appearing    Appearance: Normal appearance. The patient is normal weight.  HENT:     Head: Normocephalic and atraumatic.     Mouth: Mucous membranes are moist.  Eyes:     General: PERRL. Normal EOMs        Right eye: No discharge.        Left eye: No discharge.     Conjunctiva/sclera: Conjunctivae normal.  Cardiovascular:     Rate and Rhythm: Normal rate and regular rhythm.     Pulses: Normal pulses.  Pulmonary:     Effort: Pulmonary effort is normal. No respiratory distress.     Breath sounds: Normal breath sounds.  Abdominal:     Abdomen is soft. There is diffuse abdominal tenderness. No rebound or guarding. No distention.  Large well-healed midline surgical scar  Musculoskeletal:        General: No swelling. Normal range of motion.     Cervical back: Normal range  of motion and neck supple.  Skin:    General: Skin is warm and dry.     Capillary Refill: Capillary refill takes less than 2 seconds.     Findings: No rash.  Neurological:     Mental Status: The patient is awake and alert.      ED Results / Procedures / Treatments   Labs (all labs ordered are listed, but only abnormal results are displayed) Labs Reviewed  COMPREHENSIVE METABOLIC PANEL - Abnormal; Notable for the following components:      Result Value   Glucose, Bld 156 (*)    Total Protein 8.3 (*)    Total Bilirubin 2.0 (*)    All other components within normal limits  CBC - Abnormal; Notable for the following components:   WBC 15.1 (*)    All other components within normal limits  LIPASE, BLOOD  URINALYSIS, ROUTINE W REFLEX MICROSCOPIC     EKG     RADIOLOGY I independently reviewed and interpreted imaging and agree with radiologists findings.     PROCEDURES:  Critical Care performed:   Procedures   MEDICATIONS ORDERED IN  ED: Medications  iohexol (OMNIPAQUE) 300 MG/ML solution 100 mL (100 mLs Intravenous Contrast Given 02/12/22 0749)  morphine (PF) 4 MG/ML injection 4 mg (4 mg Intravenous Given 02/12/22 0854)  ondansetron (ZOFRAN) injection 4 mg (4 mg Intravenous Given 02/12/22 0854)  sodium chloride 0.9 % bolus 1,000 mL (1,000 mLs Intravenous New Bag/Given 02/12/22 0853)     IMPRESSION / MDM / ASSESSMENT AND PLAN / ED COURSE  I reviewed the triage vital signs and the nursing notes.   Differential diagnosis includes, but is not limited to, small bowel obstruction, partial small bowel obstruction, large bowel obstruction, diverticulitis, gastroenteritis.  Patient is awake and alert, hemodynamically stable and afebrile.  He is uncomfortable in appearance.    I reviewed the patient's chart, has a pain management contract as well as history of multiple abdominal surgeries.  Labs obtained in triage are revealing for a leukocytosis to 15.  Patient was treated symptomatically with Zofran and morphine.  He was also given a liter of IV fluids.  CT scan demonstrates a partial SBO.  I discussed this with Dr. Claudine Mouton who reviewed the images and agrees with admission to the hospitalist service, with general surgery to consult.  Patient is already decompressed, Dr. Claudine Mouton recommends holding off for now on the NG tube.  He recommends continuing IV fluids as he will likely be n.p.o. for a couple of days.  Patient was made n.p.o. status.  Patient was accepted by the hospitalist, Dr. Allena Katz.  Patient agrees with plan for admission.  Patient was also seen and evaluated by Dr. Fuller Plan who agrees with assessment and plan.   Patient's presentation is most consistent with acute complicated illness / injury requiring diagnostic workup.   Clinical Course as of 02/12/22 1003  Fri Feb 12, 2022  6644 General surgery paged [JP]  0840 Per Dr. Claudine Mouton, admit to the hospitalist.  No NG tube for now given that he is already  decompressed.  He will consult during admission.  N.p.o. [JP]    Clinical Course User Index [JP] Deyton Ellenbecker, Herb Grays, PA-C     FINAL CLINICAL IMPRESSION(S) / ED DIAGNOSES   Final diagnoses:  Partial small bowel obstruction (HCC)     Rx / DC Orders   ED Discharge Orders     None        Note:  This document was prepared using  [employer]agon [employer] and may include unintentional dictation errors.   Keturah Shavers 02/12/22 1004    Concha Se, MD 02/12/22 1453

## 2022-04-22 ENCOUNTER — Encounter: Payer: Self-pay | Admitting: Student in an Organized Health Care Education/Training Program

## 2022-04-22 ENCOUNTER — Ambulatory Visit
Payer: No Typology Code available for payment source | Attending: Student in an Organized Health Care Education/Training Program | Admitting: Student in an Organized Health Care Education/Training Program

## 2022-04-22 VITALS — BP 139/80 | HR 63 | Temp 98.1°F | Resp 16 | Ht 72.0 in | Wt 175.0 lb

## 2022-04-22 DIAGNOSIS — Z0289 Encounter for other administrative examinations: Secondary | ICD-10-CM | POA: Diagnosis present

## 2022-04-22 DIAGNOSIS — R1084 Generalized abdominal pain: Secondary | ICD-10-CM

## 2022-04-22 DIAGNOSIS — K5730 Diverticulosis of large intestine without perforation or abscess without bleeding: Secondary | ICD-10-CM | POA: Diagnosis present

## 2022-04-22 DIAGNOSIS — Z9889 Other specified postprocedural states: Secondary | ICD-10-CM | POA: Diagnosis present

## 2022-04-22 DIAGNOSIS — G894 Chronic pain syndrome: Secondary | ICD-10-CM | POA: Insufficient documentation

## 2022-04-22 MED ORDER — HYDROCODONE-ACETAMINOPHEN 5-325 MG PO TABS: 1.0000 | ORAL_TABLET | Freq: Three times a day (TID) | ORAL | 0 refills | Status: AC | PRN

## 2022-04-22 NOTE — Progress Notes (Signed)
PROVIDER NOTE: Information contained herein reflects review and annotations entered in association with encounter. Interpretation of such information and data should be left to medically-trained personnel. Information provided to patient can be located elsewhere in the medical record under "Patient Instructions". Document created using STT-dictation technology, any transcriptional errors that may result from process are unintentional.    Patient: [name]  Service Category: E/M  Provider: Gillis Santa, MD  DOB: [birth year]  DOS: 04/22/2022  Specialty: Interventional Pain Management  MRN: [id number]  Setting: Ambulatory outpatient  PCP: Patient, No Pcp Per  Type: Established Patient    Referring Provider: Lorelee Market, MD  Location: Office  Delivery: Face-to-face     HPI  [name], a 52 y.o. year old male, is here today because of his Pain management contract signed [Z02.89]. Mr. Fayson primary complain today is Abdominal Pain Last encounter: My last encounter with him was on 01/28/22 Pertinent problems: [name] has History of abdominal surgery; Generalized abdominal pain; Chronic pain syndrome; Diverticular disease of colon; and Pain management contract signed on their pertinent problem list. Pain Assessment: Severity of Chronic pain is reported as a 8 /10. Location: Abdomen  / . Onset: More than a month ago. Quality: Sharp, Throbbing, Pins and needles. Timing: Constant. Modifying factor(s): medications. Vitals:  height is 6' (1.829 m) and weight is 175 lb (79.4 kg). His temporal temperature is 98.1 F (36.7 C). His blood pressure is 139/80 and his pulse is 63. His respiration is 16 and oxygen saturation is 100%.   Reason for encounter: medication management.   Patient presents today for medication management.  No significant change in his medical history since her last clinic visit.  He is working and finds that taking 1 hydrocodone a day helps his functional status and helps to  manage his pain.   Requesting a 90 day Rx given trouble with pharmacy having medications in stock. No change in dose.   Pharmacotherapy Assessment  Analgesic: Hydrocodone 5 mg daily prn    Monitoring: [place] PMP: PDMP reviewed during this encounter.       Pharmacotherapy: No side-effects or adverse reactions reported. Compliance: No problems identified. Effectiveness: Clinically acceptable.  Landis Martins, RN  04/22/2022 10:52 AM  Sign when Signing Visit Nursing Pain Medication Assessment:  Safety precautions to be maintained throughout the outpatient stay will include: orient to surroundings, keep bed in low position, maintain call bell within reach at all times, provide assistance with transfer out of bed and ambulation.  Medication Inspection Compliance: Pill count conducted under aseptic conditions, in front of the patient. Neither the pills nor the bottle was removed from the patient's sight at any time. Once count was completed pills were immediately returned to the patient in their original bottle.  Medication: Hydrocodone/APAP Pill/Patch Count:  4 of 30 pills remain Pill/Patch Appearance: Markings consistent with prescribed medication Bottle Appearance: Standard pharmacy container. Clearly labeled. Filled Date: 01 / 12 / 2024 Last Medication intake:  Yesterday Safety precautions to be maintained throughout the outpatient stay will include: orient to surroundings, keep bed in low position, maintain call bell within reach at all times, provide assistance with transfer out of bed and ambulation.     UDS:  Summary  Date Value Ref Range Status  08/06/2021 Note  Final    Comment:    ==================================================================== [facility] (MW) ==================================================================== Test  Result       Flag       Units  Drug Present and Declared for Prescription Verification   Hydrocodone                     943          EXPECTED   ng/mg creat   Norhydrocodone                 1343         EXPECTED   ng/mg creat    Sources of hydrocodone include scheduled prescription medications.    Norhydrocodone is an expected metabolite of hydrocodone.  ==================================================================== Test                      Result    Flag   Units      Ref Range   Creatinine              40               mg/dL      >=20 ==================================================================== Declared Medications:  The flagging and interpretation on this report are based on the  following declared medications.  Unexpected results may arise from  inaccuracies in the declared medications.   **Note: The testing scope of this panel includes these medications:   Hydrocodone (Norco)   **Note: The testing scope of this panel does not include the  following reported medications:   Acetaminophen (Norco)  Dicyclomine (Bentyl)  Multivitamin  Omeprazole (Prilosec) ==================================================================== For clinical consultation, please call [phone]. ====================================================================      ROS  Constitutional: Denies any fever or chills Gastrointestinal:  Chronic abdominal pain Musculoskeletal:  Abdominal pain Neurological: No reported episodes of acute onset apraxia, aphasia, dysarthria, agnosia, amnesia, paralysis, loss of coordination, or loss of consciousness  Medication Review  HYDROcodone-acetaminophen, dicyclomine, and omeprazole  History Review  Allergy: [name] is allergic to prednisone. Drug: [name]  reports no history of drug use. Alcohol:  reports no history of alcohol use. Tobacco:  reports that he quit smoking about 7 years ago. His smoking use included cigarettes. He smoked an average of 2 packs per day. He has never used smokeless tobacco. Social: [name]  reports that he quit  smoking about 7 years ago. His smoking use included cigarettes. He smoked an average of 2 packs per day. He has never used smokeless tobacco. He reports that he does not drink alcohol and does not use drugs. Medical:  has a past medical history of Abdominal pain (2011), Collagen vascular disease ([place]), Diverticulitis, History of hiatal hernia, and Small bowel obstruction ([place]). Surgical: Mr. Colpitts  has a past surgical history that includes Colon resection; Cholecystectomy; arm surgery; loop ileostomy and repair of anastomotic leak at sigmoid colon (2011); ileostomy takedown (2011); Hemorrhoid surgery (N/A, 01/16/2019); Colonoscopy with propofol (N/A, 03/05/2019); Esophagogastroduodenoscopy (egd) with propofol (N/A, 03/05/2019); and Colon surgery (2011). Family: family history includes Heart disease in his father and mother.  Laboratory Chemistry Profile   Renal Lab Results  Component Value Date   BUN 18 02/12/2022   CREATININE 1.00 02/12/2022   BCR 10 12/20/2019   GFRAA 79 12/20/2019   GFRNONAA >60 02/12/2022    Hepatic Lab Results  Component Value Date   AST 24 02/12/2022   ALT 18 02/12/2022   ALBUMIN 4.3 02/12/2022   ALKPHOS 46 02/12/2022   AMYLASE 49 09/02/2007   LIPASE 29 02/12/2022    Electrolytes  Lab Results  Component Value Date   NA 140 02/12/2022   K 3.6 02/12/2022   CL 105 02/12/2022   CALCIUM 9.5 02/12/2022   MG 2.0 12/02/2014   PHOS 2.4 (L) 12/02/2014    Bone Lab Results  Component Value Date   VD25OH 24.9 (L) 12/20/2019    Inflammation (CRP: Acute Phase) (ESR: Chronic Phase) Lab Results  Component Value Date   CRP <1 02/28/2019   LATICACIDVEN 1.8 03/14/2020         Note: Above Lab results reviewed.  Recent Imaging Review  CT ABDOMEN PELVIS W CONTRAST CLINICAL DATA:  Generalized abdominal pain which is acute since last night. History of small-bowel obstructions.  EXAM: CT ABDOMEN AND PELVIS WITH CONTRAST  TECHNIQUE: Multidetector CT imaging of  the abdomen and pelvis was performed using the standard protocol following bolus administration of intravenous contrast.  RADIATION DOSE REDUCTION: This exam was performed according to the departmental dose-optimization program which includes automated exposure control, adjustment of the mA and/or kV according to patient size and/or use of iterative reconstruction technique.  CONTRAST:  OMNIPAQUE IOHEXOL 300 MG/ML  SOLN  COMPARISON:  03/14/2020  FINDINGS: Lower chest: Lung bases are normal. Possible small sliding hiatal hernia.  Hepatobiliary: Previous cholecystectomy. Liver and biliary tree are normal.  Pancreas: Normal.  Spleen: Normal.  Adrenals/Urinary Tract: Adrenal glands are normal. Kidneys are normal size without focal mass, hydronephrosis or nephrolithiasis. Ureters and bladder are normal.  Stomach/Bowel: Possible small sliding hiatal hernia as the stomach is otherwise unremarkable.  Small bowel demonstrates a surgical suture line over the ileum in the right lower quadrant. There is a second suture line over a loop of small bowel also in the right lower quadrant more towards the midline as this loop of bowel is chronically dilated and measures 4 cm in diameter. There is an abrupt transition of this chronic dilated postsurgical loop to a normal caliber segment of ileum in the right lower quadrant. There is interval worsening of fluid-filled dilated small bowel loops in the pelvis measuring up to 3.4 cm in diameter. There is an abrupt transition of normal caliber jejunal loops to this more distally dilated segment in the left mid to lower abdomen. There is wall thickening of the small bowel segment just proximal to these dilated small bowel loops and abrupt transition. Findings may be due to an underlying enteritis. Colon is unremarkable. Appendix is normal.  Vascular/Lymphatic: Minimal calcified plaque over the abdominal aorta which is normal in caliber.  No significant adenopathy.  Reproductive: Normal.  Other: Tiny amount of free fluid in the pelvis. No free peritoneal air.  Musculoskeletal: No focal abnormality.  IMPRESSION: 1. Evidence of previous small-bowel surgery in the right lower quadrant. There is interval worsening of fluid-filled dilated small bowel loops in the pelvis measuring up to 3.4 cm in diameter. There is wall thickening of a short segment of small bowel in the left mid to lower abdomen just proximal to these dilated small bowel loops. Findings may be due to early/partial small bowel obstruction and may be related to an underlying enteritis. Tiny amount of free fluid in the pelvis. 2. Possible small sliding hiatal hernia. 3. Aortic atherosclerosis.  Aortic Atherosclerosis (ICD10-I70.0).  Electronically Signed   By: Elberta Fortis M.D.   On: 02/12/2022 08:31 Note: Reviewed        Physical Exam  General appearance: Well nourished, well developed, and well hydrated. In no apparent acute distress Mental status: Alert, oriented x 3 (person, place, &  time)       Respiratory: No evidence of acute respiratory distress Eyes: PERLA Vitals: BP 139/80   Pulse 63   Temp 98.1 F (36.7 C) (Temporal)   Resp 16   Ht 6' (1.829 m)   Wt 175 lb (79.4 kg)   SpO2 100%   BMI 23.73 kg/m  BMI: Estimated body mass index is 23.73 kg/m as calculated from the following:   Height as of this encounter: 6' (1.829 m).   Weight as of this encounter: 175 lb (79.4 kg). Ideal: Ideal body weight: 77.6 kg (171 lb 1.2 oz) Adjusted ideal body weight: 78.3 kg (172 lb 10.3 oz)  Assessment   Diagnosis Status  1. Pain management contract signed   2. Diverticular disease of colon   3. Generalized abdominal pain   4. History of abdominal surgery   5. Chronic pain syndrome     Controlled Controlled Controlled    Plan of Care   Mr. Ares Cardozo Fundora has a current medication list which includes the following long-term medication(s):  dicyclomine and omeprazole.  Pharmacotherapy (Medications Ordered): Meds ordered this encounter  Medications   HYDROcodone-acetaminophen (NORCO/VICODIN) 5-325 MG tablet    Sig: Take 1 tablet by mouth every 8 (eight) hours as needed for moderate pain.    Dispense:  90 tablet    Refill:  0   Orders:  No orders of the defined types were placed in this encounter.  Follow-up plan:   Return in about 3 months (around 08/02/2022) for Medication Management, in person.    Recent Visits Date Type Provider Dept  01/28/22 Office Visit Gillis Santa, MD [facility]  Showing recent visits within past 90 days and meeting all other requirements Today's Visits Date Type Provider Dept  04/22/22 Office Visit Gillis Santa, MD [facility]  Showing today's visits and meeting all other requirements Future Appointments No visits were found meeting these conditions. Showing future appointments within next 90 days and meeting all other requirements  I discussed the assessment and treatment plan with the patient. The patient was provided an opportunity to ask questions and all were answered. The patient agreed with the plan and demonstrated an understanding of the instructions.  Patient advised to call back or seek an in-person evaluation if the symptoms or condition worsens.  Duration of encounter: 7minutes.  Note by: Gillis Santa, MD Date: 04/22/2022; Time: 11:02 AM

## 2022-04-22 NOTE — Progress Notes (Signed)
Nursing Pain Medication Assessment:  Safety precautions to be maintained throughout the outpatient stay will include: orient to surroundings, keep bed in low position, maintain call bell within reach at all times, provide assistance with transfer out of bed and ambulation.  Medication Inspection Compliance: Pill count conducted under aseptic conditions, in front of the patient. Neither the pills nor the bottle was removed from the patient's sight at any time. Once count was completed pills were immediately returned to the patient in their original bottle.  Medication: Hydrocodone/APAP Pill/Patch Count:  4 of 30 pills remain Pill/Patch Appearance: Markings consistent with prescribed medication Bottle Appearance: Standard pharmacy container. Clearly labeled. Filled Date: 01 / 12 / 2024 Last Medication intake:  Yesterday Safety precautions to be maintained throughout the outpatient stay will include: orient to surroundings, keep bed in low position, maintain call bell within reach at all times, provide assistance with transfer out of bed and ambulation.

## 2022-07-27 ENCOUNTER — Ambulatory Visit
Payer: No Typology Code available for payment source | Attending: Student in an Organized Health Care Education/Training Program | Admitting: Student in an Organized Health Care Education/Training Program

## 2022-07-27 ENCOUNTER — Encounter: Payer: Self-pay | Admitting: Student in an Organized Health Care Education/Training Program

## 2022-07-27 VITALS — BP 129/94 | HR 61 | Temp 97.5°F | Resp 14 | Ht 72.0 in | Wt 182.0 lb

## 2022-07-27 DIAGNOSIS — K5730 Diverticulosis of large intestine without perforation or abscess without bleeding: Secondary | ICD-10-CM | POA: Diagnosis not present

## 2022-07-27 DIAGNOSIS — R1084 Generalized abdominal pain: Secondary | ICD-10-CM | POA: Insufficient documentation

## 2022-07-27 DIAGNOSIS — Z0289 Encounter for other administrative examinations: Secondary | ICD-10-CM | POA: Insufficient documentation

## 2022-07-27 DIAGNOSIS — Z9889 Other specified postprocedural states: Secondary | ICD-10-CM | POA: Diagnosis not present

## 2022-07-27 DIAGNOSIS — G894 Chronic pain syndrome: Secondary | ICD-10-CM | POA: Insufficient documentation

## 2022-07-27 MED ORDER — HYDROCODONE-ACETAMINOPHEN 5-325 MG PO TABS: 1.0000 | ORAL_TABLET | Freq: Three times a day (TID) | ORAL | 0 refills | Status: AC | PRN

## 2022-07-27 NOTE — Progress Notes (Signed)
PROVIDER NOTE: Information contained herein reflects review and annotations entered in association with encounter. Interpretation of such information and data should be left to medically-trained personnel. Information provided to patient can be located elsewhere in the medical record under "Patient Instructions". Document created using STT-dictation technology, any transcriptional errors that may result from process are unintentional.    Patient: [name]  Service Category: E/M  Provider: Edward Jolly, MD  DOB: 16-Oct-1970  DOS: 07/27/2022  Specialty: Interventional Pain Management  MRN: [id number]  Setting: Ambulatory outpatient  PCP: Patient, No Pcp Per  Type: Established Patient    Referring Provider: No ref. provider found  Location: Office  Delivery: Face-to-face     HPI  [name], a 52 y.o. year old male, is here today because of his Pain management contract signed [Z02.89]. [name] primary complain today is Abdominal Pain Last encounter: My last encounter with him was on 01/28/22 Pertinent problems: [name] has History of abdominal surgery; Generalized abdominal pain; Chronic pain syndrome; Diverticular disease of colon; and Pain management contract signed on their pertinent problem list. Pain Assessment: Severity of Chronic pain is reported as a 8 /10. Location: Abdomen  / . Onset: More than a month ago. Quality: Throbbing, Sharp. Timing: Constant. Modifying factor(s): Hydrocodone. Vitals:  height is 6' (1.829 m) and weight is 182 lb (82.6 kg). His temporal temperature is 97.5 F (36.4 C) (abnormal). His blood pressure is 129/94 (abnormal) and his pulse is 61. His respiration is 14 and oxygen saturation is 100%.   Reason for encounter: medication management.   Patient presents today for medication management.  No significant change in his medical history since her last clinic visit.  He is working and finds that taking 1 hydrocodone a day helps his functional status and helps  to manage his pain.   Requesting a 90 day Rx given trouble with pharmacy having medications in stock. No change in dose.   Pharmacotherapy Assessment  Analgesic: Hydrocodone 5 mg daily prn    Monitoring: [place] PMP: PDMP not reviewed this encounter.       Pharmacotherapy: No side-effects or adverse reactions reported. Compliance: No problems identified. Effectiveness: Clinically acceptable.  George Elk, RN  07/27/2022  1:11 PM  Sign when Signing Visit Nursing Pain Medication Assessment:  Safety precautions to be maintained throughout the outpatient stay will include: orient to surroundings, keep bed in low position, maintain call bell within reach at all times, provide assistance with transfer out of bed and ambulation.  Medication Inspection Compliance: Pill count conducted under aseptic conditions, in front of the patient. Neither the pills nor the bottle was removed from the patient's sight at any time. Once count was completed pills were immediately returned to the patient in their original bottle.  Medication: Hydrocodone/APAP Pill/Patch Count:  1 of 90 pills remain Pill/Patch Appearance: Markings consistent with prescribed medication Bottle Appearance: Standard pharmacy container. Clearly labeled. Filled Date: 02 / 10 / 2024 Last Medication intake:  Today Safety precautions to be maintained throughout the outpatient stay will include: orient to surroundings, keep bed in low position, maintain call bell within reach at all times, provide assistance with transfer out of bed and ambulation.     UDS:  Summary  Date Value Ref Range Status  08/06/2021 Note  Final    Comment:    ==================================================================== [employer] Select 13 (MW) ==================================================================== Test  Result       Flag       Units  Drug Present and Declared for Prescription Verification   Hydrocodone                     943          EXPECTED   ng/mg creat   Norhydrocodone                 1343         EXPECTED   ng/mg creat    Sources of hydrocodone include scheduled prescription medications.    Norhydrocodone is an expected metabolite of hydrocodone.  ==================================================================== Test                      Result    Flag   Units      Ref Range   Creatinine              40               mg/dL      >=20 ==================================================================== Declared Medications:  The flagging and interpretation on this report are based on the  following declared medications.  Unexpected results may arise from  inaccuracies in the declared medications.   **Note: The testing scope of this panel includes these medications:   Hydrocodone (Norco)   **Note: The testing scope of this panel does not include the  following reported medications:   Acetaminophen (Norco)  Dicyclomine (Bentyl)  Multivitamin  Omeprazole (Prilosec) ==================================================================== For clinical consultation, please call [phone]. ====================================================================      ROS  Constitutional: Denies any fever or chills Gastrointestinal:  Chronic abdominal pain Musculoskeletal:  Abdominal pain Neurological: No reported episodes of acute onset apraxia, aphasia, dysarthria, agnosia, amnesia, paralysis, loss of coordination, or loss of consciousness  Medication Review  HYDROcodone-acetaminophen, dicyclomine, and omeprazole  History Review  Allergy: [name] is allergic to prednisone. Drug: [name]  reports no history of drug use. Alcohol:  reports no history of alcohol use. Tobacco:  reports that he quit smoking about 7 years ago. His smoking use included cigarettes. He smoked an average of 2 packs per day. He has never used smokeless tobacco. Social: [name]  reports that he quit  smoking about 7 years ago. His smoking use included cigarettes. He smoked an average of 2 packs per day. He has never used smokeless tobacco. He reports that he does not drink alcohol and does not use drugs. Medical:  has a past medical history of Abdominal pain (2011), Collagen vascular disease ([place]), Diverticulitis, History of hiatal hernia, and Small bowel obstruction ([place]). Surgical: Mr. Colpitts  has a past surgical history that includes Colon resection; Cholecystectomy; arm surgery; loop ileostomy and repair of anastomotic leak at sigmoid colon (2011); ileostomy takedown (2011); Hemorrhoid surgery (N/A, 01/16/2019); Colonoscopy with propofol (N/A, 03/05/2019); Esophagogastroduodenoscopy (egd) with propofol (N/A, 03/05/2019); and Colon surgery (2011). Family: family history includes Heart disease in his father and mother.  Laboratory Chemistry Profile   Renal Lab Results  Component Value Date   BUN 18 02/12/2022   CREATININE 1.00 02/12/2022   BCR 10 12/20/2019   GFRAA 79 12/20/2019   GFRNONAA >60 02/12/2022    Hepatic Lab Results  Component Value Date   AST 24 02/12/2022   ALT 18 02/12/2022   ALBUMIN 4.3 02/12/2022   ALKPHOS 46 02/12/2022   AMYLASE 49 09/02/2007   LIPASE 29 02/12/2022    Electrolytes  Lab Results  Component Value Date   NA 140 02/12/2022   K 3.6 02/12/2022   CL 105 02/12/2022   CALCIUM 9.5 02/12/2022   MG 2.0 12/02/2014   PHOS 2.4 (L) 12/02/2014    Bone Lab Results  Component Value Date   VD25OH 24.9 (L) 12/20/2019    Inflammation (CRP: Acute Phase) (ESR: Chronic Phase) Lab Results  Component Value Date   CRP <1 02/28/2019   LATICACIDVEN 1.8 03/14/2020         Note: Above Lab results reviewed.  Recent Imaging Review  CT ABDOMEN PELVIS W CONTRAST CLINICAL DATA:  Generalized abdominal pain which is acute since last night. History of small-bowel obstructions.  EXAM: CT ABDOMEN AND PELVIS WITH CONTRAST  TECHNIQUE: Multidetector CT imaging of  the abdomen and pelvis was performed using the standard protocol following bolus administration of intravenous contrast.  RADIATION DOSE REDUCTION: This exam was performed according to the departmental dose-optimization program which includes automated exposure control, adjustment of the mA and/or kV according to patient size and/or use of iterative reconstruction technique.  CONTRAST:  OMNIPAQUE IOHEXOL 300 MG/ML  SOLN  COMPARISON:  03/14/2020  FINDINGS: Lower chest: Lung bases are normal. Possible small sliding hiatal hernia.  Hepatobiliary: Previous cholecystectomy. Liver and biliary tree are normal.  Pancreas: Normal.  Spleen: Normal.  Adrenals/Urinary Tract: Adrenal glands are normal. Kidneys are normal size without focal mass, hydronephrosis or nephrolithiasis. Ureters and bladder are normal.  Stomach/Bowel: Possible small sliding hiatal hernia as the stomach is otherwise unremarkable.  Small bowel demonstrates a surgical suture line over the ileum in the right lower quadrant. There is a second suture line over a loop of small bowel also in the right lower quadrant more towards the midline as this loop of bowel is chronically dilated and measures 4 cm in diameter. There is an abrupt transition of this chronic dilated postsurgical loop to a normal caliber segment of ileum in the right lower quadrant. There is interval worsening of fluid-filled dilated small bowel loops in the pelvis measuring up to 3.4 cm in diameter. There is an abrupt transition of normal caliber jejunal loops to this more distally dilated segment in the left mid to lower abdomen. There is wall thickening of the small bowel segment just proximal to these dilated small bowel loops and abrupt transition. Findings may be due to an underlying enteritis. Colon is unremarkable. Appendix is normal.  Vascular/Lymphatic: Minimal calcified plaque over the abdominal aorta which is normal in caliber.  No significant adenopathy.  Reproductive: Normal.  Other: Tiny amount of free fluid in the pelvis. No free peritoneal air.  Musculoskeletal: No focal abnormality.  IMPRESSION: 1. Evidence of previous small-bowel surgery in the right lower quadrant. There is interval worsening of fluid-filled dilated small bowel loops in the pelvis measuring up to 3.4 cm in diameter. There is wall thickening of a short segment of small bowel in the left mid to lower abdomen just proximal to these dilated small bowel loops. Findings may be due to early/partial small bowel obstruction and may be related to an underlying enteritis. Tiny amount of free fluid in the pelvis. 2. Possible small sliding hiatal hernia. 3. Aortic atherosclerosis.  Aortic Atherosclerosis (ICD10-I70.0).  Electronically Signed   By: Elberta Fortis M.D.   On: 02/12/2022 08:31 Note: Reviewed        Physical Exam  General appearance: Well nourished, well developed, and well hydrated. In no apparent acute distress Mental status: Alert, oriented x 3 (person, place, &  time)       Respiratory: No evidence of acute respiratory distress Eyes: PERLA Vitals: BP (!) 129/94   Pulse 61   Temp (!) 97.5 F (36.4 C) (Temporal)   Resp 14   Ht 6' (1.829 m)   Wt 182 lb (82.6 kg)   SpO2 100%   BMI 24.68 kg/m  BMI: Estimated body mass index is 24.68 kg/m as calculated from the following:   Height as of this encounter: 6' (1.829 m).   Weight as of this encounter: 182 lb (82.6 kg). Ideal: Ideal body weight: 77.6 kg (171 lb 1.2 oz) Adjusted ideal body weight: 79.6 kg (175 lb 7.1 oz)  Assessment   Diagnosis Status  1. Pain management contract signed   2. Diverticular disease of colon   3. Generalized abdominal pain   4. History of abdominal surgery   5. Chronic pain syndrome      Controlled Controlled Controlled    Plan of Care   Mr. [place] Ottmar Koen has a current medication list which includes the following long-term  medication(s): dicyclomine and omeprazole.  Pharmacotherapy (Medications Ordered): Meds ordered this encounter  Medications   HYDROcodone-acetaminophen (NORCO/VICODIN) 5-325 MG tablet    Sig: Take 1 tablet by mouth every 8 (eight) hours as needed for severe pain.    Dispense:  90 tablet    Refill:  0    Chronic Pain: STOP Act (Not applicable) Fill 1 day early if closed on refill date. Avoid benzodiazepines within 8 hours of opioids   Orders Placed This Encounter  Procedures   Tox[employer] Select 13 (MW), Urine    Volume: 30 ml(s). Minimum 3 ml of urine is needed. Document temperature of fresh sample. Indications: Long term (current) use of opiate analgesic [phone])    Order Specific Question:   Release to patient    Answer:   Immediate     Follow-up plan:   Return in about 3 months (around 10/27/2022) for Medication Management, in person.    Recent Visits No visits were found meeting these conditions. Showing recent visits within past 90 days and meeting all other requirements Today's Visits Date Type Provider Dept  07/27/22 Office Visit Edward Jolly, MD [facility]  Showing today's visits and meeting all other requirements Future Appointments No visits were found meeting these conditions. Showing future appointments within next 90 days and meeting all other requirements  I discussed the assessment and treatment plan with the patient. The patient was provided an opportunity to ask questions and all were answered. The patient agreed with the plan and demonstrated an understanding of the instructions.  Patient advised to call back or seek an in-person evaluation if the symptoms or condition worsens.  Duration of encounter: .  Note by: Edward Jolly, MD Date: 07/27/2022; Time: 1:24 PM

## 2022-07-27 NOTE — Progress Notes (Signed)
Nursing Pain Medication Assessment:  Safety precautions to be maintained throughout the outpatient stay will include: orient to surroundings, keep bed in low position, maintain call bell within reach at all times, provide assistance with transfer out of bed and ambulation.  Medication Inspection Compliance: Pill count conducted under aseptic conditions, in front of the patient. Neither the pills nor the bottle was removed from the patient's sight at any time. Once count was completed pills were immediately returned to the patient in their original bottle.  Medication: Hydrocodone/APAP Pill/Patch Count:  1 of 90 pills remain Pill/Patch Appearance: Markings consistent with prescribed medication Bottle Appearance: Standard pharmacy container. Clearly labeled. Filled Date: 02 / 10 / 2024 Last Medication intake:  Today Safety precautions to be maintained throughout the outpatient stay will include: orient to surroundings, keep bed in low position, maintain call bell within reach at all times, provide assistance with transfer out of bed and ambulation.

## 2022-07-29 ENCOUNTER — Encounter
Payer: No Typology Code available for payment source | Admitting: Student in an Organized Health Care Education/Training Program

## 2022-07-30 LAB — TOXASSURE SELECT 13 (MW), URINE

## 2022-10-26 ENCOUNTER — Ambulatory Visit
Payer: No Typology Code available for payment source | Attending: Student in an Organized Health Care Education/Training Program | Admitting: Student in an Organized Health Care Education/Training Program

## 2022-10-26 ENCOUNTER — Encounter: Payer: Self-pay | Admitting: Student in an Organized Health Care Education/Training Program

## 2022-10-26 VITALS — BP 139/78 | HR 68 | Temp 97.8°F | Resp 16 | Ht 72.0 in | Wt 179.0 lb

## 2022-10-26 DIAGNOSIS — G894 Chronic pain syndrome: Secondary | ICD-10-CM | POA: Insufficient documentation

## 2022-10-26 DIAGNOSIS — R1084 Generalized abdominal pain: Secondary | ICD-10-CM | POA: Insufficient documentation

## 2022-10-26 DIAGNOSIS — K5730 Diverticulosis of large intestine without perforation or abscess without bleeding: Secondary | ICD-10-CM | POA: Diagnosis not present

## 2022-10-26 DIAGNOSIS — Z9889 Other specified postprocedural states: Secondary | ICD-10-CM | POA: Insufficient documentation

## 2022-10-26 DIAGNOSIS — Z0289 Encounter for other administrative examinations: Secondary | ICD-10-CM | POA: Diagnosis present

## 2022-10-26 MED ORDER — HYDROCODONE-ACETAMINOPHEN 5-325 MG PO TABS: 1.0000 | ORAL_TABLET | Freq: Three times a day (TID) | ORAL | 0 refills | Status: DC | PRN

## 2022-10-26 NOTE — Progress Notes (Signed)
Nursing Pain Medication Assessment:  Safety precautions to be maintained throughout the outpatient stay will include: orient to surroundings, keep bed in low position, maintain call bell within reach at all times, provide assistance with transfer out of bed and ambulation.  Medication Inspection Compliance: Pill count conducted under aseptic conditions, in front of the patient. Neither the pills nor the bottle was removed from the patient's sight at any time. Once count was completed pills were immediately returned to the patient in their original bottle.  Medication: Hydrocodone/APAP Pill/Patch Count:  0 of 90 pills remain Pill/Patch Appearance: Markings consistent with prescribed medication Bottle Appearance: Standard pharmacy container. Clearly labeled. Filled Date: 5 / 7 / 2024 Last Medication intake:  Today

## 2022-10-26 NOTE — Progress Notes (Signed)
PROVIDER NOTE: Information contained herein reflects review and annotations entered in association with encounter. Interpretation of such information and data should be left to medically-trained personnel. Information provided to patient can be located elsewhere in the medical record under "Patient Instructions". Document created using STT-dictation technology, any transcriptional errors that may result from process are unintentional.    Patient: [name]  Service Category: E/M  Provider: Edward Jolly, MD  DOB: March 15, 1971  DOS: 10/26/2022  Specialty: Interventional Pain Management  MRN: [id number]  Setting: Ambulatory outpatient  PCP: Patient, No Pcp Per  Type: Established Patient    Referring Provider: No ref. provider found  Location: Office  Delivery: Face-to-face     HPI  [name], a 52 y.o. year old male, is here today because of his Pain management contract signed [Z02.89]. [name] primary complain today is Abdominal Pain (Incision sites on abdomen) Last encounter: My last encounter with him was on 07/27/22 Pertinent problems: [name] has History of abdominal surgery; Generalized abdominal pain; Chronic pain syndrome; Diverticular disease of colon; and Pain management contract signed on their pertinent problem list. Pain Assessment: Severity of Chronic pain is reported as a 5 /10. Location: Abdomen Medial/denies. Onset: More than a month ago. Quality: Stabbing, Sharp, Throbbing, Aching, Burning. Timing: Constant. Modifying factor(s): meds. Vitals:  height is 6' (1.829 m) and weight is 179 lb (81.2 kg). His temperature is 97.8 F (36.6 C). His blood pressure is 139/78 and his pulse is 68. His respiration is 16 and oxygen saturation is 99%.   Reason for encounter: medication management.   Patient presents today for medication management.  No significant change in his medical history since her last clinic visit.  He is working and finds that taking 1 hydrocodone a day helps his  functional status and helps to manage his pain.   Patient's previous urine toxicology screen was inappropriately positive for codeine.  Patient states that he had a codeine prescription available from his previous surgery and took 2 tablets. I informed the patient that this constitutes a violation of his pain contract however I will get a repeat urine toxicology screening and any subsequent urine toxicology screens should be negative for anything that he is not prescribed..  Patient endorsed understanding.   Pharmacotherapy Assessment  Analgesic: Hydrocodone 5 mg daily prn    Monitoring: [place] PMP: PDMP reviewed during this encounter.       Pharmacotherapy: No side-effects or adverse reactions reported. Compliance: No problems identified. Effectiveness: Clinically acceptable.  George Mattes, RN  10/26/2022  1:00 PM  Sign when Signing Visit Nursing Pain Medication Assessment:  Safety precautions to be maintained throughout the outpatient stay will include: orient to surroundings, keep bed in low position, maintain call bell within reach at all times, provide assistance with transfer out of bed and ambulation.  Medication Inspection Compliance: Pill count conducted under aseptic conditions, in front of the patient. Neither the pills nor the bottle was removed from the patient's sight at any time. Once count was completed pills were immediately returned to the patient in their original bottle.  Medication: Hydrocodone/APAP Pill/Patch Count:  0 of 90 pills remain Pill/Patch Appearance: Markings consistent with prescribed medication Bottle Appearance: Standard pharmacy container. Clearly labeled. Filled Date: 5 / 7 / 2024 Last Medication intake:  Today    UDS:  Summary  Date Value Ref Range Status  07/27/2022 Note  Final    Comment:    ==================================================================== Tox[employer] Select 13  (MW) ==================================================================== Test  Result       Flag       Units  Drug Present and Declared for Prescription Verification   Hydrocodone                    303          EXPECTED   ng/mg creat   Norhydrocodone                 404          EXPECTED   ng/mg creat    Sources of hydrocodone include scheduled prescription medications.    Norhydrocodone is an expected metabolite of hydrocodone.  Drug Present not Declared for Prescription Verification   Codeine                        102          UNEXPECTED ng/mg creat   Norcodeine                     54           UNEXPECTED ng/mg creat    Sources of codeine include scheduled prescription medications.     Norcodeine is an expected metabolite of codeine.  ==================================================================== Test                      Result    Flag   Units      Ref Range   Creatinine              191              mg/dL      >=40 ==================================================================== Declared Medications:  The flagging and interpretation on this report are based on the  following declared medications.  Unexpected results may arise from  inaccuracies in the declared medications.   **Note: The testing scope of this panel includes these medications:   Hydrocodone (Norco)   **Note: The testing scope of this panel does not include the  following reported medications:   Acetaminophen (Norco)  Dicyclomine (Bentyl)  Omeprazole (Prilosec) ==================================================================== For clinical consultation, please call [phone]. ====================================================================      ROS  Constitutional: Denies any fever or chills Gastrointestinal:  Chronic abdominal pain Musculoskeletal:  Abdominal pain Neurological: No reported episodes of acute onset apraxia, aphasia, dysarthria, agnosia,  amnesia, paralysis, loss of coordination, or loss of consciousness  Medication Review  HYDROcodone-acetaminophen, dicyclomine, and omeprazole  History Review  Allergy: [name] is allergic to prednisone. Drug: [name]  reports no history of drug use. Alcohol:  reports no history of alcohol use. Tobacco:  reports that he quit smoking about 7 years ago. His smoking use included cigarettes. He has never used smokeless tobacco. Social: Mr. Gialanella  reports that he quit smoking about 7 years ago. His smoking use included cigarettes. He has never used smokeless tobacco. He reports that he does not drink alcohol and does not use drugs. Medical:  has a past medical history of Abdominal pain (2011), Collagen vascular disease (HCC), Diverticulitis, History of hiatal hernia, and Small bowel obstruction (HCC). Surgical: Mr. Crumity  has a past surgical history that includes Colon resection; Cholecystectomy; arm surgery; loop ileostomy and repair of anastomotic leak at sigmoid colon (2011); ileostomy takedown (2011); Hemorrhoid surgery (N/A, 01/16/2019); Colonoscopy with propofol (N/A, 03/05/2019); Esophagogastroduodenoscopy (egd) with propofol (N/A, 03/05/2019); and Colon surgery (2011). Family: family history includes Heart disease in his father  and mother.  Laboratory Chemistry Profile   Renal Lab Results  Component Value Date   BUN 18 02/12/2022   CREATININE 1.00 02/12/2022   BCR 10 12/20/2019   GFRAA 79 12/20/2019   GFRNONAA >60 02/12/2022    Hepatic Lab Results  Component Value Date   AST 24 02/12/2022   ALT 18 02/12/2022   ALBUMIN 4.3 02/12/2022   ALKPHOS 46 02/12/2022   AMYLASE 49 09/02/2007   LIPASE 29 02/12/2022    Electrolytes Lab Results  Component Value Date   NA 140 02/12/2022   K 3.6 02/12/2022   CL 105 02/12/2022   CALCIUM 9.5 02/12/2022   MG 2.0 12/02/2014   PHOS 2.4 (L) 12/02/2014    Bone Lab Results  Component Value Date   VD25OH 24.9 (L) 12/20/2019     Inflammation (CRP: Acute Phase) (ESR: Chronic Phase) Lab Results  Component Value Date   CRP <1 02/28/2019   LATICACIDVEN 1.8 03/14/2020         Note: Above Lab results reviewed.  Recent Imaging Review  CT ABDOMEN PELVIS W CONTRAST CLINICAL DATA:  Generalized abdominal pain which is acute since last night. History of small-bowel obstructions.  EXAM: CT ABDOMEN AND PELVIS WITH CONTRAST  TECHNIQUE: Multidetector CT imaging of the abdomen and pelvis was performed using the standard protocol following bolus administration of intravenous contrast.  RADIATION DOSE REDUCTION: This exam was performed according to the departmental dose-optimization program which includes automated exposure control, adjustment of the mA and/or kV according to patient size and/or use of iterative reconstruction technique.  CONTRAST:  OMNIPAQUE IOHEXOL 300 MG/ML  SOLN  COMPARISON:  03/14/2020  FINDINGS: Lower chest: Lung bases are normal. Possible small sliding hiatal hernia.  Hepatobiliary: Previous cholecystectomy. Liver and biliary tree are normal.  Pancreas: Normal.  Spleen: Normal.  Adrenals/Urinary Tract: Adrenal glands are normal. Kidneys are normal size without focal mass, hydronephrosis or nephrolithiasis. Ureters and bladder are normal.  Stomach/Bowel: Possible small sliding hiatal hernia as the stomach is otherwise unremarkable.  Small bowel demonstrates a surgical suture line over the ileum in the right lower quadrant. There is a second suture line over a loop of small bowel also in the right lower quadrant more towards the midline as this loop of bowel is chronically dilated and measures 4 cm in diameter. There is an abrupt transition of this chronic dilated postsurgical loop to a normal caliber segment of ileum in the right lower quadrant. There is interval worsening of fluid-filled dilated small bowel loops in the pelvis measuring up to 3.4 cm in diameter. There is  an abrupt transition of normal caliber jejunal loops to this more distally dilated segment in the left mid to lower abdomen. There is wall thickening of the small bowel segment just proximal to these dilated small bowel loops and abrupt transition. Findings may be due to an underlying enteritis. Colon is unremarkable. Appendix is normal.  Vascular/Lymphatic: Minimal calcified plaque over the abdominal aorta which is normal in caliber. No significant adenopathy.  Reproductive: Normal.  Other: Tiny amount of free fluid in the pelvis. No free peritoneal air.  Musculoskeletal: No focal abnormality.  IMPRESSION: 1. Evidence of previous small-bowel surgery in the right lower quadrant. There is interval worsening of fluid-filled dilated small bowel loops in the pelvis measuring up to 3.4 cm in diameter. There is wall thickening of a short segment of small bowel in the left mid to lower abdomen just proximal to these dilated small bowel loops. Findings may be  due to early/partial small bowel obstruction and may be related to an underlying enteritis. Tiny amount of free fluid in the pelvis. 2. Possible small sliding hiatal hernia. 3. Aortic atherosclerosis.  Aortic Atherosclerosis (ICD10-I70.0).  Electronically Signed   By: Elberta Fortis M.D.   On: 02/12/2022 08:31 Note: Reviewed        Physical Exam  General appearance: Well nourished, well developed, and well hydrated. In no apparent acute distress Mental status: Alert, oriented x 3 (person, place, & time)       Respiratory: No evidence of acute respiratory distress Eyes: PERLA Vitals: BP 139/78   Pulse 68   Temp 97.8 F (36.6 C)   Resp 16   Ht 6' (1.829 m)   Wt 179 lb (81.2 kg)   SpO2 99%   BMI 24.28 kg/m  BMI: Estimated body mass index is 24.28 kg/m as calculated from the following:   Height as of this encounter: 6' (1.829 m).   Weight as of this encounter: 179 lb (81.2 kg). Ideal: Ideal body weight: 77.6 kg (171 lb  1.2 oz) Adjusted ideal body weight: 79 kg (174 lb 3.9 oz)  Assessment   Diagnosis Status  1. Pain management contract signed   2. Diverticular disease of colon   3. Generalized abdominal pain   4. History of abdominal surgery   5. Chronic pain syndrome       Controlled Controlled Controlled    Plan of Care   Mr. Gehrig Schiess Verzosa has a current medication list which includes the following long-term medication(s): dicyclomine and omeprazole.  Pharmacotherapy (Medications Ordered): Meds ordered this encounter  Medications   HYDROcodone-acetaminophen (NORCO/VICODIN) 5-325 MG tablet    Sig: Take 1 tablet by mouth every 8 (eight) hours as needed for moderate pain.    Dispense:  90 tablet    Refill:  0   Orders Placed This Encounter  Procedures   ToxASSURE Select 13 (MW), Urine    Volume: 30 ml(s). Minimum 3 ml of urine is needed. Document temperature of fresh sample. Indications: Long term (current) use of opiate analgesic (G40.102)    Order Specific Question:   Release to patient    Answer:   Immediate  Patient given a one-time warning regarding compliance with chronic pain regimen.  He states that he did take codeine last Sunday and that his urine toxicology screen may be positive for that. I recommend every 3 month urine toxicology screens to ensure that the patient is compliant with his medication regimen.  He is on a low-dose of oxycodone which he takes 1 tablet daily for breakthrough pain.   Follow-up plan:   Return in about 3 months (around 01/26/2023) for Medication Management, in person.    Recent Visits No visits were found meeting these conditions. Showing recent visits within past 90 days and meeting all other requirements Today's Visits Date Type Provider Dept  10/26/22 Office Visit Edward Jolly, MD [facility]  Showing today's visits and meeting all other requirements Future Appointments Date Type Provider Dept  01/20/23 Appointment Edward Jolly,  MD [facility]  Showing future appointments within next 90 days and meeting all other requirements  I discussed the assessment and treatment plan with the patient. The patient was provided an opportunity to ask questions and all were answered. The patient agreed with the plan and demonstrated an understanding of the instructions.  Patient advised to call back or seek an in-person evaluation if the symptoms or condition worsens.  Duration of  encounter: .  Note by: Edward Jolly, MD Date: 10/26/2022; Time: 1:38 PM

## 2022-11-01 NOTE — Addendum Note (Signed)
Addended by: Edward Jolly on: 11/01/2022 08:27 AM   Modules accepted: Orders

## 2022-11-02 ENCOUNTER — Telehealth: Payer: Self-pay

## 2022-11-02 NOTE — Telephone Encounter (Signed)
[name] spoke with patient and informed him that he would not be able to talk with Dr Cherylann Ratel tomorrow. Patient states understanding.

## 2022-11-02 NOTE — Telephone Encounter (Signed)
Informed patient that he would need to come in for a UDS tomorrow.  Patient states that he would like to talk to Dr Cherylann Ratel when he gets here around 330.

## 2022-12-15 ENCOUNTER — Telehealth: Payer: Self-pay | Admitting: Student in an Organized Health Care Education/Training Program

## 2022-12-15 NOTE — Telephone Encounter (Signed)
Returned phone call to [employer].  We had a phone call that patient was going to have oral surgery on 01/06/23 and that several request had been faxed over.  I faxed over the document for post procedure pain relief.  I asked to speak with [name], unavailable, left message about fax and if this is not what she needed to please return the call.  I did let them know that I had spoken with Dr Cherylann Ratel and he wanted me to let them know that whatever they think is appropriate for post procedural pain relief is fine to prescribe.  They will give [name] message and she will return the call if needed.

## 2022-12-15 NOTE — Telephone Encounter (Signed)
It is a pre dental form and someone tried to fax it yesterday but it wont go. I tried to fax it as well.

## 2022-12-15 NOTE — Telephone Encounter (Signed)
Kelly at [facility] is looking for the form she sent to be filled out. The patient is having dental procedures on 01-06-23 and they need this asap. Multiple faxed did not go thru. Please send to   [facility]  [address] [place], [place] [address]  Attn. Tresa Endo

## 2022-12-15 NOTE — Telephone Encounter (Signed)
Message left with Dr Alveda Reasons office to get fax #.  Also need to make sure exactly what it is that she needs.  I am assuming it is a post operative pain management document, but do not have any indication for that.

## 2022-12-23 ENCOUNTER — Telehealth: Payer: Self-pay | Admitting: Student in an Organized Health Care Education/Training Program

## 2022-12-23 NOTE — Telephone Encounter (Signed)
Called [employer] and spoke with Tresa Endo, Print production planner. Per Tresa Endo she received today in the mail the medical clearance they requested. No other information is needed to be sent per [facility].

## 2022-12-23 NOTE — Telephone Encounter (Signed)
[facility] Dental call stated that they need a clearance for patient. Please give Tresa Endo a call back asap. TY

## 2023-01-11 ENCOUNTER — Encounter: Payer: Self-pay | Admitting: Student in an Organized Health Care Education/Training Program

## 2023-01-11 ENCOUNTER — Ambulatory Visit
Payer: No Typology Code available for payment source | Attending: Student in an Organized Health Care Education/Training Program | Admitting: Student in an Organized Health Care Education/Training Program

## 2023-01-11 VITALS — BP 152/91 | HR 78 | Temp 97.5°F | Ht 72.0 in | Wt 179.0 lb

## 2023-01-11 DIAGNOSIS — K5730 Diverticulosis of large intestine without perforation or abscess without bleeding: Secondary | ICD-10-CM | POA: Diagnosis present

## 2023-01-11 DIAGNOSIS — G894 Chronic pain syndrome: Secondary | ICD-10-CM | POA: Insufficient documentation

## 2023-01-11 DIAGNOSIS — Z0289 Encounter for other administrative examinations: Secondary | ICD-10-CM | POA: Insufficient documentation

## 2023-01-11 DIAGNOSIS — Z9889 Other specified postprocedural states: Secondary | ICD-10-CM | POA: Diagnosis present

## 2023-01-11 DIAGNOSIS — R1084 Generalized abdominal pain: Secondary | ICD-10-CM | POA: Diagnosis not present

## 2023-01-11 MED ORDER — HYDROCODONE-ACETAMINOPHEN 5-325 MG PO TABS: 1.0000 | ORAL_TABLET | Freq: Three times a day (TID) | ORAL | 0 refills | Status: AC | PRN

## 2023-01-11 NOTE — Progress Notes (Signed)
Nursing Pain Medication Assessment:  Safety precautions to be maintained throughout the outpatient stay will include: orient to surroundings, keep bed in low position, maintain call bell within reach at all times, provide assistance with transfer out of bed and ambulation.  Medication Inspection Compliance: Pill count conducted under aseptic conditions, in front of the patient. Neither the pills nor the bottle was removed from the patient's sight at any time. Once count was completed pills were immediately returned to the patient in their original bottle.  Medication: Hydrocodone/APAP Pill/Patch Count:  6 of 90 pills remain Pill/Patch Appearance: Markings consistent with prescribed medication Bottle Appearance: Standard pharmacy container. Clearly labeled. Filled Date: 8 / 6 / 2024 Last Medication intake:  TodaySafety precautions to be maintained throughout the outpatient stay will include: orient to surroundings, keep bed in low position, maintain call bell within reach at all times, provide assistance with transfer out of bed and ambulation.

## 2023-01-11 NOTE — Progress Notes (Signed)
PROVIDER NOTE: Information contained herein reflects review and annotations entered in association with encounter. Interpretation of such information and data should be left to medically-trained personnel. Information provided to patient can be located elsewhere in the medical record under "Patient Instructions". Document created using STT-dictation technology, any transcriptional errors that may result from process are unintentional.    Patient: [name]  Service Category: E/M  Provider: Edward Jolly, MD  DOB: March 22, 1971  DOS: 01/11/2023  Specialty: Interventional Pain Management  MRN: [id number]  Setting: Ambulatory outpatient  PCP: Patient, No Pcp Per  Type: Established Patient    Referring Provider: No ref. provider found  Location: Office  Delivery: Face-to-face     HPI  [name], a 52 y.o. year old male, is here today because of his Chronic pain syndrome [G89.4]. [name] primary complain today is Abdominal Pain Last encounter: My last encounter with him was on 10/26/22 Pertinent problems: [name] has History of abdominal surgery; Generalized abdominal pain; Chronic pain syndrome; Diverticular disease of colon; and Pain management contract signed on their pertinent problem list. Pain Assessment: Severity of Chronic pain is reported as a 3 /10. Location: Abdomen Anterior/Denies. Onset: More than a month ago. Quality: Burning, Aching, Constant, Throbbing, Tightness, Stabbing, Spasm, Shooting, Sharp, Discomfort. Timing: Constant. Modifying factor(s): Meds. Vitals:  height is 6' (1.829 m) and weight is 179 lb (81.2 kg). His temperature is 97.5 F (36.4 C) (abnormal). His blood pressure is 152/91 (abnormal) and his [name] is 78. His oxygen saturation is 99%.   Reason for encounter: medication management.   Patient presents today for medication management for his chronic abdominal pain.  History of prior abdominal surgery and diverticular disease.  Usually takes 1 hydrocodone daily.   He recently had a tooth extraction and states that he is dealing with complications from that and acute postprocedural pain.  I recommend that he follow-up with his dentist regarding this.  Their clinic has been notified of our policy as it pertains to opioid prescribing for acute postoperative/postprocedural pain which is to be managed by the surgeon and or dentist.  10/26/22 Patient presents today for medication management.  No significant change in his medical history since her last clinic visit.  He is working and finds that taking 1 hydrocodone a day helps his functional status and helps to manage his pain.   Patient's previous urine toxicology screen was inappropriately positive for codeine.  Patient states that he had a codeine prescription available from his previous surgery and took 2 tablets. I informed the patient that this constitutes a violation of his pain contract however I will get a repeat urine toxicology screening and any subsequent urine toxicology screens should be negative for anything that he is not prescribed..  Patient endorsed understanding.   Pharmacotherapy Assessment  Analgesic: Hydrocodone 5 mg daily prn    Monitoring: [place] PMP: PDMP reviewed during this encounter.       Pharmacotherapy: No side-effects or adverse reactions reported. Compliance: No problems identified. Effectiveness: Clinically acceptable.  George Pulse, [name]  01/11/2023  2:00 PM  Sign when Signing Visit Nursing Pain Medication Assessment:  Safety precautions to be maintained throughout the outpatient stay will include: orient to surroundings, keep bed in low position, maintain call bell within reach at all times, provide assistance with transfer out of bed and ambulation.  Medication Inspection Compliance: Pill count conducted under aseptic conditions, in front of the patient. Neither the pills nor the bottle was removed from the patient's  sight at any time. Once count was completed pills were  immediately returned to the patient in their original bottle.  Medication: Hydrocodone/APAP Pill/Patch Count:  6 of 90 pills remain Pill/Patch Appearance: Markings consistent with prescribed medication Bottle Appearance: Standard pharmacy container. Clearly labeled. Filled Date: 8 / 6 / 2024 Last Medication intake:  TodaySafety precautions to be maintained throughout the outpatient stay will include: orient to surroundings, keep bed in low position, maintain call bell within reach at all times, provide assistance with transfer out of bed and ambulation.     UDS:  Summary  Date Value Ref Range Status  11/03/2022 Note  Final    Comment:    ==================================================================== [employer] (MW) ==================================================================== Test                             Result       Flag       Units  Drug Present and Declared for Prescription Verification   Hydrocodone                    418          EXPECTED   ng/mg creat   Dihydrocodeine                 29           EXPECTED   ng/mg creat   Norhydrocodone                 468          EXPECTED   ng/mg creat    Sources of hydrocodone include scheduled prescription medications.    Dihydrocodeine and norhydrocodone are expected metabolites of    hydrocodone. Dihydrocodeine is also available as a scheduled    prescription medication.  ==================================================================== Test                      Result    Flag   Units      Ref Range   Creatinine              237              mg/dL      >=16 ==================================================================== Declared Medications:  The flagging and interpretation on this report are based on the  following declared medications.  Unexpected results may arise from  inaccuracies in the declared medications.   **Note: The testing scope of this panel includes these medications:   Hydrocodone  (Norco)   **Note: The testing scope of this panel does not include the  following reported medications:   Acetaminophen (Norco)  Dicyclomine (Bentyl)  Omeprazole ==================================================================== For clinical consultation, please call [phone]. ====================================================================      ROS  Constitutional: Denies any fever or chills Gastrointestinal:  Chronic abdominal pain Musculoskeletal:  Abdominal pain Neurological: No reported episodes of acute onset apraxia, aphasia, dysarthria, agnosia, amnesia, paralysis, loss of coordination, or loss of consciousness  Medication Review  HYDROcodone-acetaminophen, dicyclomine, and omeprazole  History Review  Allergy: [name] is allergic to prednisone. Drug: [name]  reports no history of drug use. Alcohol:  reports no history of alcohol use. Tobacco:  reports that he quit smoking about 7 years ago. His smoking use included cigarettes. He has never used smokeless tobacco. Social: [name]  reports that he quit smoking about 7 years ago. His smoking use included cigarettes. He has never used smokeless  tobacco. He reports that he does not drink alcohol and does not use drugs. Medical:  has a past medical history of Abdominal pain (2011), Collagen vascular disease (HCC), Diverticulitis, History of hiatal hernia, and Small bowel obstruction (HCC). Surgical: [name]  has a past surgical history that includes Colon resection; Cholecystectomy; arm surgery; loop ileostomy and repair of anastomotic leak at sigmoid colon (2011); ileostomy takedown (2011); Hemorrhoid surgery (N/A, 01/16/2019); Colonoscopy with propofol (N/A, 03/05/2019); Esophagogastroduodenoscopy (egd) with propofol (N/A, 03/05/2019); and Colon surgery (2011). Family: family history includes Heart disease in his father and mother.  Laboratory Chemistry Profile   Renal Lab Results  Component Value  Date   BUN 18 02/12/2022   CREATININE 1.00 02/12/2022   BCR 10 12/20/2019   GFRAA 79 12/20/2019   GFRNONAA >60 02/12/2022    Hepatic Lab Results  Component Value Date   AST 24 02/12/2022   ALT 18 02/12/2022   ALBUMIN 4.3 02/12/2022   ALKPHOS 46 02/12/2022   AMYLASE 49 09/02/2007   LIPASE 29 02/12/2022    Electrolytes Lab Results  Component Value Date   NA 140 02/12/2022   K 3.6 02/12/2022   CL 105 02/12/2022   CALCIUM 9.5 02/12/2022   MG 2.0 12/02/2014   PHOS 2.4 (L) 12/02/2014    Bone Lab Results  Component Value Date   VD25OH 24.9 (L) 12/20/2019    Inflammation (CRP: Acute Phase) (ESR: Chronic Phase) Lab Results  Component Value Date   CRP <1 02/28/2019   LATICACIDVEN 1.8 03/14/2020         Note: Above Lab results reviewed.  Recent Imaging Review  CT ABDOMEN PELVIS W CONTRAST CLINICAL DATA:  Generalized abdominal pain which is acute since last night. History of small-bowel obstructions.  EXAM: CT ABDOMEN AND PELVIS WITH CONTRAST  TECHNIQUE: Multidetector CT imaging of the abdomen and pelvis was performed using the standard protocol following bolus administration of intravenous contrast.  RADIATION DOSE REDUCTION: This exam was performed according to the departmental dose-optimization program which includes automated exposure control, adjustment of the mA and/or kV according to patient size and/or use of iterative reconstruction technique.  CONTRAST:  OMNIPAQUE IOHEXOL 300 MG/ML  SOLN  COMPARISON:  03/14/2020  FINDINGS: Lower chest: Lung bases are normal. Possible small sliding hiatal hernia.  Hepatobiliary: Previous cholecystectomy. Liver and biliary tree are normal.  Pancreas: Normal.  Spleen: Normal.  Adrenals/Urinary Tract: Adrenal glands are normal. Kidneys are normal size without focal mass, hydronephrosis or nephrolithiasis. Ureters and bladder are normal.  Stomach/Bowel: Possible small sliding hiatal hernia as the stomach is  otherwise unremarkable.  Small bowel demonstrates a surgical suture line over the ileum in the right lower quadrant. There is a second suture line over a loop of small bowel also in the right lower quadrant more towards the midline as this loop of bowel is chronically dilated and measures 4 cm in diameter. There is an abrupt transition of this chronic dilated postsurgical loop to a normal caliber segment of ileum in the right lower quadrant. There is interval worsening of fluid-filled dilated small bowel loops in the pelvis measuring up to 3.4 cm in diameter. There is an abrupt transition of normal caliber jejunal loops to this more distally dilated segment in the left mid to lower abdomen. There is wall thickening of the small bowel segment just proximal to these dilated small bowel loops and abrupt transition. Findings may be due to an underlying enteritis. Colon is unremarkable. Appendix is normal.  Vascular/Lymphatic: Minimal calcified  plaque over the abdominal aorta which is normal in caliber. No significant adenopathy.  Reproductive: Normal.  Other: Tiny amount of free fluid in the pelvis. No free peritoneal air.  Musculoskeletal: No focal abnormality.  IMPRESSION: 1. Evidence of previous small-bowel surgery in the right lower quadrant. There is interval worsening of fluid-filled dilated small bowel loops in the pelvis measuring up to 3.4 cm in diameter. There is wall thickening of a short segment of small bowel in the left mid to lower abdomen just proximal to these dilated small bowel loops. Findings may be due to early/partial small bowel obstruction and may be related to an underlying enteritis. Tiny amount of free fluid in the pelvis. 2. Possible small sliding hiatal hernia. 3. Aortic atherosclerosis.  Aortic Atherosclerosis (ICD10-I70.0).  Electronically Signed   By: Elberta Fortis M.D.   On: 02/12/2022 08:31 Note: Reviewed        Physical Exam  General  appearance: Well nourished, well developed, and well hydrated. In no apparent acute distress Mental status: Alert, oriented x 3 (person, place, & time)       Respiratory: No evidence of acute respiratory distress Eyes: PERLA Vitals: BP (!) 152/91   [name] 78   Temp (!) 97.5 F (36.4 C)   Ht 6' (1.829 m)   Wt 179 lb (81.2 kg)   SpO2 99%   BMI 24.28 kg/m  BMI: Estimated body mass index is 24.28 kg/m as calculated from the following:   Height as of this encounter: 6' (1.829 m).   Weight as of this encounter: 179 lb (81.2 kg). Ideal: Ideal body weight: 77.6 kg (171 lb 1.2 oz) Adjusted ideal body weight: 79 kg (174 lb 3.9 oz)  Assessment   Diagnosis Status  1. Chronic pain syndrome   2. Pain management contract signed   3. Diverticular disease of colon   4. Generalized abdominal pain   5. History of abdominal surgery    Controlled Controlled Controlled    Plan of Care   Mr. Toretto Topping Weismiller has a current medication list which includes the following long-term medication(s): dicyclomine and omeprazole.  Pharmacotherapy (Medications Ordered): Meds ordered this encounter  Medications   HYDROcodone-acetaminophen (NORCO/VICODIN) 5-325 MG tablet    Sig: Take 1 tablet by mouth every 8 (eight) hours as needed for moderate pain (pain score 4-6).    Dispense:  90 tablet    Refill:  0   No orders of the defined types were placed in this encounter.   Follow-up plan:   Return in about 3 months (around 04/27/2023) for MM, F2F.    Recent Visits Date Type Provider Dept  10/26/22 Office Visit Edward Jolly, MD [facility]  Showing recent visits within past 90 days and meeting all other requirements Today's Visits Date Type Provider Dept  01/11/23 Office Visit Edward Jolly, MD [facility]  Showing today's visits and meeting all other requirements Future Appointments No visits were found meeting these conditions. Showing future appointments within next 90 days and  meeting all other requirements  I discussed the assessment and treatment plan with the patient. The patient was provided an opportunity to ask questions and all were answered. The patient agreed with the plan and demonstrated an understanding of the instructions.  Patient advised to call back or seek an in-person evaluation if the symptoms or condition worsens.  Duration of encounter: .  Note by: Edward Jolly, MD Date: 01/11/2023; Time: 3:26 PM

## 2023-01-20 ENCOUNTER — Encounter
Payer: No Typology Code available for payment source | Admitting: Student in an Organized Health Care Education/Training Program

## 2023-04-26 ENCOUNTER — Ambulatory Visit
Payer: No Typology Code available for payment source | Attending: Student in an Organized Health Care Education/Training Program | Admitting: Student in an Organized Health Care Education/Training Program

## 2023-04-26 ENCOUNTER — Encounter: Payer: Self-pay | Admitting: Student in an Organized Health Care Education/Training Program

## 2023-04-26 VITALS — BP 145/93 | HR 70 | Temp 97.3°F | Resp 14 | Ht 72.0 in | Wt 173.0 lb

## 2023-04-26 DIAGNOSIS — K5730 Diverticulosis of large intestine without perforation or abscess without bleeding: Secondary | ICD-10-CM | POA: Diagnosis present

## 2023-04-26 DIAGNOSIS — G894 Chronic pain syndrome: Secondary | ICD-10-CM | POA: Diagnosis present

## 2023-04-26 DIAGNOSIS — R1084 Generalized abdominal pain: Secondary | ICD-10-CM | POA: Diagnosis not present

## 2023-04-26 DIAGNOSIS — Z0289 Encounter for other administrative examinations: Secondary | ICD-10-CM | POA: Insufficient documentation

## 2023-04-26 DIAGNOSIS — Z9889 Other specified postprocedural states: Secondary | ICD-10-CM | POA: Insufficient documentation

## 2023-04-26 MED ORDER — HYDROCODONE-ACETAMINOPHEN 5-325 MG PO TABS: 1.0000 | ORAL_TABLET | Freq: Three times a day (TID) | ORAL | 0 refills | Status: AC | PRN

## 2023-04-26 NOTE — Progress Notes (Signed)
Nursing Pain Medication Assessment:  Safety precautions to be maintained throughout the outpatient stay will include: orient to surroundings, keep bed in low position, maintain call bell within reach at all times, provide assistance with transfer out of bed and ambulation.  Medication Inspection Compliance: Pill count conducted under aseptic conditions, in front of the patient. Neither the pills nor the bottle was removed from the patient's sight at any time. Once count was completed pills were immediately returned to the patient in their original bottle.  Medication: Hydrocodone/APAP Pill/Patch Count:  0 of 90 pills remain Pill/Patch Appearance: Markings consistent with prescribed medication Bottle Appearance: Standard pharmacy container. Clearly labeled. Filled Date: 4 / 06 / 2025 Last Medication intake:  YesterdaySafety precautions to be maintained throughout the outpatient stay will include: orient to surroundings, keep bed in low position, maintain call bell within reach at all times, provide assistance with transfer out of bed and ambulation.

## 2023-04-26 NOTE — Patient Instructions (Signed)

## 2023-04-26 NOTE — Progress Notes (Signed)
PROVIDER NOTE: Information contained herein reflects review and annotations entered in association with encounter. Interpretation of such information and data should be left to medically-trained personnel. Information provided to patient can be located elsewhere in the medical record under "Patient Instructions". Document created using STT-dictation technology, any transcriptional errors that may result from process are unintentional.    Patient: [name]  Service Category: E/M  Provider: Edward Jolly, MD  DOB: [birth year]  DOS: 04/26/2023  Specialty: Interventional Pain Management  MRN: [id number]  Setting: Ambulatory outpatient  PCP: Patient, No Pcp Per  Type: Established Patient    Referring Provider: No ref. provider found  Location: Office  Delivery: Face-to-face     HPI  [name], a 53 y.o. year old male, is here today because of his Chronic pain syndrome [G89.4]. [name] primary complain today is Abdominal Pain Last encounter: My last encounter with him was on 10/26/22 Pertinent problems: [name] has History of abdominal surgery; Generalized abdominal pain; Chronic pain syndrome; Diverticular disease of colon; and Pain management contract signed on their pertinent problem list. Pain Assessment: Severity of Chronic pain is reported as a 8 /10. Location: Abdomen  / . Onset: More than a month ago. Quality: Burning, Sharp. Timing: Intermittent. Modifying factor(s): medications. Vitals:  height is 6' (1.829 m) and weight is 173 lb (78.5 kg). His temporal temperature is 97.3 F (36.3 C) (abnormal). His blood pressure is 145/93 (abnormal) and his pulse is 70. His respiration is 14 and oxygen saturation is 97%.   Reason for encounter: medication management.   No change in medical history since last visit.  Patient's pain is at baseline.  Patient continues multimodal pain regimen as prescribed.  States that it provides pain relief and improvement in functional  status.   10/26/22 Patient presents today for medication management.  No significant change in his medical history since her last clinic visit.  He is working and finds that taking 1 hydrocodone a day helps his functional status and helps to manage his pain.   Patient's previous urine toxicology screen was inappropriately positive for codeine.  Patient states that he had a codeine prescription available from his previous surgery and took 2 tablets. I informed the patient that this constitutes a violation of his pain contract however I will get a repeat urine toxicology screening and any subsequent urine toxicology screens should be negative for anything that he is not prescribed..  Patient endorsed understanding.   Pharmacotherapy Assessment  Analgesic: Hydrocodone 5 mg daily prn    Monitoring: [place] PMP: PDMP reviewed during this encounter.       Pharmacotherapy: No side-effects or adverse reactions reported. Compliance: No problems identified. Effectiveness: Clinically acceptable.  Concepcion Elk, RN  04/26/2023  1:06 PM  Sign when Signing Visit Nursing Pain Medication Assessment:  Safety precautions to be maintained throughout the outpatient stay will include: orient to surroundings, keep bed in low position, maintain call bell within reach at all times, provide assistance with transfer out of bed and ambulation.  Medication Inspection Compliance: Pill count conducted under aseptic conditions, in front of the patient. Neither the pills nor the bottle was removed from the patient's sight at any time. Once count was completed pills were immediately returned to the patient in their original bottle.  Medication: Hydrocodone/APAP Pill/Patch Count:  0 of 90 pills remain Pill/Patch Appearance: Markings consistent with prescribed medication Bottle Appearance: Standard pharmacy container. Clearly labeled. Filled Date: 35 / 06 / 2025 Last Medication intake:  YesterdaySafety precautions to be  maintained throughout the outpatient stay will include: orient to surroundings, keep bed in low position, maintain call bell within reach at all times, provide assistance with transfer out of bed and ambulation.   UDS:  Summary  Date Value Ref Range Status  11/03/2022 Note  Final    Comment:    ==================================================================== [employer] [facility] (MW) ==================================================================== Test                             Result       Flag       Units  Drug Present and Declared for Prescription Verification   Hydrocodone                    418          EXPECTED   ng/mg creat   Dihydrocodeine                 29           EXPECTED   ng/mg creat   Norhydrocodone                 468          EXPECTED   ng/mg creat    Sources of hydrocodone include scheduled prescription medications.    Dihydrocodeine and norhydrocodone are expected metabolites of    hydrocodone. Dihydrocodeine is also available as a scheduled    prescription medication.  ==================================================================== Test                      Result    Flag   Units      Ref Range   Creatinine              237              mg/dL      >=47 ==================================================================== Declared Medications:  The flagging and interpretation on this report are based on the  following declared medications.  Unexpected results may arise from  inaccuracies in the declared medications.   **Note: The testing scope of this panel includes these medications:   Hydrocodone (Norco)   **Note: The testing scope of this panel does not include the  following reported medications:   Acetaminophen (Norco)  Dicyclomine (Bentyl)  Omeprazole ==================================================================== For clinical consultation, please call [phone]. ====================================================================      ROS  Constitutional: Denies any fever or chills Gastrointestinal:  Chronic abdominal pain Musculoskeletal:  Abdominal pain Neurological: No reported episodes of acute onset apraxia, aphasia, dysarthria, agnosia, amnesia, paralysis, loss of coordination, or loss of consciousness  Medication Review  HYDROcodone-acetaminophen, dicyclomine, and omeprazole  History Review  Allergy: [name] is allergic to prednisone. Drug: [name]  reports no history of drug use. Alcohol:  reports no history of alcohol use. Tobacco:  reports that he quit smoking about 8 years ago. His smoking use included cigarettes. He has never used smokeless tobacco. Social: Mr. Kenton  reports that he quit smoking about 8 years ago. His smoking use included cigarettes. He has never used smokeless tobacco. He reports that he does not drink alcohol and does not use drugs. Medical:  has a past medical history of Abdominal pain (2011), Collagen vascular disease (HCC), Diverticulitis, History of hiatal hernia, and Small bowel obstruction (HCC). Surgical: [name]  has a past surgical history that includes Colon resection; Cholecystectomy; arm surgery; loop ileostomy  and repair of anastomotic leak at sigmoid colon (2011); ileostomy takedown (2011); Hemorrhoid surgery (N/A, 01/16/2019); Colonoscopy with propofol (N/A, 03/05/2019); Esophagogastroduodenoscopy (egd) with propofol (N/A, 03/05/2019); and Colon surgery (2011). Family: family history includes Heart disease in his father and mother.  Laboratory Chemistry Profile   Renal Lab Results  Component Value Date   BUN 18 02/12/2022   CREATININE 1.00 02/12/2022   BCR 10 12/20/2019   GFRAA 79 12/20/2019   GFRNONAA >60 02/12/2022    Hepatic Lab Results  Component Value Date   AST 24 02/12/2022   ALT 18 02/12/2022   ALBUMIN 4.3 02/12/2022   ALKPHOS 46 02/12/2022   AMYLASE 49  09/02/2007   LIPASE 29 02/12/2022    Electrolytes Lab Results  Component Value Date   NA 140 02/12/2022   K 3.6 02/12/2022   CL 105 02/12/2022   CALCIUM 9.5 02/12/2022   MG 2.0 12/02/2014   PHOS 2.4 (L) 12/02/2014    Bone Lab Results  Component Value Date   VD25OH 24.9 (L) 12/20/2019    Inflammation (CRP: Acute Phase) (ESR: Chronic Phase) Lab Results  Component Value Date   CRP <1 02/28/2019   LATICACIDVEN 1.8 03/14/2020         Note: Above Lab results reviewed.  Recent Imaging Review  CT ABDOMEN PELVIS W CONTRAST CLINICAL DATA:  Generalized abdominal pain which is acute since last night. History of small-bowel obstructions.  EXAM: CT ABDOMEN AND PELVIS WITH CONTRAST  TECHNIQUE: Multidetector CT imaging of the abdomen and pelvis was performed using the standard protocol following bolus administration of intravenous contrast.  RADIATION DOSE REDUCTION: This exam was performed according to the departmental dose-optimization program which includes automated exposure control, adjustment of the mA and/or kV according to patient size and/or use of iterative reconstruction technique.  CONTRAST:  OMNIPAQUE IOHEXOL 300 MG/ML  SOLN  COMPARISON:  03/14/2020  FINDINGS: Lower chest: Lung bases are normal. Possible small sliding hiatal hernia.  Hepatobiliary: Previous cholecystectomy. Liver and biliary tree are normal.  Pancreas: Normal.  Spleen: Normal.  Adrenals/Urinary Tract: Adrenal glands are normal. Kidneys are normal size without focal mass, hydronephrosis or nephrolithiasis. Ureters and bladder are normal.  Stomach/Bowel: Possible small sliding hiatal hernia as the stomach is otherwise unremarkable.  Small bowel demonstrates a surgical suture line over the ileum in the right lower quadrant. There is a second suture line over a loop of small bowel also in the right lower quadrant more towards the midline as this loop of bowel is chronically dilated  and measures 4 cm in diameter. There is an abrupt transition of this chronic dilated postsurgical loop to a normal caliber segment of ileum in the right lower quadrant. There is interval worsening of fluid-filled dilated small bowel loops in the pelvis measuring up to 3.4 cm in diameter. There is an abrupt transition of normal caliber jejunal loops to this more distally dilated segment in the left mid to lower abdomen. There is wall thickening of the small bowel segment just proximal to these dilated small bowel loops and abrupt transition. Findings may be due to an underlying enteritis. Colon is unremarkable. Appendix is normal.  Vascular/Lymphatic: Minimal calcified plaque over the abdominal aorta which is normal in caliber. No significant adenopathy.  Reproductive: Normal.  Other: Tiny amount of free fluid in the pelvis. No free peritoneal air.  Musculoskeletal: No focal abnormality.  IMPRESSION: 1. Evidence of previous small-bowel surgery in the right lower quadrant. There is interval worsening of fluid-filled dilated small bowel  loops in the pelvis measuring up to 3.4 cm in diameter. There is wall thickening of a short segment of small bowel in the left mid to lower abdomen just proximal to these dilated small bowel loops. Findings may be due to early/partial small bowel obstruction and may be related to an underlying enteritis. Tiny amount of free fluid in the pelvis. 2. Possible small sliding hiatal hernia. 3. Aortic atherosclerosis.  Aortic Atherosclerosis (ICD10-I70.0).  Electronically Signed   By: Elberta Fortis M.D.   On: 02/12/2022 08:31 Note: Reviewed        Physical Exam  General appearance: Well nourished, well developed, and well hydrated. In no apparent acute distress Mental status: Alert, oriented x 3 (person, place, & time)       Respiratory: No evidence of acute respiratory distress Eyes: PERLA Vitals: BP (!) 145/93 (BP Location: Right Arm, Patient  Position: Sitting, Cuff Size: Normal)   Pulse 70   Temp (!) 97.3 F (36.3 C) (Temporal)   Resp 14   Ht 6' (1.829 m)   Wt 173 lb (78.5 kg)   SpO2 97%   BMI 23.46 kg/m  BMI: Estimated body mass index is 23.46 kg/m as calculated from the following:   Height as of this encounter: 6' (1.829 m).   Weight as of this encounter: 173 lb (78.5 kg). Ideal: Ideal body weight: 77.6 kg (171 lb 1.2 oz) Adjusted ideal body weight: 77.9 kg (171 lb 13.5 oz)  Assessment   Diagnosis Status  1. Chronic pain syndrome   2. Pain management contract signed   3. Diverticular disease of colon   4. Generalized abdominal pain   5. History of abdominal surgery    Controlled Controlled Controlled    Plan of Care   [name] has a current medication list which includes the following long-term medication(s): dicyclomine and omeprazole.  Pharmacotherapy (Medications Ordered): Meds ordered this encounter  Medications   HYDROcodone-acetaminophen (NORCO/VICODIN) 5-325 MG tablet    Sig: Take 1 tablet by mouth 3 (three) times daily as needed for severe pain (pain score 7-10). Must last 30 days    Dispense:  90 tablet    Refill:  0    Chronic Pain: STOP Act (Not applicable) Fill 1 day early if closed on refill date. Avoid benzodiazepines within 8 hours of opioids   No orders of the defined types were placed in this encounter.   Follow-up plan:   Return in about 3 months (around 07/24/2023) for MM, F2F, add to NP list.    Recent Visits No visits were found meeting these conditions. Showing recent visits within past 90 days and meeting all other requirements Today's Visits Date Type Provider Dept  04/26/23 Office Visit Edward Jolly, MD [facility]  Showing today's visits and meeting all other requirements Future Appointments No visits were found meeting these conditions. Showing future appointments within next 90 days and meeting all other requirements  I discussed the assessment and  treatment plan with the patient. The patient was provided an opportunity to ask questions and all were answered. The patient agreed with the plan and demonstrated an understanding of the instructions.  Patient advised to call back or seek an in-person evaluation if the symptoms or condition worsens.  Duration of encounter: .  Note by: Edward Jolly, MD Date: 04/26/2023; Time: 1:15 PM

## 2023-07-18 DIAGNOSIS — Z79899 Other long term (current) drug therapy: Secondary | ICD-10-CM | POA: Insufficient documentation

## 2023-07-18 NOTE — Progress Notes (Unsigned)
 PROVIDER NOTE: Interpretation of information contained herein should be left to medically-trained personnel. Specific patient instructions are provided elsewhere under "Patient Instructions" section of medical record. This document was created in part using AI and STT-dictation technology, any transcriptional errors that may result from this process are unintentional.  Patient: [name]  Service: E/M   PCP: Patient, No Pcp Per  DOB: [birth year]  DOS: 07/19/2023  Provider: Cherylin Corrigan, NP  MRN: [id number]  Delivery: Face-to-face  Specialty: Interventional Pain Management  Type: Established Patient  Setting: Ambulatory outpatient facility  Specialty designation: 09  Referring Prov.: No ref. provider found  Location: Outpatient office facility       HPI  [name], a 53 y.o. year old male, is here today because of his No primary diagnosis found.. [name] primary complain today is No chief complaint on file.  Pertinent problems: [name] does not have any pertinent problems on file. Pain Assessment: Severity of   is reported as a  /10. Location:    / . Onset:  . Quality:  . Timing:  . Modifying factor(s):  [name] Vitals:  vitals were not taken for this visit.  BMI: Estimated body mass index is 23.46 kg/m as calculated from the following:   Height as of 04/26/23: 6' (1.829 m).   Weight as of 04/26/23: 173 lb (78.5 kg). Last encounter: Visit date not found. Last procedure: Visit date not found.  Reason for encounter: medication management. No change in medical history since last visit.  Patient's pain is at baseline.  Patient continues multimodal pain regimen as prescribed.  States that it provides pain relief and improvement in functional status. Pharmacotherapy Assessment  Analgesic: {There is no content from the last Subjective section.}   Monitoring: [place] PMP: PDMP not reviewed this encounter.       Pharmacotherapy: No side-effects or adverse reactions reported. Compliance: No problems  identified. Effectiveness: Clinically acceptable.  No notes on file  No results found for: "CBDTHCR" No results found for: "D8THCCBX" No results found for: "D9THCCBX"  UDS:  Summary  Date Value Ref Range Status  11/03/2022 Note  Final    Comment:    ==================================================================== [facility] (MW) ==================================================================== Test                             Result       Flag       Units  Drug Present and Declared for Prescription Verification   Hydrocodone                     418          EXPECTED   ng/mg creat   Dihydrocodeine                 29           EXPECTED   ng/mg creat   Norhydrocodone                 468          EXPECTED   ng/mg creat    Sources of hydrocodone  include scheduled prescription medications.    Dihydrocodeine and norhydrocodone are expected metabolites of    hydrocodone . Dihydrocodeine is also available as a scheduled    prescription medication.  ==================================================================== Test                      Result  Flag   Units      Ref Range   Creatinine              237              mg/dL      >=41 ==================================================================== Declared Medications:  The flagging and interpretation on this report are based on the  following declared medications.  Unexpected results may arise from  inaccuracies in the declared medications.   **Note: The testing scope of this panel includes these medications:   Hydrocodone  (Norco)   **Note: The testing scope of this panel does not include the  following reported medications:   Acetaminophen  (Norco)  Dicyclomine  (Bentyl )  Omeprazole  ==================================================================== For clinical consultation, please call [phone]. ====================================================================       ROS  Constitutional:  Denies any fever or chills Gastrointestinal: No reported hemesis, hematochezia, vomiting, or acute GI distress Musculoskeletal: Denies any acute onset joint swelling, redness, loss of ROM, or weakness Neurological: No reported episodes of acute onset apraxia, aphasia, dysarthria, agnosia, amnesia, paralysis, loss of coordination, or loss of consciousness  Medication Review  dicyclomine  and omeprazole   History Review  Allergy: [name] is allergic to prednisone. Drug: [name]  reports no history of drug use. Alcohol:  reports no history of alcohol use. Tobacco:  reports that he quit smoking about 8 years ago. His smoking use included cigarettes. He has never used smokeless tobacco. Social: [name]  reports that he quit smoking about 8 years ago. His smoking use included cigarettes. He has never used smokeless tobacco. He reports that he does not drink alcohol and does not use drugs. Medical:  has a past medical history of Abdominal pain (2011), Collagen vascular disease (HCC), Diverticulitis, History of hiatal hernia, and Small bowel obstruction (HCC). Surgical: [name]  has a past surgical history that includes Colon resection; Cholecystectomy; arm surgery; loop ileostomy and repair of anastomotic leak at sigmoid colon (2011); ileostomy takedown (2011); Hemorrhoid surgery (N/A, 01/16/2019); Colonoscopy with propofol  (N/A, 03/05/2019); Esophagogastroduodenoscopy (egd) with propofol  (N/A, 03/05/2019); and Colon surgery (2011). Family: family history includes Heart disease in his father and mother.  Laboratory Chemistry Profile   Renal Lab Results  Component Value Date   BUN 18 02/12/2022   CREATININE 1.00 02/12/2022   BCR 10 12/20/2019   GFRAA 79 12/20/2019   GFRNONAA >60 02/12/2022    Hepatic Lab Results  Component Value Date   AST 24 02/12/2022   ALT 18 02/12/2022   ALBUMIN 4.3 02/12/2022   ALKPHOS 46 02/12/2022   AMYLASE 49 09/02/2007   LIPASE 29 02/12/2022     Electrolytes Lab Results  Component Value Date   NA 140 02/12/2022   K 3.6 02/12/2022   CL 105 02/12/2022   CALCIUM 9.5 02/12/2022   MG 2.0 12/02/2014   PHOS 2.4 (L) 12/02/2014    Bone Lab Results  Component Value Date   VD25OH 24.9 (L) 12/20/2019    Inflammation (CRP: Acute Phase) (ESR: Chronic Phase) Lab Results  Component Value Date   CRP <1 02/28/2019   LATICACIDVEN 1.8 03/14/2020         Note: Above Lab results reviewed.  Recent Imaging Review  CT ABDOMEN PELVIS W CONTRAST CLINICAL DATA:  Generalized abdominal pain which is acute since last night. History of small-bowel obstructions.  EXAM: CT ABDOMEN AND PELVIS WITH CONTRAST  TECHNIQUE: Multidetector CT imaging of the abdomen and pelvis was performed using the standard protocol following bolus administration of intravenous contrast.  RADIATION DOSE REDUCTION: This exam was performed according to the departmental dose-optimization program which includes automated exposure control, adjustment of the mA and/or kV according to patient size and/or use of iterative reconstruction technique.  CONTRAST:  OMNIPAQUE  IOHEXOL  300 MG/ML  SOLN  COMPARISON:  03/14/2020  FINDINGS: Lower chest: Lung bases are normal. Possible small sliding hiatal hernia.  Hepatobiliary: Previous cholecystectomy. Liver and biliary tree are normal.  Pancreas: Normal.  Spleen: Normal.  Adrenals/Urinary Tract: Adrenal glands are normal. Kidneys are normal size without focal mass, hydronephrosis or nephrolithiasis. Ureters and bladder are normal.  Stomach/Bowel: Possible small sliding hiatal hernia as the stomach is otherwise unremarkable.  Small bowel demonstrates a surgical suture line over the ileum in the right lower quadrant. There is a second suture line over a loop of small bowel also in the right lower quadrant more towards the midline as this loop of bowel is chronically dilated and measures 4 cm in diameter. There  is an abrupt transition of this chronic dilated postsurgical loop to a normal caliber segment of ileum in the right lower quadrant. There is interval worsening of fluid-filled dilated small bowel loops in the pelvis measuring up to 3.4 cm in diameter. There is an abrupt transition of normal caliber jejunal loops to this more distally dilated segment in the left mid to lower abdomen. There is wall thickening of the small bowel segment just proximal to these dilated small bowel loops and abrupt transition. Findings may be due to an underlying enteritis. Colon is unremarkable. Appendix is normal.  Vascular/Lymphatic: Minimal calcified plaque over the abdominal aorta which is normal in caliber. No significant adenopathy.  Reproductive: Normal.  Other: Tiny amount of free fluid in the pelvis. No free peritoneal air.  Musculoskeletal: No focal abnormality.  IMPRESSION: 1. Evidence of previous small-bowel surgery in the right lower quadrant. There is interval worsening of fluid-filled dilated small bowel loops in the pelvis measuring up to 3.4 cm in diameter. There is wall thickening of a short segment of small bowel in the left mid to lower abdomen just proximal to these dilated small bowel loops. Findings may be due to early/partial small bowel obstruction and may be related to an underlying enteritis. Tiny amount of free fluid in the pelvis. 2. Possible small sliding hiatal hernia. 3. Aortic atherosclerosis.  Aortic Atherosclerosis (ICD10-I70.0).  Electronically Signed   By: Roda Cirri M.D.   On: 02/12/2022 08:31 Note: Reviewed        Physical Exam  General appearance: Well nourished, well developed, and well hydrated. In no apparent acute distress Mental status: Alert, oriented x 3 (person, place, & time)       Respiratory: No evidence of acute respiratory distress Eyes: PERLA Vitals: There were no vitals taken for this visit. BMI: Estimated body mass index is 23.46  kg/m as calculated from the following:   Height as of 04/26/23: 6' (1.829 m).   Weight as of 04/26/23: 173 lb (78.5 kg). Ideal: Patient weight not recorded  Assessment   Diagnosis Status  No diagnosis found. Controlled Controlled Controlled   Updated Problems: No problems updated.  Plan of Care  Problem-specific:  Assessment and Plan            [name] has a current medication list which includes the following long-term medication(s): dicyclomine  and omeprazole .  Pharmacotherapy (Medications Ordered): No orders of the defined types were placed in this encounter.  Orders:  No orders of the defined types were placed in  this encounter.  Follow-up plan:   No follow-ups on file.     {There is no content from the last Plan section.}   Recent Visits Date Type Provider Dept  04/26/23 Office Visit Cephus Collin, MD [facility]  Showing recent visits within past 90 days and meeting all other requirements Future Appointments Date Type Provider Dept  07/19/23 Appointment Brock Mokry K, NP [facility]  Showing future appointments within next 90 days and meeting all other requirements  I discussed the assessment and treatment plan with the patient. The patient was provided an opportunity to ask questions and all were answered. The patient agreed with the plan and demonstrated an understanding of the instructions.  Patient advised to call back or seek an in-person evaluation if the symptoms or condition worsens.  Duration of encounter: *** minutes.  Total time on encounter, as per AMA guidelines included both the face-to-face and non-face-to-face time personally spent by the physician and/or other qualified health care professional(s) on the day of the encounter (includes time in activities that require the physician or other qualified health care professional and does not include time in activities normally performed by clinical staff). Physician's time  may include the following activities when performed: Preparing to see the patient (e.g., pre-charting review of records, searching for previously ordered imaging, lab work, and nerve conduction tests) Review of prior analgesic pharmacotherapies. Reviewing PMP Interpreting ordered tests (e.g., lab work, imaging, nerve conduction tests) Performing post-procedure evaluations, including interpretation of diagnostic procedures Obtaining and/or reviewing separately obtained history Performing a medically appropriate examination and/or evaluation Counseling and educating the patient/family/caregiver Ordering medications, tests, or procedures Referring and communicating with other health care professionals (when not separately reported) Documenting clinical information in the electronic or other health record Independently interpreting results (not separately reported) and communicating results to the patient/ family/caregiver Care coordination (not separately reported)  Note by: Trust Leh K Dedrick Heffner, NP (TTS and AI technology used. I apologize for any typographical errors that were not detected and corrected.) Date: 07/19/2023; Time: 3:34 PM

## 2023-07-19 ENCOUNTER — Ambulatory Visit
Payer: No Typology Code available for payment source | Attending: Student in an Organized Health Care Education/Training Program | Admitting: Nurse Practitioner

## 2023-07-19 ENCOUNTER — Encounter: Payer: Self-pay | Admitting: Nurse Practitioner

## 2023-07-19 VITALS — BP 140/90 | HR 66 | Temp 97.7°F | Resp 16 | Ht 72.0 in | Wt 183.0 lb

## 2023-07-19 DIAGNOSIS — G894 Chronic pain syndrome: Secondary | ICD-10-CM | POA: Insufficient documentation

## 2023-07-19 DIAGNOSIS — Z9889 Other specified postprocedural states: Secondary | ICD-10-CM | POA: Insufficient documentation

## 2023-07-19 DIAGNOSIS — Z79899 Other long term (current) drug therapy: Secondary | ICD-10-CM | POA: Diagnosis present

## 2023-07-19 DIAGNOSIS — K5730 Diverticulosis of large intestine without perforation or abscess without bleeding: Secondary | ICD-10-CM | POA: Diagnosis present

## 2023-07-19 DIAGNOSIS — R1084 Generalized abdominal pain: Secondary | ICD-10-CM | POA: Insufficient documentation

## 2023-07-19 MED ORDER — HYDROCODONE-ACETAMINOPHEN 5-325 MG PO TABS: 1.0000 | ORAL_TABLET | Freq: Three times a day (TID) | ORAL | 0 refills | Status: DC | PRN

## 2023-07-19 MED ORDER — HYDROCODONE-ACETAMINOPHEN 5-325 MG PO TABS: 1.0000 | ORAL_TABLET | Freq: Three times a day (TID) | ORAL | 0 refills | Status: AC | PRN

## 2023-07-19 NOTE — Progress Notes (Signed)
 Nursing Pain Medication Assessment:  Safety precautions to be maintained throughout the outpatient stay will include: orient to surroundings, keep bed in low position, maintain call bell within reach at all times, provide assistance with transfer out of bed and ambulation.   Medication Inspection Compliance: Pill count conducted under aseptic conditions, in front of the patient. Neither the pills nor the bottle was removed from the patient's sight at any time. Once count was completed pills were immediately returned to the patient in their original bottle.  Medication: Hydrocodone /APAP Pill/Patch Count:  0 of 90 pills remain Pill/Patch Appearance: Markings consistent with prescribed medication Bottle Appearance: Standard pharmacy container. Clearly labeled. Filled Date: 02 / 04 / 2025 Last Medication intake:  Today

## 2023-09-05 ENCOUNTER — Telehealth: Payer: Self-pay | Admitting: Student in an Organized Health Care Education/Training Program

## 2023-09-05 NOTE — Telephone Encounter (Signed)
 Patient informed that FMLA form is here, he can pick it up today.

## 2023-09-05 NOTE — Telephone Encounter (Signed)
 PT called stated that he drop FMLA papers off on last Friday. PT stated he needs to return paper work by tomorrow. PT needs to come by today. Please give patient a call. TY

## 2023-10-13 ENCOUNTER — Encounter: Payer: Self-pay | Admitting: Nurse Practitioner

## 2023-10-13 ENCOUNTER — Ambulatory Visit: Attending: Nurse Practitioner | Admitting: Nurse Practitioner

## 2023-10-13 VITALS — BP 149/95 | HR 60 | Temp 97.7°F | Resp 16 | Ht 72.0 in | Wt 179.0 lb

## 2023-10-13 DIAGNOSIS — K5730 Diverticulosis of large intestine without perforation or abscess without bleeding: Secondary | ICD-10-CM | POA: Insufficient documentation

## 2023-10-13 DIAGNOSIS — R1084 Generalized abdominal pain: Secondary | ICD-10-CM | POA: Diagnosis not present

## 2023-10-13 DIAGNOSIS — Z79899 Other long term (current) drug therapy: Secondary | ICD-10-CM | POA: Insufficient documentation

## 2023-10-13 DIAGNOSIS — Z9889 Other specified postprocedural states: Secondary | ICD-10-CM | POA: Diagnosis not present

## 2023-10-13 DIAGNOSIS — G894 Chronic pain syndrome: Secondary | ICD-10-CM | POA: Insufficient documentation

## 2023-10-13 MED ORDER — HYDROCODONE-ACETAMINOPHEN 5-325 MG PO TABS
1.0000 | ORAL_TABLET | Freq: Three times a day (TID) | ORAL | 0 refills | Status: AC | PRN
Start: 2023-11-06 — End: 2023-12-06

## 2023-10-13 MED ORDER — HYDROCODONE-ACETAMINOPHEN 5-325 MG PO TABS
1.0000 | ORAL_TABLET | Freq: Three times a day (TID) | ORAL | 0 refills | Status: DC | PRN
Start: 2024-01-05 — End: 2024-01-09

## 2023-10-13 MED ORDER — HYDROCODONE-ACETAMINOPHEN 5-325 MG PO TABS
1.0000 | ORAL_TABLET | Freq: Three times a day (TID) | ORAL | 0 refills | Status: AC | PRN
Start: 2023-12-06 — End: 2024-01-05

## 2023-10-13 NOTE — Progress Notes (Signed)
 PROVIDER NOTE: Interpretation of information contained herein should be left to medically-trained personnel. Specific patient instructions are provided elsewhere under Patient Instructions section of medical record. This document was created in part using AI and STT-dictation technology, any transcriptional errors that may result from this process are unintentional.  Patient: [name]  Service: E/M   PCP: Patient, No Pcp Per  DOB: 10-17-1970  DOS: 10/13/2023  Provider: Emmy MARLA Blanch, NP  MRN: [id number]  Delivery: Face-to-face  Specialty: Interventional Pain Management  Type: Established Patient  Setting: Ambulatory outpatient facility  Specialty designation: 09  Referring Prov.: No ref. provider found  Location: Outpatient office facility       History of present illness (HPI) [name], a 53 y.o. year old male, is here today because of his Chronic pain syndrome [G89.4]. Mr. Zeimet primary complain today is Abdominal Pain  Pertinent problems: [name] has history of abdominal surgery; generalized abdominal pain; diverticular disease of colon; and chronic pain syndrome on their pertinent problem list.  Pain Assessment: Severity of Chronic pain is reported as a 5 /10. Location: Abdomen Anterior, Lower/Denies. Onset: More than a month ago. Quality: Constant, Aching, Stabbing, Shooting, Sharp, Nagging, Grimacing. Timing: Constant. Modifying factor(s): Pain medication. Vitals:  height is 6' (1.829 m) and weight is 179 lb (81.2 kg). His temporal temperature is 97.7 F (36.5 C). His blood pressure is 149/95 (abnormal) and his pulse is 60. His respiration is 16 and oxygen saturation is 98%.  BMI: Estimated body mass index is 24.28 kg/m as calculated from the following:   Height as of this encounter: 6' (1.829 m).   Weight as of this encounter: 179 lb (81.2 kg).  Last encounter: 07/19/2023. Last procedure: Visit date not found.  Reason for encounter: medication management. No change in  medical history since last visit.  Patient's pain is at baseline.  Patient continues multimodal pain regimen as prescribed.  States that it provides pain relief and improvement in functional status.   Pharmacotherapy Assessment   Hydrocodone -acetaminophen  (Norco/Vicodin) 5-325 mg tablet 3 times daily as needed for pain. MME=15  Monitoring: [place] PMP: PDMP reviewed during this encounter.       Pharmacotherapy: No side-effects or adverse reactions reported. Compliance: No problems identified. Effectiveness: Clinically acceptable.  Bonner Norris, RN  10/13/2023  2:19 PM  Sign when Signing Visit Nursing Pain Medication Assessment:  Safety precautions to be maintained throughout the outpatient stay will include: orient to surroundings, keep bed in low position, maintain call bell within reach at all times, provide assistance with transfer out of bed and ambulation.   Medication Inspection Compliance: Pill count conducted under aseptic conditions, in front of the patient. Neither the pills nor the bottle was removed from the patient's sight at any time. Once count was completed pills were immediately returned to the patient in their original bottle.  Medication: Hydrocodone /APAP Pill/Patch Count: 70.5 of 90 pills/patches remain Pill/Patch Appearance: Markings consistent with prescribed medication Bottle Appearance: Standard pharmacy container. Clearly labeled. Filled Date: 07 / 18 / 2025 Last Medication intake:  Today    UDS:  Summary  Date Value Ref Range Status  11/03/2022 Note  Final    Comment:    ==================================================================== [employer] Select 13 (MW) ==================================================================== Test                             Result       Flag       Units  Drug Present and Declared for Prescription Verification   Hydrocodone                     418          EXPECTED   ng/mg creat   Dihydrocodeine                 29            EXPECTED   ng/mg creat   Norhydrocodone                 468          EXPECTED   ng/mg creat    Sources of hydrocodone  include scheduled prescription medications.    Dihydrocodeine and norhydrocodone are expected metabolites of    hydrocodone . Dihydrocodeine is also available as a scheduled    prescription medication.  ==================================================================== Test                      Result    Flag   Units      Ref Range   Creatinine              237              mg/dL      >=79 ==================================================================== Declared Medications:  The flagging and interpretation on this report are based on the  following declared medications.  Unexpected results may arise from  inaccuracies in the declared medications.   **Note: The testing scope of this panel includes these medications:   Hydrocodone  (Norco)   **Note: The testing scope of this panel does not include the  following reported medications:   Acetaminophen  (Norco)  Dicyclomine  (Bentyl )  Omeprazole  ==================================================================== For clinical consultation, please call [phone]. ====================================================================     No results found for: CBDTHCR No results found for: D8THCCBX No results found for: D9THCCBX  ROS  Constitutional: Denies any fever or chills Gastrointestinal: No reported hemesis, hematochezia, vomiting, or acute GI distress Musculoskeletal: Denies any acute onset joint swelling, redness, loss of ROM, or weakness Neurological: No reported episodes of acute onset apraxia, aphasia, dysarthria, agnosia, amnesia, paralysis, loss of coordination, or loss of consciousness  Medication Review  HYDROcodone -acetaminophen , dicyclomine , and omeprazole   History Review  Allergy: [name] is allergic to prednisone. Drug: [name]  reports no history of drug use. Alcohol:   reports no history of alcohol use. Tobacco:  reports that he quit smoking about 8 years ago. His smoking use included cigarettes. He has never used smokeless tobacco. Social: [name]  reports that he quit smoking about 8 years ago. His smoking use included cigarettes. He has never used smokeless tobacco. He reports that he does not drink alcohol and does not use drugs. Medical:  has a past medical history of Abdominal pain (2011), Collagen vascular disease (HCC), Diverticulitis, History of hiatal hernia, and Small bowel obstruction (HCC). Surgical: Mr. Urieta  has a past surgical history that includes Colon resection; Cholecystectomy; arm surgery; loop ileostomy and repair of anastomotic leak at sigmoid colon (2011); ileostomy takedown (2011); Hemorrhoid surgery (N/A, 01/16/2019); Colonoscopy with propofol  (N/A, 03/05/2019); Esophagogastroduodenoscopy (egd) with propofol  (N/A, 03/05/2019); and Colon surgery (2011). Family: family history includes Heart disease in his father and mother.  Laboratory Chemistry Profile   Renal Lab Results  Component Value Date   BUN 18 02/12/2022   CREATININE 1.00 02/12/2022   BCR 10 12/20/2019   GFRAA 79 12/20/2019   GFRNONAA >60 02/12/2022  Hepatic Lab Results  Component Value Date   AST 24 02/12/2022   ALT 18 02/12/2022   ALBUMIN 4.3 02/12/2022   ALKPHOS 46 02/12/2022   AMYLASE 49 09/02/2007   LIPASE 29 02/12/2022    Electrolytes Lab Results  Component Value Date   NA 140 02/12/2022   K 3.6 02/12/2022   CL 105 02/12/2022   CALCIUM 9.5 02/12/2022   MG 2.0 12/02/2014   PHOS 2.4 (L) 12/02/2014    Bone Lab Results  Component Value Date   VD25OH 24.9 (L) 12/20/2019    Inflammation (CRP: Acute Phase) (ESR: Chronic Phase) Lab Results  Component Value Date   CRP <1 02/28/2019   LATICACIDVEN 1.8 03/14/2020         Note: Above Lab results reviewed.  Recent Imaging Review  CT ABDOMEN PELVIS W CONTRAST CLINICAL DATA:  Generalized  abdominal pain which is acute since last night. History of small-bowel obstructions.  EXAM: CT ABDOMEN AND PELVIS WITH CONTRAST  TECHNIQUE: Multidetector CT imaging of the abdomen and pelvis was performed using the standard protocol following bolus administration of intravenous contrast.  RADIATION DOSE REDUCTION: This exam was performed according to the departmental dose-optimization program which includes automated exposure control, adjustment of the mA and/or kV according to patient size and/or use of iterative reconstruction technique.  CONTRAST:  OMNIPAQUE  IOHEXOL  300 MG/ML  SOLN  COMPARISON:  03/14/2020  FINDINGS: Lower chest: Lung bases are normal. Possible small sliding hiatal hernia.  Hepatobiliary: Previous cholecystectomy. Liver and biliary tree are normal.  Pancreas: Normal.  Spleen: Normal.  Adrenals/Urinary Tract: Adrenal glands are normal. Kidneys are normal size without focal mass, hydronephrosis or nephrolithiasis. Ureters and bladder are normal.  Stomach/Bowel: Possible small sliding hiatal hernia as the stomach is otherwise unremarkable.  Small bowel demonstrates a surgical suture line over the ileum in the right lower quadrant. There is a second suture line over a loop of small bowel also in the right lower quadrant more towards the midline as this loop of bowel is chronically dilated and measures 4 cm in diameter. There is an abrupt transition of this chronic dilated postsurgical loop to a normal caliber segment of ileum in the right lower quadrant. There is interval worsening of fluid-filled dilated small bowel loops in the pelvis measuring up to 3.4 cm in diameter. There is an abrupt transition of normal caliber jejunal loops to this more distally dilated segment in the left mid to lower abdomen. There is wall thickening of the small bowel segment just proximal to these dilated small bowel loops and abrupt transition. Findings may be due  to an underlying enteritis. Colon is unremarkable. Appendix is normal.  Vascular/Lymphatic: Minimal calcified plaque over the abdominal aorta which is normal in caliber. No significant adenopathy.  Reproductive: Normal.  Other: Tiny amount of free fluid in the pelvis. No free peritoneal air.  Musculoskeletal: No focal abnormality.  IMPRESSION: 1. Evidence of previous small-bowel surgery in the right lower quadrant. There is interval worsening of fluid-filled dilated small bowel loops in the pelvis measuring up to 3.4 cm in diameter. There is wall thickening of a short segment of small bowel in the left mid to lower abdomen just proximal to these dilated small bowel loops. Findings may be due to early/partial small bowel obstruction and may be related to an underlying enteritis. Tiny amount of free fluid in the pelvis. 2. Possible small sliding hiatal hernia. 3. Aortic atherosclerosis.  Aortic Atherosclerosis (ICD10-I70.0).  Electronically Signed   By: Toribio  Jearld M.D.   On: 02/12/2022 08:31 Note: Reviewed        Physical Exam  Vitals: BP (!) 149/95 (Patient Position: Sitting, Cuff Size: Normal)   Pulse 60   Temp 97.7 F (36.5 C) (Temporal)   Resp 16   Ht 6' (1.829 m)   Wt 179 lb (81.2 kg)   SpO2 98%   BMI 24.28 kg/m  BMI: Estimated body mass index is 24.28 kg/m as calculated from the following:   Height as of this encounter: 6' (1.829 m).   Weight as of this encounter: 179 lb (81.2 kg). Ideal: Ideal body weight: 77.6 kg (171 lb 1.2 oz) Adjusted ideal body weight: 79 kg (174 lb 3.9 oz) General appearance: Well nourished, well developed, and well hydrated. In no apparent acute distress Mental status: Alert, oriented x 3 (person, place, & time)       Respiratory: No evidence of acute respiratory distress Eyes: PERLA   Assessment   Diagnosis Status  1. Chronic pain syndrome   2. Diverticular disease of colon   3. History of abdominal surgery   4. Generalized  abdominal pain   5. Medication management    Controlled Controlled Controlled   Updated Problems: No problems updated.  Plan of Care  Problem-specific:  Assessment and Plan  We will continue on current medication regimen.  Prescription drug monitoring (PDMP) consistent with current medication regimen.  No further issues or problem reported to this visit. Routine UDS ordered today.  Schedule follow-up in 90 days for medication management  Mr. Syair Fricker Payes has a current medication list which includes the following long-term medication(s): dicyclomine  and omeprazole .  Pharmacotherapy (Medications Ordered): Meds ordered this encounter  Medications   HYDROcodone -acetaminophen  (NORCO/VICODIN) 5-325 MG tablet    Sig: Take 1 tablet by mouth 3 (three) times daily as needed for severe pain (pain score 7-10). Must last 30 days    Dispense:  90 tablet    Refill:  0    Chronic Pain: STOP Act (Not applicable) Fill 1 day early if closed on refill date. Avoid benzodiazepines within 8 hours of opioids   HYDROcodone -acetaminophen  (NORCO/VICODIN) 5-325 MG tablet    Sig: Take 1 tablet by mouth 3 (three) times daily as needed for severe pain (pain score 7-10). Must last 30 days    Dispense:  90 tablet    Refill:  0    Chronic Pain: STOP Act (Not applicable) Fill 1 day early if closed on refill date. Avoid benzodiazepines within 8 hours of opioids   HYDROcodone -acetaminophen  (NORCO/VICODIN) 5-325 MG tablet    Sig: Take 1 tablet by mouth 3 (three) times daily as needed for severe pain (pain score 7-10). Must last 30 days    Dispense:  90 tablet    Refill:  0    Chronic Pain: STOP Act (Not applicable) Fill 1 day early if closed on refill date. Avoid benzodiazepines within 8 hours of opioids   Orders:  Orders Placed This Encounter  Procedures   [employer] Select 13 (MW), Urine    Volume: 30 ml(s). Minimum 3 ml of urine is needed. Document temperature of fresh sample. Indications: Long term (current)  use of opiate analgesic (S20.108)    Release to patient:   Immediate        Return in about 3 months (around 01/13/2024) for (F2F), (MM), Emmy Blanch NP.    Recent Visits Date Type Provider Dept  07/19/23 Office Visit Kiegan Macaraeg K, NP [facility]  Showing recent visits  within past 90 days and meeting all other requirements Today's Visits Date Type Provider Dept  10/13/23 Office Visit Bocephus Cali K, NP [facility]  Showing today's visits and meeting all other requirements Future Appointments Date Type Provider Dept  01/09/24 Appointment Lucee Brissett K, NP [facility]  Showing future appointments within next 90 days and meeting all other requirements  I discussed the assessment and treatment plan with the patient. The patient was provided an opportunity to ask questions and all were answered. The patient agreed with the plan and demonstrated an understanding of the instructions.  Patient advised to call back or seek an in-person evaluation if the symptoms or condition worsens.  Duration of encounter: 30 minutes.  Total time on encounter, as per AMA guidelines included both the face-to-face and non-face-to-face time personally spent by the physician and/or other qualified health care professional(s) on the day of the encounter (includes time in activities that require the physician or other qualified health care professional and does not include time in activities normally performed by clinical staff). Physician's time may include the following activities when performed: Preparing to see the patient (e.g., pre-charting review of records, searching for previously ordered imaging, lab work, and nerve conduction tests) Review of prior analgesic pharmacotherapies. Reviewing PMP Interpreting ordered tests (e.g., lab work, imaging, nerve conduction tests) Performing post-procedure evaluations, including interpretation of diagnostic procedures Obtaining and/or  reviewing separately obtained history Performing a medically appropriate examination and/or evaluation Counseling and educating the patient/family/caregiver Ordering medications, tests, or procedures Referring and communicating with other health care professionals (when not separately reported) Documenting clinical information in the electronic or other health record Independently interpreting results (not separately reported) and communicating results to the patient/ family/caregiver Care coordination (not separately reported)  Note by: Jaystin Mcgarvey K Tami Blass, NP (TTS and AI technology used. I apologize for any typographical errors that were not detected and corrected.) Date: 10/13/2023; Time: 2:46 PM

## 2023-10-13 NOTE — Progress Notes (Signed)
 Nursing Pain Medication Assessment:  Safety precautions to be maintained throughout the outpatient stay will include: orient to surroundings, keep bed in low position, maintain call bell within reach at all times, provide assistance with transfer out of bed and ambulation.   Medication Inspection Compliance: Pill count conducted under aseptic conditions, in front of the patient. Neither the pills nor the bottle was removed from the patient's sight at any time. Once count was completed pills were immediately returned to the patient in their original bottle.  Medication: Hydrocodone /APAP Pill/Patch Count: 70.5 of 90 pills/patches remain Pill/Patch Appearance: Markings consistent with prescribed medication Bottle Appearance: Standard pharmacy container. Clearly labeled. Filled Date: 07 / 18 / 2025 Last Medication intake:  Today

## 2023-10-17 LAB — TOXASSURE SELECT 13 (MW), URINE

## 2024-01-09 ENCOUNTER — Ambulatory Visit: Attending: Nurse Practitioner | Admitting: Nurse Practitioner

## 2024-01-09 ENCOUNTER — Encounter: Payer: Self-pay | Admitting: Nurse Practitioner

## 2024-01-09 VITALS — BP 142/92 | HR 58 | Temp 97.4°F | Resp 18 | Ht 72.0 in | Wt 175.0 lb

## 2024-01-09 DIAGNOSIS — Z79899 Other long term (current) drug therapy: Secondary | ICD-10-CM | POA: Insufficient documentation

## 2024-01-09 DIAGNOSIS — R1084 Generalized abdominal pain: Secondary | ICD-10-CM | POA: Insufficient documentation

## 2024-01-09 DIAGNOSIS — G894 Chronic pain syndrome: Secondary | ICD-10-CM | POA: Diagnosis not present

## 2024-01-09 DIAGNOSIS — K5730 Diverticulosis of large intestine without perforation or abscess without bleeding: Secondary | ICD-10-CM | POA: Diagnosis present

## 2024-01-09 DIAGNOSIS — Z9889 Other specified postprocedural states: Secondary | ICD-10-CM | POA: Diagnosis present

## 2024-01-09 MED ORDER — HYDROCODONE-ACETAMINOPHEN 5-325 MG PO TABS: 1.0000 | ORAL_TABLET | Freq: Three times a day (TID) | ORAL | 0 refills | Status: AC | PRN

## 2024-01-09 MED ORDER — HYDROCODONE-ACETAMINOPHEN 5-325 MG PO TABS: 1.0000 | ORAL_TABLET | Freq: Three times a day (TID) | ORAL | 0 refills | Status: DC | PRN

## 2024-01-09 NOTE — Progress Notes (Signed)
 Nursing Pain Medication Assessment:  Safety precautions to be maintained throughout the outpatient stay will include: orient to surroundings, keep bed in low position, maintain call bell within reach at all times, provide assistance with transfer out of bed and ambulation.  Medication Inspection Compliance: Pill count conducted under aseptic conditions, in front of the patient. Neither the pills nor the bottle was removed from the patient's sight at any time. Once count was completed pills were immediately returned to the patient in their original bottle.  Medication: Hydrocodone /APAP Pill/Patch Count: 85 of 90 pills/patches remain Pill/Patch Appearance: Markings consistent with prescribed medication Bottle Appearance: Standard pharmacy container. Clearly labeled. Filled Date: 46 / 17 / 2025 Last Medication intake:  Today

## 2024-01-09 NOTE — Progress Notes (Signed)
 PROVIDER NOTE: Interpretation of information contained herein should be left to medically-trained personnel. Specific patient instructions are provided elsewhere under Patient Instructions section of medical record. This document was created in part using AI and STT-dictation technology, any transcriptional errors that may result from this process are unintentional.  Patient: [name]  Service: E/M   PCP: Patient, No Pcp Per  DOB: 03/01/1971  DOS: 01/09/2024  Provider: Emmy MARLA Blanch, NP  MRN: [id number]  Delivery: Face-to-face  Specialty: Interventional Pain Management  Type: Established Patient  Setting: Ambulatory outpatient facility  Specialty designation: 09  Referring Prov.: No ref. provider found  Location: Outpatient office facility       History of present illness (HPI) [name], a 53 y.o. year old male, is here today because of his Chronic pain syndrome [G89.4]. [name] primary complain today is Abdominal Pain  Pertinent problems: [name] has  history of abdominal surgery; generalized abdominal pain; diverticular disease of colon; and chronic pain syndrome on their pertinent problem list.  Pain Assessment: Severity of Chronic pain is reported as a 3 /10. Location: Abdomen Mid/Denies. Onset: More than a month ago. Quality: Burning, Pounding, Sharp. Timing: Constant. Modifying factor(s): Pain Medication. Vitals:  height is 6' (1.829 m) and weight is 175 lb (79.4 kg). His temporal temperature is 97.4 F (36.3 C) (abnormal). His blood pressure is 142/92 (abnormal) and his pulse is 58 (abnormal). His respiration is 18 and oxygen saturation is 99%.  BMI: Estimated body mass index is 23.73 kg/m as calculated from the following:   Height as of this encounter: 6' (1.829 m).   Weight as of this encounter: 175 lb (79.4 kg).  Last encounter: 10/13/2023. Last procedure: Visit date not found.  Reason for encounter: medication management. No change in medical history since last  visit.  Patient's pain is at baseline.  Patient continues multimodal pain regimen as prescribed.  States that it provides pain relief and improvement in functional status.   Discussed the use of AI scribe software for clinical note transcription with the patient, who gave verbal consent to proceed.  History of Present Illness   [name] is a 53 year old male who presents with abdominal pain.  He experiences abdominal pain in the same area as previous episodes. He takes hydrocodone , Norco 5-325, three times daily, which reduces his pain from an eight or nine to a three or four, allowing him to function and go to work. He emphasizes the importance of not going out on disability due to his pain.  He reports that previous clinicians considered various gastrointestinal conditions, including Crohn's disease, diverticulitis, diverticulosis, and irritable bowel syndrome (IBS). Despite these evaluations, he continues to experience frequent bowel movements, which he attributes to his high water intake. He drinks water throughout the day, carrying bottles with him to work and consuming more at home.  He maintains a diet that excludes fast foods, alcohol, and sodas, preferring to cook his meals at home and eat in moderation. He notes that the hydrocodone  does not cause constipation, which is a common side effect, but rather the opposite, as it 'makes me go'.     Pharmacotherapy Assessment   Hydrocodone -acetaminophen  (Norco/Vicodin) 5-325 mg tablet 3 times daily as needed for pain. MME=15  Monitoring: [name] PMP: PDMP reviewed during this encounter.       Pharmacotherapy: No side-effects or adverse reactions reported. Compliance: No problems identified. Effectiveness: Clinically acceptable.  Erlene Doyal SAUNDERS, CMA  01/09/2024  2:32 PM  Sign when Signing Visit Nursing Pain Medication Assessment:  Safety precautions to be maintained throughout the outpatient stay will include: orient to surroundings, keep bed  in low position, maintain call bell within reach at all times, provide assistance with transfer out of bed and ambulation.  Medication Inspection Compliance: Pill count conducted under aseptic conditions, in front of the patient. Neither the pills nor the bottle was removed from the patient's sight at any time. Once count was completed pills were immediately returned to the patient in their original bottle.  Medication: Hydrocodone /APAP Pill/Patch Count: 85 of 90 pills/patches remain Pill/Patch Appearance: Markings consistent with prescribed medication Bottle Appearance: Standard pharmacy container. Clearly labeled. Filled Date: 40 / 17 / 2025 Last Medication intake:  Today    UDS:  Summary  Date Value Ref Range Status  10/13/2023 FINAL  Final    Comment:    ==================================================================== [employer] [facility] (MW) ==================================================================== Test                             Result       Flag       Units  Drug Present and Declared for Prescription Verification   Hydrocodone                     466          EXPECTED   ng/mg creat   Hydromorphone                   22           EXPECTED   ng/mg creat   Dihydrocodeine                 56           EXPECTED   ng/mg creat   Norhydrocodone                 846          EXPECTED   ng/mg creat    Sources of hydrocodone  include scheduled prescription medications.    Hydromorphone , dihydrocodeine and norhydrocodone are expected    metabolites of hydrocodone . Hydromorphone  and dihydrocodeine are    also available as scheduled prescription medications.  ==================================================================== Test                      Result    Flag   Units      Ref Range   Creatinine              274              mg/dL      >=79 ==================================================================== Declared Medications:  The flagging and interpretation on this  report are based on the  following declared medications.  Unexpected results may arise from  inaccuracies in the declared medications.   **Note: The testing scope of this panel includes these medications:   Hydrocodone    **Note: The testing scope of this panel does not include the  following reported medications:   Acetaminophen   Dicyclomine  (Bentyl )  Omeprazole  ==================================================================== For clinical consultation, please call [phone]. ====================================================================     No results found for: CBDTHCR No results found for: D8THCCBX No results found for: D9THCCBX  ROS  Constitutional: Denies any fever or chills Gastrointestinal: No reported hemesis, hematochezia, vomiting, or acute GI distress Musculoskeletal: Chronic abdominal pain Neurological: No reported episodes of acute onset apraxia, aphasia, dysarthria, agnosia, amnesia,  paralysis, loss of coordination, or loss of consciousness  Medication Review  HYDROcodone -acetaminophen , dicyclomine , and omeprazole   History Review  Allergy: [name] is allergic to prednisone. Drug: [name]  reports no history of drug use. Alcohol:  reports no history of alcohol use. Tobacco:  reports that he quit smoking about 8 years ago. His smoking use included cigarettes. He has never used smokeless tobacco. Social: [name]  reports that he quit smoking about 8 years ago. His smoking use included cigarettes. He has never used smokeless tobacco. He reports that he does not drink alcohol and does not use drugs. Medical:  has a past medical history of Abdominal pain (2011), Collagen vascular disease, Diverticulitis, History of hiatal hernia, and Small bowel obstruction (HCC). Surgical: [name]  has a past surgical history that includes Colon resection; Cholecystectomy; arm surgery; loop ileostomy and repair of anastomotic leak at sigmoid colon  (2011); ileostomy takedown (2011); Hemorrhoid surgery (N/A, 01/16/2019); Colonoscopy with propofol  (N/A, 03/05/2019); Esophagogastroduodenoscopy (egd) with propofol  (N/A, 03/05/2019); and Colon surgery (2011). Family: family history includes Heart disease in his father and mother.  Laboratory Chemistry Profile   Renal Lab Results  Component Value Date   BUN 18 02/12/2022   CREATININE 1.00 02/12/2022   BCR 10 12/20/2019   GFRAA 79 12/20/2019   GFRNONAA >60 02/12/2022    Hepatic Lab Results  Component Value Date   AST 24 02/12/2022   ALT 18 02/12/2022   ALBUMIN 4.3 02/12/2022   ALKPHOS 46 02/12/2022   AMYLASE 49 09/02/2007   LIPASE 29 02/12/2022    Electrolytes Lab Results  Component Value Date   NA 140 02/12/2022   K 3.6 02/12/2022   CL 105 02/12/2022   CALCIUM 9.5 02/12/2022   MG 2.0 12/02/2014   PHOS 2.4 (L) 12/02/2014    Bone Lab Results  Component Value Date   VD25OH 24.9 (L) 12/20/2019    Inflammation (CRP: Acute Phase) (ESR: Chronic Phase) Lab Results  Component Value Date   CRP <1 02/28/2019   LATICACIDVEN 1.8 03/14/2020         Note: Above Lab results reviewed.  Recent Imaging Review  CT ABDOMEN PELVIS W CONTRAST CLINICAL DATA:  Generalized abdominal pain which is acute since last night. History of small-bowel obstructions.  EXAM: CT ABDOMEN AND PELVIS WITH CONTRAST  TECHNIQUE: Multidetector CT imaging of the abdomen and pelvis was performed using the standard protocol following bolus administration of intravenous contrast.  RADIATION DOSE REDUCTION: This exam was performed according to the departmental dose-optimization program which includes automated exposure control, adjustment of the mA and/or kV according to patient size and/or use of iterative reconstruction technique.  CONTRAST:  OMNIPAQUE  IOHEXOL  300 MG/ML  SOLN  COMPARISON:  03/14/2020  FINDINGS: Lower chest: Lung bases are normal. Possible small sliding  hiatal hernia.  Hepatobiliary: Previous cholecystectomy. Liver and biliary tree are normal.  Pancreas: Normal.  Spleen: Normal.  Adrenals/Urinary Tract: Adrenal glands are normal. Kidneys are normal size without focal mass, hydronephrosis or nephrolithiasis. Ureters and bladder are normal.  Stomach/Bowel: Possible small sliding hiatal hernia as the stomach is otherwise unremarkable.  Small bowel demonstrates a surgical suture line over the ileum in the right lower quadrant. There is a second suture line over a loop of small bowel also in the right lower quadrant more towards the midline as this loop of bowel is chronically dilated and measures 4 cm in diameter. There is an abrupt transition of this chronic dilated postsurgical loop to a normal caliber  segment of ileum in the right lower quadrant. There is interval worsening of fluid-filled dilated small bowel loops in the pelvis measuring up to 3.4 cm in diameter. There is an abrupt transition of normal caliber jejunal loops to this more distally dilated segment in the left mid to lower abdomen. There is wall thickening of the small bowel segment just proximal to these dilated small bowel loops and abrupt transition. Findings may be due to an underlying enteritis. Colon is unremarkable. Appendix is normal.  Vascular/Lymphatic: Minimal calcified plaque over the abdominal aorta which is normal in caliber. No significant adenopathy.  Reproductive: Normal.  Other: Tiny amount of free fluid in the pelvis. No free peritoneal air.  Musculoskeletal: No focal abnormality.  IMPRESSION: 1. Evidence of previous small-bowel surgery in the right lower quadrant. There is interval worsening of fluid-filled dilated small bowel loops in the pelvis measuring up to 3.4 cm in diameter. There is wall thickening of a short segment of small bowel in the left mid to lower abdomen just proximal to these dilated small bowel loops. Findings may  be due to early/partial small bowel obstruction and may be related to an underlying enteritis. Tiny amount of free fluid in the pelvis. 2. Possible small sliding hiatal hernia. 3. Aortic atherosclerosis.  Aortic Atherosclerosis (ICD10-I70.0).  Electronically Signed   By: Toribio Agreste M.D.   On: 02/12/2022 08:31 Note: Reviewed        Physical Exam  Vitals: BP (!) 142/92 (BP Location: Right Arm, Patient Position: Sitting)   Pulse (!) 58   Temp (!) 97.4 F (36.3 C) (Temporal)   Resp 18   Ht 6' (1.829 m)   Wt 175 lb (79.4 kg)   SpO2 99%   BMI 23.73 kg/m  BMI: Estimated body mass index is 23.73 kg/m as calculated from the following:   Height as of this encounter: 6' (1.829 m).   Weight as of this encounter: 175 lb (79.4 kg). Ideal: Ideal body weight: 77.6 kg (171 lb 1.2 oz) Adjusted ideal body weight: 78.3 kg (172 lb 10.3 oz) General appearance: Well nourished, well developed, and well hydrated. In no apparent acute distress Mental status: Alert, oriented x 3 (person, place, & time)       Respiratory: No evidence of acute respiratory distress Eyes: PERLA  Musculoskeletal: Generalized abdominal pain Assessment   Diagnosis Status  1. Chronic pain syndrome   2. Diverticular disease of colon   3. History of abdominal surgery   4. Generalized abdominal pain   5. Medication management    Controlled Controlled Controlled   Updated Problems: No problems updated.  Plan of Care  Problem-specific:  Assessment and Plan    Chronic abdominal pain Chronic abdominal pain managed with hydrocodone /acetaminophen , reducing pain severity and improving functionality. No adverse reactions; increased bowel movements noted. Differential diagnosis includes Crohn's disease, diverticulitis, diverticulosis, and IBS, but no definitive diagnosis established. - Continue hydrocodone -acetaminophen  (Norco 5-325) three times daily. - Schedule follow-up appointment in three months.   Chronic pain  syndrome: Patient's pain is well-controlled with hydrocodone , we will continue on current, medication regimen.  Scribing drug monitoring (PDMP) reviewed, findings consistent with the use of prescribed medication and no evidence of narcotic misuse or abuse.  No side effects or adverse reaction reported to medication.  Schedule follow-up in 90 days for medication management.  Generalized abdominal pain: Medications   HYDROcodone -acetaminophen  (NORCO/VICODIN) 5-325 MG tablet    Sig: Take 1 tablet by mouth 3 (three) times daily as needed for severe  pain (pain score 7-10). Must last 30 days    Dispense:  90 tablet    Refill:  0    Chronic Pain: STOP Act (Not applicable) Fill 1 day early if closed on refill date. Avoid benzodiazepines within 8 hours of opioids   HYDROcodone -acetaminophen  (NORCO/VICODIN) 5-325 MG tablet    Sig: Take 1 tablet by mouth 3 (three) times daily as needed for severe pain (pain score 7-10). Must last 30 days    Dispense:  90 tablet    Refill:  0    Chronic Pain: STOP Act (Not applicable) Fill 1 day early if closed on refill date. Avoid benzodiazepines within 8 hours of opioids   HYDROcodone -acetaminophen  (NORCO/VICODIN) 5-325 MG tablet    Sig: Take 1 tablet by mouth 3 (three) times daily as needed for severe pain (pain score 7-10). Must last 30 days    Dispense:  90 tablet    Refill:  0    Chronic Pain: STOP Act (Not applicable) Fill 1 day early if closed on refill date. Avoid benzodiazepines within 8 hours of opioids        [name] has a current medication list which includes the following long-term medication(s): dicyclomine  and omeprazole .  Pharmacotherapy (Medications Ordered): Meds ordered this encounter  Medications   HYDROcodone -acetaminophen  (NORCO/VICODIN) 5-325 MG tablet    Sig: Take 1 tablet by mouth 3 (three) times daily as needed for severe pain (pain score 7-10). Must last 30 days    Dispense:  90 tablet    Refill:  0    Chronic Pain: STOP  Act (Not applicable) Fill 1 day early if closed on refill date. Avoid benzodiazepines within 8 hours of opioids   HYDROcodone -acetaminophen  (NORCO/VICODIN) 5-325 MG tablet    Sig: Take 1 tablet by mouth 3 (three) times daily as needed for severe pain (pain score 7-10). Must last 30 days    Dispense:  90 tablet    Refill:  0    Chronic Pain: STOP Act (Not applicable) Fill 1 day early if closed on refill date. Avoid benzodiazepines within 8 hours of opioids   HYDROcodone -acetaminophen  (NORCO/VICODIN) 5-325 MG tablet    Sig: Take 1 tablet by mouth 3 (three) times daily as needed for severe pain (pain score 7-10). Must last 30 days    Dispense:  90 tablet    Refill:  0    Chronic Pain: STOP Act (Not applicable) Fill 1 day early if closed on refill date. Avoid benzodiazepines within 8 hours of opioids   Orders:  No orders of the defined types were placed in this encounter.     Return in about 3 months (around 04/10/2024) for (F2F), (MM), Emmy Blanch NP.    Recent Visits Date Type Provider Dept  10/13/23 Office Visit Ramirez Fullbright K, NP [facility]  Showing recent visits within past 90 days and meeting all other requirements Today's Visits Date Type Provider Dept  01/09/24 Office Visit Sydney Azure K, NP [facility]  Showing today's visits and meeting all other requirements Future Appointments Date Type Provider Dept  04/04/24 Appointment Ashyla Luth K, NP [facility]  Showing future appointments within next 90 days and meeting all other requirements  I discussed the assessment and treatment plan with the patient. The patient was provided an opportunity to ask questions and all were answered. The patient agreed with the plan and demonstrated an understanding of the instructions.  Patient advised to call back or seek an in-person evaluation  if the symptoms or condition worsens.  I personally spent a total of 30 minutes in the care of the patient today  including preparing to see the patient, getting/reviewing separately obtained history, performing a medically appropriate exam/evaluation, counseling and educating, placing orders, referring and communicating with other health care professionals, documenting clinical information in the EHR, independently interpreting results, communicating results, and coordinating care.   Note by: Derl Abalos K Yarnell Kozloski, NP (TTS and AI technology used. I apologize for any typographical errors that were not detected and corrected.) Date: 01/09/2024; Time: 3:25 PM

## 2024-04-04 ENCOUNTER — Ambulatory Visit: Attending: Nurse Practitioner | Admitting: Nurse Practitioner

## 2024-04-04 ENCOUNTER — Encounter: Payer: Self-pay | Admitting: Nurse Practitioner

## 2024-04-04 VITALS — BP 137/85 | HR 70 | Temp 98.2°F | Ht 72.0 in | Wt 175.0 lb

## 2024-04-04 DIAGNOSIS — Z9889 Other specified postprocedural states: Secondary | ICD-10-CM | POA: Diagnosis not present

## 2024-04-04 DIAGNOSIS — K5730 Diverticulosis of large intestine without perforation or abscess without bleeding: Secondary | ICD-10-CM | POA: Insufficient documentation

## 2024-04-04 DIAGNOSIS — G894 Chronic pain syndrome: Secondary | ICD-10-CM | POA: Insufficient documentation

## 2024-04-04 DIAGNOSIS — R1084 Generalized abdominal pain: Secondary | ICD-10-CM | POA: Insufficient documentation

## 2024-04-04 DIAGNOSIS — R1032 Left lower quadrant pain: Secondary | ICD-10-CM | POA: Insufficient documentation

## 2024-04-04 DIAGNOSIS — Z0289 Encounter for other administrative examinations: Secondary | ICD-10-CM | POA: Insufficient documentation

## 2024-04-04 DIAGNOSIS — K5900 Constipation, unspecified: Secondary | ICD-10-CM | POA: Insufficient documentation

## 2024-04-04 DIAGNOSIS — R112 Nausea with vomiting, unspecified: Secondary | ICD-10-CM | POA: Insufficient documentation

## 2024-04-04 DIAGNOSIS — R194 Change in bowel habit: Secondary | ICD-10-CM | POA: Insufficient documentation

## 2024-04-04 DIAGNOSIS — Z79899 Other long term (current) drug therapy: Secondary | ICD-10-CM | POA: Diagnosis not present

## 2024-04-04 MED ORDER — HYDROCODONE-ACETAMINOPHEN 5-325 MG PO TABS: 1.0000 | ORAL_TABLET | Freq: Three times a day (TID) | ORAL | 0 refills | Status: AC | PRN

## 2024-04-04 NOTE — Progress Notes (Signed)
 PROVIDER NOTE: Interpretation of information contained herein should be left to medically-trained personnel. Specific patient instructions are provided elsewhere under Patient Instructions section of medical record. This document was created in part using AI and STT-dictation technology, any transcriptional errors that may result from this process are unintentional.  Patient: [name]  Service: E/M   PCP: Patient, No Pcp Per  DOB: 09-21-1970  DOS: 04/04/2024  Provider: Emmy MARLA Blanch, NP  MRN: [id number]  Delivery: Face-to-face  Specialty: Interventional Pain Management  Type: Established Patient  Setting: Ambulatory outpatient facility  Specialty designation: 09  Referring Prov.: No ref. provider found  Location: Outpatient office facility       History of present illness (HPI) [name], a 54 y.o. year old male, is here today because of his Generalized abdominal pain [R10.84]. Mr. Sartin primary complain today is Abdominal Pain (lower)  Pertinent problems: Mr. Belvedere has Gastroesophageal reflux disease with esophagitis without hemorrhage; History of abdominal surgery; Generalized abdominal pain; Chronic pain syndrome; Diverticular disease of colon; Pain management contract signed; and Medication management on their pertinent problem list.  Pain Assessment: Severity of Chronic pain is reported as a 8 /10. Location: Abdomen Medial/Denies. Onset: More than a month ago. Quality: Aching, Burning, Constant. Timing: Constant. Modifying factor(s): meds. Vitals:  height is 6' (1.829 m) and weight is 175 lb (79.4 kg). His temperature is 98.2 F (36.8 C). His blood pressure is 137/85 and his pulse is 70. His oxygen saturation is 98%.  BMI: Estimated body mass index is 23.73 kg/m as calculated from the following:   Height as of this encounter: 6' (1.829 m).   Weight as of this encounter: 175 lb (79.4 kg).  Last encounter: 01/09/2024. Last procedure: Visit date not found.  Reason for  encounter: medication management. No change in medical history since last visit.  Patient's pain is at baseline.  Patient continues multimodal pain regimen as prescribed.  States that it provides pain relief and improvement in functional status.   Discussed the use of AI scribe software for clinical note transcription with the patient, who gave verbal consent to proceed.  History of Present Illness   [name] is a 54 year old male who presents for medication management of chronic abdominal issues.  He reports chronic abdominal issues and states that his surgeon told him these issues will persist for life. He manages the condition with medication.   He takes hydrocodone /acetaminophen  5-325 mg daily without side effects. His last dose was in the morning, and he plans to take another dose after the visit due to experiencing pain and sweating.  He has approximately 38 pills remaining and is attentive to the timing of his prescription refills, often confirming with the pharmacy when he can pick up his next prescription.     Pharmacotherapy Assessment   Hydrocodone -acetaminophen  (Norco/Vicodin) 5-325 mg tablet 3 times daily as needed for pain. MME=15   Monitoring: [place] PMP: PDMP reviewed during this encounter.       Pharmacotherapy: No side-effects or adverse reactions reported. Compliance: No problems identified. Effectiveness: Clinically acceptable.  Delores Dorothe LABOR, RN  04/04/2024  1:34 PM  Sign when Signing Visit Nursing Pain Medication Assessment:  Safety precautions to be maintained throughout the outpatient stay will include: orient to surroundings, keep bed in low position, maintain call bell within reach at all times, provide assistance with transfer out of bed and ambulation.  Medication Inspection Compliance: Pill count conducted under aseptic conditions, in front of the  patient. Neither the pills nor the bottle was removed from the patient's sight at any time. Once count was completed  pills were immediately returned to the patient in their original bottle.  Medication: Hydrocodone /APAP Pill/Patch Count: 38 of 90 pills/patches remain Pill/Patch Appearance: Markings consistent with prescribed medication Bottle Appearance: Standard pharmacy container. Clearly labeled. Filled Date: 64 / 27 / 2025 Last Medication intake:  TodaySafety precautions to be maintained throughout the outpatient stay will include: orient to surroundings, keep bed in low position, maintain call bell within reach at all times, provide assistance with transfer out of bed and ambulation.     UDS:  Summary  Date Value Ref Range Status  10/13/2023 FINAL  Final    Comment:    ==================================================================== [employer] [facility] (MW) ==================================================================== Test                             Result       Flag       Units  Drug Present and Declared for Prescription Verification   Hydrocodone                     466          EXPECTED   ng/mg creat   Hydromorphone                   22           EXPECTED   ng/mg creat   Dihydrocodeine                 56           EXPECTED   ng/mg creat   Norhydrocodone                 846          EXPECTED   ng/mg creat    Sources of hydrocodone  include scheduled prescription medications.    Hydromorphone , dihydrocodeine and norhydrocodone are expected    metabolites of hydrocodone . Hydromorphone  and dihydrocodeine are    also available as scheduled prescription medications.  ==================================================================== Test                      Result    Flag   Units      Ref Range   Creatinine              274              mg/dL      >=79 ==================================================================== Declared Medications:  The flagging and interpretation on this report are based on the  following declared medications.  Unexpected results may arise from   inaccuracies in the declared medications.   **Note: The testing scope of this panel includes these medications:   Hydrocodone    **Note: The testing scope of this panel does not include the  following reported medications:   Acetaminophen   Dicyclomine  (Bentyl )  Omeprazole  ==================================================================== For clinical consultation, please call [phone]. ====================================================================     No results found for: CBDTHCR No results found for: D8THCCBX No results found for: D9THCCBX  ROS  Constitutional: Denies any fever or chills Gastrointestinal: No reported hemesis, hematochezia, vomiting, or acute GI distress Musculoskeletal: Chronic abdominal pain  Neurological: No reported episodes of acute onset apraxia, aphasia, dysarthria, agnosia, amnesia, paralysis, loss of coordination, or loss of consciousness  Medication Review  HYDROcodone -acetaminophen   History Review  Allergy: [name] is allergic  to prednisone. Drug: [name]  reports no history of drug use. Alcohol:  reports no history of alcohol use. Tobacco:  reports that he quit smoking about 9 years ago. His smoking use included cigarettes. He smoked an average of 2 packs per day. He has never used smokeless tobacco. Social: [name]  reports that he quit smoking about 9 years ago. His smoking use included cigarettes. He smoked an average of 2 packs per day. He has never used smokeless tobacco. He reports that he does not drink alcohol and does not use drugs. Medical:  has a past medical history of Abdominal pain (2011), Collagen vascular disease, Diverticulitis, History of hiatal hernia, and Small bowel obstruction (HCC). Surgical: Mr. Specht  has a past surgical history that includes Colon resection; Cholecystectomy; arm surgery; loop ileostomy and repair of anastomotic leak at sigmoid colon (2011); ileostomy takedown (2011); Hemorrhoid  surgery (N/A, 01/16/2019); Colonoscopy with propofol  (N/A, 03/05/2019); Esophagogastroduodenoscopy (egd) with propofol  (N/A, 03/05/2019); and Colon surgery (2011). Family: family history includes Heart disease in his father and mother.  Laboratory Chemistry Profile   Renal Lab Results  Component Value Date   BUN 18 02/12/2022   CREATININE 1.00 02/12/2022   BCR 10 12/20/2019   GFRAA 79 12/20/2019   GFRNONAA >60 02/12/2022    Hepatic Lab Results  Component Value Date   AST 24 02/12/2022   ALT 18 02/12/2022   ALBUMIN 4.3 02/12/2022   ALKPHOS 46 02/12/2022   AMYLASE 49 09/02/2007   LIPASE 29 02/12/2022    Electrolytes Lab Results  Component Value Date   NA 140 02/12/2022   K 3.6 02/12/2022   CL 105 02/12/2022   CALCIUM 9.5 02/12/2022   MG 2.0 12/02/2014   PHOS 2.4 (L) 12/02/2014    Bone Lab Results  Component Value Date   VD25OH 24.9 (L) 12/20/2019    Inflammation (CRP: Acute Phase) (ESR: Chronic Phase) Lab Results  Component Value Date   CRP <1 02/28/2019   LATICACIDVEN 1.8 03/14/2020         Note: Above Lab results reviewed.  Recent Imaging Review  CT ABDOMEN PELVIS W CONTRAST CLINICAL DATA:  Generalized abdominal pain which is acute since last night. History of small-bowel obstructions.  EXAM: CT ABDOMEN AND PELVIS WITH CONTRAST  TECHNIQUE: Multidetector CT imaging of the abdomen and pelvis was performed using the standard protocol following bolus administration of intravenous contrast.  RADIATION DOSE REDUCTION: This exam was performed according to the departmental dose-optimization program which includes automated exposure control, adjustment of the mA and/or kV according to patient size and/or use of iterative reconstruction technique.  CONTRAST:  OMNIPAQUE  IOHEXOL  300 MG/ML  SOLN  COMPARISON:  03/14/2020  FINDINGS: Lower chest: Lung bases are normal. Possible small sliding hiatal hernia.  Hepatobiliary: Previous cholecystectomy. Liver  and biliary tree are normal.  Pancreas: Normal.  Spleen: Normal.  Adrenals/Urinary Tract: Adrenal glands are normal. Kidneys are normal size without focal mass, hydronephrosis or nephrolithiasis. Ureters and bladder are normal.  Stomach/Bowel: Possible small sliding hiatal hernia as the stomach is otherwise unremarkable.  Small bowel demonstrates a surgical suture line over the ileum in the right lower quadrant. There is a second suture line over a loop of small bowel also in the right lower quadrant more towards the midline as this loop of bowel is chronically dilated and measures 4 cm in diameter. There is an abrupt transition of this chronic dilated postsurgical loop to a normal caliber segment of ileum in the right lower  quadrant. There is interval worsening of fluid-filled dilated small bowel loops in the pelvis measuring up to 3.4 cm in diameter. There is an abrupt transition of normal caliber jejunal loops to this more distally dilated segment in the left mid to lower abdomen. There is wall thickening of the small bowel segment just proximal to these dilated small bowel loops and abrupt transition. Findings may be due to an underlying enteritis. Colon is unremarkable. Appendix is normal.  Vascular/Lymphatic: Minimal calcified plaque over the abdominal aorta which is normal in caliber. No significant adenopathy.  Reproductive: Normal.  Other: Tiny amount of free fluid in the pelvis. No free peritoneal air.  Musculoskeletal: No focal abnormality.  IMPRESSION: 1. Evidence of previous small-bowel surgery in the right lower quadrant. There is interval worsening of fluid-filled dilated small bowel loops in the pelvis measuring up to 3.4 cm in diameter. There is wall thickening of a short segment of small bowel in the left mid to lower abdomen just proximal to these dilated small bowel loops. Findings may be due to early/partial small bowel obstruction and may be related  to an underlying enteritis. Tiny amount of free fluid in the pelvis. 2. Possible small sliding hiatal hernia. 3. Aortic atherosclerosis.  Aortic Atherosclerosis (ICD10-I70.0).  Electronically Signed   By: Toribio Agreste M.D.   On: 02/12/2022 08:31 Note: Reviewed        Physical Exam  Vitals: BP 137/85   Pulse 70   Temp 98.2 F (36.8 C)   Ht 6' (1.829 m)   Wt 175 lb (79.4 kg)   SpO2 98%   BMI 23.73 kg/m  BMI: Estimated body mass index is 23.73 kg/m as calculated from the following:   Height as of this encounter: 6' (1.829 m).   Weight as of this encounter: 175 lb (79.4 kg). Ideal: Ideal body weight: 77.6 kg (171 lb 1.2 oz) Adjusted ideal body weight: 78.3 kg (172 lb 10.3 oz) General appearance: Well nourished, well developed, and well hydrated. In no apparent acute distress Mental status: Alert, oriented x 3 (person, place, & time)       Respiratory: No evidence of acute respiratory distress Eyes: PERLA   Musculoskeletal: Generalized abdominal pain  Assessment   Diagnosis Status  1. Generalized abdominal pain   2. Medication management   3. Chronic pain syndrome   4. Diverticular disease of colon   5. History of abdominal surgery   6. Pain management contract signed    Controlled Controlled Controlled   Updated Problems: Problem  Abdominal Pain, Llq  Change in Bowel Habits  Constipation, Unspecified  Nausea With Vomiting    Plan of Care  Problem-specific:  Assessment and Plan    Chronic pain syndrome Managed with hydrocodone  and acetaminophen . No side effects reported. Pain significant, requires daily medication. - Continue hydrocodone  and acetaminophen  regimen. - Sent prescription to pharmacy.  Generalized abdominal pain secondary to post-abdominal surgery Chronic pain persists post-surgery, managed with medication. No new surgical interventions planned.  Medication management for chronic pain Current regimen effective without side effects.  Prescription dates and pharmacy communication issues discussed. - Ensured prescription dates are communicated clearly to him. - Advised him to contact pharmacy for prescription pickup dates.  Patient's pain is controlled with hydrocodone , we will continue on current, medication regimen.  Scribing drug monitoring (PDMP) reviewed, findings consistent with the use of prescribed medication and no evidence of narcotic misuse or abuse.  No side effects or adverse reaction reported to medication.  Schedule follow-up in  90 days for medication management.      [name] is not on any long-term medications.  Pharmacotherapy (Medications Ordered): Meds ordered this encounter  Medications   HYDROcodone -acetaminophen  (NORCO/VICODIN) 5-325 MG tablet    Sig: Take 1 tablet by mouth 3 (three) times daily as needed for severe pain (pain score 7-10). Must last 30 days    Dispense:  90 tablet    Refill:  0    Chronic Pain: STOP Act (Not applicable) Fill 1 day early if closed on refill date. Avoid benzodiazepines within 8 hours of opioids   HYDROcodone -acetaminophen  (NORCO/VICODIN) 5-325 MG tablet    Sig: Take 1 tablet by mouth 3 (three) times daily as needed for severe pain (pain score 7-10). Must last 30 days    Dispense:  90 tablet    Refill:  0    Chronic Pain: STOP Act (Not applicable) Fill 1 day early if closed on refill date. Avoid benzodiazepines within 8 hours of opioids   HYDROcodone -acetaminophen  (NORCO/VICODIN) 5-325 MG tablet    Sig: Take 1 tablet by mouth 3 (three) times daily as needed for severe pain (pain score 7-10). Must last 30 days    Dispense:  90 tablet    Refill:  0    Chronic Pain: STOP Act (Not applicable) Fill 1 day early if closed on refill date. Avoid benzodiazepines within 8 hours of opioids   Orders:  No orders of the defined types were placed in this encounter.       Return in about 3 months (around 07/03/2024) for (F2F), (MM), Emmy Blanch NP.    Recent  Visits Date Type Provider Dept  01/09/24 Office Visit Candis Kabel K, NP [facility]  Showing recent visits within past 90 days and meeting all other requirements Today's Visits Date Type Provider Dept  04/04/24 Office Visit Ajanee Buren K, NP [facility]  Showing today's visits and meeting all other requirements Future Appointments Date Type Provider Dept  06/27/24 Appointment Yicel Shannon K, NP [facility]  Showing future appointments within next 90 days and meeting all other requirements  I discussed the assessment and treatment plan with the patient. The patient was provided an opportunity to ask questions and all were answered. The patient agreed with the plan and demonstrated an understanding of the instructions.  Patient advised to call back or seek an in-person evaluation if the symptoms or condition worsens.  I personally spent a total of 30 minutes in the care of the patient today including preparing to see the patient, getting/reviewing separately obtained history, performing a medically appropriate exam/evaluation, counseling and educating, placing orders, referring and communicating with other health care professionals, documenting clinical information in the EHR, independently interpreting results, communicating results, and coordinating care.   Note by: Hideo Googe K Wilhelm Ganaway, NP (TTS and AI technology used. I apologize for any typographical errors that were not detected and corrected.) Date: 04/04/2024; Time: 2:02 PM

## 2024-04-04 NOTE — Patient Instructions (Signed)

## 2024-04-04 NOTE — Progress Notes (Signed)
 Nursing Pain Medication Assessment:  Safety precautions to be maintained throughout the outpatient stay will include: orient to surroundings, keep bed in low position, maintain call bell within reach at all times, provide assistance with transfer out of bed and ambulation.  Medication Inspection Compliance: Pill count conducted under aseptic conditions, in front of the patient. Neither the pills nor the bottle was removed from the patient's sight at any time. Once count was completed pills were immediately returned to the patient in their original bottle.  Medication: Hydrocodone /APAP Pill/Patch Count: 38 of 90 pills/patches remain Pill/Patch Appearance: Markings consistent with prescribed medication Bottle Appearance: Standard pharmacy container. Clearly labeled. Filled Date: 21 / 27 / 2025 Last Medication intake:  TodaySafety precautions to be maintained throughout the outpatient stay will include: orient to surroundings, keep bed in low position, maintain call bell within reach at all times, provide assistance with transfer out of bed and ambulation.

## 2024-06-27 ENCOUNTER — Encounter: Admitting: Nurse Practitioner
